# Patient Record
Sex: Male | Born: 1945 | Race: White | Hispanic: No | Marital: Married | Smoking: Current some day smoker
Health system: Southern US, Community
[De-identification: ages and names within clinical notes are randomized; demographics above are authoritative.]

## PROBLEM LIST (undated history)

## (undated) DIAGNOSIS — I2510 Atherosclerotic heart disease of native coronary artery without angina pectoris: Secondary | ICD-10-CM

## (undated) DIAGNOSIS — I4891 Unspecified atrial fibrillation: Secondary | ICD-10-CM

## (undated) DIAGNOSIS — K219 Gastro-esophageal reflux disease without esophagitis: Secondary | ICD-10-CM

## (undated) DIAGNOSIS — Z9981 Dependence on supplemental oxygen: Secondary | ICD-10-CM

## (undated) DIAGNOSIS — I10 Essential (primary) hypertension: Secondary | ICD-10-CM

## (undated) DIAGNOSIS — K649 Unspecified hemorrhoids: Secondary | ICD-10-CM

## (undated) DIAGNOSIS — C801 Malignant (primary) neoplasm, unspecified: Secondary | ICD-10-CM

## (undated) DIAGNOSIS — C3490 Malignant neoplasm of unspecified part of unspecified bronchus or lung: Secondary | ICD-10-CM

## (undated) DIAGNOSIS — Q254 Congenital malformation of aorta unspecified: Secondary | ICD-10-CM

## (undated) DIAGNOSIS — I255 Ischemic cardiomyopathy: Secondary | ICD-10-CM

## (undated) DIAGNOSIS — I509 Heart failure, unspecified: Secondary | ICD-10-CM

## (undated) DIAGNOSIS — J449 Chronic obstructive pulmonary disease, unspecified: Secondary | ICD-10-CM

## (undated) DIAGNOSIS — I82409 Acute embolism and thrombosis of unspecified deep veins of unspecified lower extremity: Secondary | ICD-10-CM

## (undated) DIAGNOSIS — I739 Peripheral vascular disease, unspecified: Secondary | ICD-10-CM

## (undated) DIAGNOSIS — I639 Cerebral infarction, unspecified: Secondary | ICD-10-CM

## (undated) DIAGNOSIS — I499 Cardiac arrhythmia, unspecified: Secondary | ICD-10-CM

## (undated) DIAGNOSIS — Z8679 Personal history of other diseases of the circulatory system: Secondary | ICD-10-CM

## (undated) HISTORY — DX: Malignant neoplasm of unspecified part of unspecified bronchus or lung: C34.90

## (undated) HISTORY — DX: Heart failure, unspecified: I50.9

## (undated) HISTORY — DX: Acute embolism and thrombosis of unspecified deep veins of unspecified lower extremity: I82.409

## (undated) HISTORY — DX: Unspecified hemorrhoids: K64.9

## (undated) HISTORY — DX: Dependence on supplemental oxygen: Z99.81

## (undated) HISTORY — DX: Unspecified atrial fibrillation: I48.91

## (undated) HISTORY — DX: Gastro-esophageal reflux disease without esophagitis: K21.9

## (undated) HISTORY — DX: Personal history of other diseases of the circulatory system: Z86.79

## (undated) HISTORY — DX: Atherosclerotic heart disease of native coronary artery without angina pectoris: I25.10

## (undated) HISTORY — PX: INSERT / REPLACE / REMOVE PACEMAKER: SUR710

## (undated) HISTORY — DX: Chronic obstructive pulmonary disease, unspecified: J44.9

## (undated) HISTORY — PX: PACEMAKER PLACEMENT: SHX43

---

## 2008-03-18 DIAGNOSIS — K5790 Diverticulosis of intestine, part unspecified, without perforation or abscess without bleeding: Secondary | ICD-10-CM

## 2008-03-18 HISTORY — DX: Diverticulosis of intestine, part unspecified, without perforation or abscess without bleeding: K57.90

## 2011-03-19 HISTORY — PX: CORONARY ANGIOGRAM: SHX5786

## 2017-12-06 ENCOUNTER — Emergency Department (HOSPITAL_COMMUNITY): Payer: Medicare HMO

## 2017-12-06 ENCOUNTER — Emergency Department (HOSPITAL_COMMUNITY)
Admission: EM | Admit: 2017-12-06 | Discharge: 2017-12-06 | Disposition: A | Discharge status: Home / Self Care | Payer: Medicare HMO | Attending: Emergency Medicine | Admitting: Emergency Medicine

## 2017-12-06 DIAGNOSIS — Z87891 Personal history of nicotine dependence: Secondary | ICD-10-CM | POA: Insufficient documentation

## 2017-12-06 DIAGNOSIS — J189 Pneumonia, unspecified organism: Secondary | ICD-10-CM | POA: Diagnosis not present

## 2017-12-06 DIAGNOSIS — I69128 Other speech and language deficits following nontraumatic intracerebral hemorrhage: Secondary | ICD-10-CM | POA: Insufficient documentation

## 2017-12-06 DIAGNOSIS — C3490 Malignant neoplasm of unspecified part of unspecified bronchus or lung: Secondary | ICD-10-CM | POA: Diagnosis not present

## 2017-12-06 DIAGNOSIS — Z950 Presence of cardiac pacemaker: Secondary | ICD-10-CM | POA: Insufficient documentation

## 2017-12-06 DIAGNOSIS — M545 Low back pain: Secondary | ICD-10-CM | POA: Diagnosis present

## 2017-12-06 DIAGNOSIS — J449 Chronic obstructive pulmonary disease, unspecified: Secondary | ICD-10-CM | POA: Diagnosis not present

## 2017-12-06 DIAGNOSIS — Z7901 Long term (current) use of anticoagulants: Secondary | ICD-10-CM | POA: Insufficient documentation

## 2017-12-06 DIAGNOSIS — J181 Lobar pneumonia, unspecified organism: Secondary | ICD-10-CM

## 2017-12-06 LAB — BASIC METABOLIC PANEL
Anion gap: 14 (ref 5–15)
BUN: 16 mg/dL (ref 8–23)
CALCIUM: 9.3 mg/dL (ref 8.9–10.3)
CO2: 25 mmol/L (ref 22–32)
Chloride: 93 mmol/L — ABNORMAL LOW (ref 98–111)
Creatinine, Ser: 1.42 mg/dL — ABNORMAL HIGH (ref 0.61–1.24)
GFR calc Af Amer: 55 mL/min — ABNORMAL LOW (ref >60)
GFR calc non Af Amer: 47 mL/min — ABNORMAL LOW (ref >60)
Glucose, Bld: 155 mg/dL — ABNORMAL HIGH (ref 70–99)
Potassium: 4.2 mmol/L (ref 3.5–5.1)
Sodium: 132 mmol/L — ABNORMAL LOW (ref 135–145)

## 2017-12-06 LAB — PROTIME-INR
INR: 1.51
Prothrombin Time: 18.1 seconds — ABNORMAL HIGH (ref 11.4–15.2)

## 2017-12-06 LAB — CBC
HCT: 38.4 % — ABNORMAL LOW (ref 39.0–52.0)
Hemoglobin: 12.3 g/dL — ABNORMAL LOW (ref 13.0–17.0)
MCH: 30.4 pg (ref 26.0–34.0)
MCHC: 32 g/dL (ref 30.0–36.0)
MCV: 95 fL (ref 78.0–100.0)
Platelets: 216 10*3/uL (ref 150–400)
RBC: 4.04 MIL/uL — AB (ref 4.22–5.81)
RDW: 13.3 % (ref 11.5–15.5)
WBC: 11.4 10*3/uL — ABNORMAL HIGH (ref 4.0–10.5)

## 2017-12-06 LAB — I-STAT TROPONIN, ED: TROPONIN I, POC: 0 ng/mL (ref 0.00–0.08)

## 2017-12-06 LAB — D-DIMER, QUANTITATIVE: D-Dimer, Quant: 1.62 ug/mL-FEU — ABNORMAL HIGH (ref 0.00–0.50)

## 2017-12-06 MED ORDER — ONDANSETRON HCL 4 MG/2ML IJ SOLN
4.0000 mg | Freq: Once | INTRAMUSCULAR | Status: AC
  Administered 2017-12-06: 4 mg via INTRAVENOUS
  Filled 2017-12-06: qty 2

## 2017-12-06 MED ORDER — OXYCODONE-ACETAMINOPHEN 5-325 MG PO TABS: 1.0000 | ORAL_TABLET | ORAL | 0 refills | Status: DC | PRN

## 2017-12-06 MED ORDER — SODIUM CHLORIDE 0.9 % IV SOLN
1.0000 g | Freq: Once | INTRAVENOUS | Status: AC
  Administered 2017-12-06: 1 g via INTRAVENOUS
  Filled 2017-12-06: qty 10

## 2017-12-06 MED ORDER — SODIUM CHLORIDE 0.9 % IV SOLN: 1.0000 g | Freq: Once | INTRAVENOUS | Status: DC

## 2017-12-06 MED ORDER — AZITHROMYCIN 250 MG PO TABS
500.0000 mg | ORAL_TABLET | Freq: Once | ORAL | Status: AC
  Administered 2017-12-06: 500 mg via ORAL
  Filled 2017-12-06: qty 2

## 2017-12-06 MED ORDER — AZITHROMYCIN 250 MG PO TABS: ORAL_TABLET | ORAL | 0 refills | Status: DC

## 2017-12-06 MED ORDER — IOPAMIDOL (ISOVUE-370) INJECTION 76%
INTRAVENOUS | Status: AC
  Filled 2017-12-06: qty 100

## 2017-12-06 MED ORDER — FENTANYL CITRATE (PF) 100 MCG/2ML IJ SOLN: 50.0000 ug | Freq: Once | INTRAMUSCULAR | Status: DC

## 2017-12-06 MED ORDER — IOPAMIDOL (ISOVUE-370) INJECTION 76%
100.0000 mL | Freq: Once | INTRAVENOUS | Status: AC | PRN
  Administered 2017-12-06: 100 mL via INTRAVENOUS

## 2017-12-06 MED ORDER — HYDROMORPHONE HCL 1 MG/ML IJ SOLN
0.5000 mg | Freq: Once | INTRAMUSCULAR | Status: AC
  Administered 2017-12-06: 0.5 mg via INTRAVENOUS
  Filled 2017-12-06: qty 1

## 2017-12-06 NOTE — ED Provider Notes (Signed)
Medical screening examination/treatment/procedure(s) were conducted as a shared visit with non-physician practitioner(s) and myself.  I personally evaluated the patient during the encounter.  EKG Interpretation  Date/Time:  Saturday December 06 2017 14:19:15 EDT Ventricular Rate:  77 PR Interval:  186 QRS Duration: 114 QT Interval:  464 QTC Calculation: 525 R Axis:   34 Text Interpretation:  Normal sinus rhythm with sinus arrhythmia Anterior infarct , age undetermined ST & Marked T-wave abnormality, consider inferolateral ischemia Prolonged QT No previous tracing Confirmed by Blanchie Dessert [phone]) on 12/06/2017 2:30:07 PM Has history of lung cancer.  Reports he has had severe pain in his left lower rib cage for several days.  He recently had a long car ride.  He is compliant with his Eliquis.  Patient has cough.  Pain is severe with cough or certain movements.  Patient is alert and appropriate.  Very thin.  Sounds very decreased on the left lower lung fields.  Heart regular.  Status clear.  Skin warm and dry.  CT rules out pulmonary embolus.  Patient does have consolidation in the left lower lung field suggestive of pneumonia.  Plan will be to treat pain that is suggestive of pleurisy.  Also will treat for pneumonia based on CT findings.  Patient is aware that he needs to follow-up with his PCP or oncology to monitor the CT findings and to monitor for any recurrence of lung cancer and resolution of identified consolidation.   Charlesetta Shanks, MD 12/06/17 1807

## 2017-12-06 NOTE — ED Provider Notes (Signed)
[facility] EMERGENCY DEPARTMENT Provider Note   CSN: [id number] Arrival date & time: 12/06/17  1329     History   Chief Complaint Chief Complaint  Patient presents with  . Back Pain    HPI Brett Saunders is a 72 y.o. male presents the emergency department with chief complaint of back pain.  Patient has a past medical history of COPD, CHF, previous stroke with mild residual balance issues and mild residual speech issues he is chronically anticoagulated on Eliquis which he is compliant with.  The patient quit smoking in 2013.  He has known and active lung cancer.  He was treated with a week of chemotherapy and has had steadily decreasing size of the mass in the left lower lobe.  He states that it is a primary and singular lesion without metastases.  The patient states that he moved from [facility] 3 weeks ago.  He has not yet established care with oncology and has a primary care appointment coming up in mid October.  Patient says that they drove across the country to get here it took 5 days.  He had a lot of discomfort during the ride and had progressively worsening pain in his left flank region.  He states that it is much worse when he tries to sit up or stand.  He said it took him 2 hours to get out of bed yesterday.  He was seen at urgent care and given baclofen and sent to the emergency department.  He denies hemoptysis, unilateral leg swelling.  He denies a previous history of PE or DVT.  He denies fevers or chills.  HPI  No past medical history on file.  There are no active problems to display for this patient.    The histories are not reviewed yet. Please review them in the "History" navigator section and refresh this [place].      Home Medications    Prior to Admission medications   Not on File    Family History No family history on file.  Social History Social History   Tobacco Use  . Smoking status: Not on file  Substance Use Topics  .  Alcohol use: Not on file  . Drug use: Not on file     Allergies   Patient has no known allergies.   Review of Systems Review of Systems  Ten systems reviewed and are negative for acute change, except as noted in the HPI.   Physical Exam Updated Vital Signs BP (!) 101/59 (BP Location: Right Arm)   Pulse 78   Temp 97.7 F (36.5 C) (Oral)   Resp 18   SpO2 97%   Physical Exam  Constitutional: He is oriented to person, place, and time. He appears well-developed.  "pink puffer" appearance of chronic COPD. Very thin and protein malnourished appearing  HENT:  Head: Normocephalic and atraumatic.  Eyes: Pupils are equal, round, and reactive to light. Conjunctivae and EOM are normal.  Neck: Normal range of motion. Neck supple. No JVD present.  Cardiovascular: Normal rate and regular rhythm. Exam reveals no friction rub.  No murmur heard. Pacemaker visible in the R upper chest wall   Pulmonary/Chest: Effort normal and breath sounds normal.  Abdominal: Soft. Bowel sounds are normal. He exhibits no distension. There is no tenderness.  No CVA tenderness  Musculoskeletal: He exhibits no edema.  Patient with some dextroscoliosis of the thoracic and lumbar region on examination.  No midline spinal tenderness, no reproducible pain with palpation  of the supporting musculature of the back.  Patient has obvious pain when going from lying to sitting.    Neurological: He is alert and oriented to person, place, and time.  Patient appears to be well informed and have good insight into his medical history and disease.  Skin: Skin is warm and dry.  Nursing note and vitals reviewed.    ED Treatments / Results  Labs (all labs ordered are listed, but only abnormal results are displayed) Labs Reviewed  BASIC METABOLIC PANEL - Abnormal; Notable for the following components:      Result Value   Sodium 132 (*)    Chloride 93 (*)    Glucose, Bld 155 (*)    Creatinine, Ser 1.42 (*)    GFR calc non  Af Amer 47 (*)    GFR calc Af Amer 55 (*)    All other components within normal limits  CBC - Abnormal; Notable for the following components:   WBC 11.4 (*)    RBC 4.04 (*)    Hemoglobin 12.3 (*)    HCT 38.4 (*)    All other components within normal limits  PROTIME-INR - Abnormal; Notable for the following components:   Prothrombin Time 18.1 (*)    All other components within normal limits  D-DIMER, QUANTITATIVE (NOT AT [facility]) - Abnormal; Notable for the following components:   D-Dimer, Quant 1.62 (*)    All other components within normal limits  I-STAT TROPONIN, ED    EKG EKG Interpretation  Date/Time:  Saturday December 06 2017 14:19:15 EDT Ventricular Rate:  77 PR Interval:  186 QRS Duration: 114 QT Interval:  464 QTC Calculation: 525 R Axis:   34 Text Interpretation:  Normal sinus rhythm with sinus arrhythmia Anterior infarct , age undetermined ST & Marked T-wave abnormality, consider inferolateral ischemia Prolonged QT No previous tracing Confirmed by Blanchie Dessert [phone]) on 12/06/2017 2:30:07 PM   Radiology Dg Chest 2 View  Result Date: 12/06/2017 CLINICAL DATA:  Pt states he just moved across country, 10 hour drive. He has pain to bilateral mid back/lower ribs that is worse with deep breaths. He has a hx of afib, COPD, lung cancer and CHF. Denies CP or SOB EXAM: CHEST - 2 VIEW COMPARISON:  None. FINDINGS: Cardiac silhouette is normal in size. No mediastinal or hilar masses. No evidence of adenopathy. Left anterior chest wall AICD appears well positioned. Lungs are hyperexpanded. There is opacity at the left lung base associated with a small effusion. There is scarring in the upper lobes. No other areas of lung consolidation. No pulmonary edema. No pneumothorax. Mild compression deformity of a lower thoracic vertebra, likely chronic. Skeletal structures are diffusely demineralized. IMPRESSION: 1. Left lower lobe consolidation associated with a small effusion. Findings are  consistent with pneumonia in the proper clinical setting. Alternatively, lung opacity may be due to scarring or atelectasis. 2. No other evidence of an acute abnormality. 3. Advanced COPD. Electronically Signed   By: Lajean Manes M.D.   On: 12/06/2017 14:43    Procedures Procedures (including critical care time)  Medications Ordered in ED Medications - No data to display   Initial Impression / Assessment and Plan / ED Course  I have reviewed the triage vital signs and the nursing notes.  Pertinent labs & imaging results that were available during my care of the patient were reviewed by me and considered in my medical decision making (see chart for details).     72 year old male with extensive past  medical history new to the area.  He has known cancer.  D-dimer elevated in the front without other significant abnormality.  Patient appears to have some mild renal insufficiency elevated blood glucose.  White cell count is slightly elevated.  Patient had a negative chest x-ray for any acute abnormalities.  He does have some chronic cardiomegaly seen.  Patient's CT angios shows no pulmonary embolus however he does appear to have left lower lobe pneumonia which is likely the cause of the pain of his symptoms.  No evidence of pathologic fracture or pulmonary embolus.  Patient will be treated with antibiotics and outpatient follow-up.  This is the same region where he was reported to have a cancer and the patient will need outpatient oncologic follow-up.  Patient seen and shared visit with Dr. Vallery Ridge who agrees with work-up and plan for discharge.  I discussed return precautions with the PA patient.  Pain improved with medications here.   Patient appears appropriate for discharge at this time  Final Clinical Impressions(s) / ED Diagnoses   Final diagnoses:  Community acquired pneumonia of left lower lobe of lung [facility])    ED Discharge Orders    None       Margarita Mail, PA-C 12/06/17  2348    Charlesetta Shanks, MD 12/07/17 0000

## 2017-12-06 NOTE — ED Notes (Signed)
Patient Alert and oriented to baseline. Stable and ambulatory to baseline. Patient verbalized understanding of the discharge instructions.  Patient belongings were taken by the patient.   

## 2017-12-06 NOTE — Discharge Instructions (Addendum)
Contact a health care provider if: You have a fever. You are losing sleep because you cannot control your cough with cough medicine. Get help right away if: You have worsening shortness of breath. You have increased chest pain. Your sickness becomes worse, especially if you are an older adult or have a weakened immune system. You cough up blood.

## 2017-12-06 NOTE — ED Triage Notes (Signed)
Pt states he just moved across country, 10 hour drive. He has pain to bilateral mid back/lower ribs that is worse with deep breaths. He has a hx of afib, his skin color is grey. He smells of ETOH. Denies CP or SOB. Denies N/V/D or urinary symptoms.

## 2018-02-26 ENCOUNTER — Telehealth (HOSPITAL_COMMUNITY): Payer: Self-pay | Admitting: *Deleted

## 2018-02-26 NOTE — Telephone Encounter (Signed)
Received [facility] Authorization/Referral ([id number]) dates 02/23/18 to 06/23/2018 for this pt to participate in pulmonary rehab with the diagnosis of COPD. Clinical review of pt follow up appt on 12/6 with Dr Gwenette Greet at [facility]  Pulmonary office note.   Pt appropriate for scheduling for Pulmonary rehab.  Will forward to support staff for scheduling. Cherre Huger, BSN Cardiac and Training and development officer

## 2018-03-06 ENCOUNTER — Telehealth (HOSPITAL_COMMUNITY): Payer: Self-pay

## 2018-03-06 NOTE — Telephone Encounter (Signed)
Called patient to see if he was interested in participating in the Pulmonary Rehab Program. Patient stated yes. Patient will come in for orientation on 03/13/18 @ 930AM and will attend the 1030AM exercise class.  Mailed homework package.

## 2018-03-09 ENCOUNTER — Telehealth (HOSPITAL_COMMUNITY): Payer: Self-pay

## 2018-03-13 ENCOUNTER — Encounter (HOSPITAL_COMMUNITY): Payer: Self-pay

## 2018-03-13 ENCOUNTER — Encounter (HOSPITAL_COMMUNITY)
Admission: RE | Admit: 2018-03-13 | Discharge: 2018-03-13 | Disposition: A | Discharge status: Home / Self Care | Payer: No Typology Code available for payment source | Source: Ambulatory Visit | Attending: Pulmonary Disease | Admitting: Pulmonary Disease

## 2018-03-13 VITALS — BP 103/66 | HR 63 | Temp 97.8°F | Resp 18 | Ht 68.75 in | Wt 117.1 lb

## 2018-03-13 DIAGNOSIS — I255 Ischemic cardiomyopathy: Secondary | ICD-10-CM | POA: Insufficient documentation

## 2018-03-13 DIAGNOSIS — I10 Essential (primary) hypertension: Secondary | ICD-10-CM | POA: Insufficient documentation

## 2018-03-13 DIAGNOSIS — F17210 Nicotine dependence, cigarettes, uncomplicated: Secondary | ICD-10-CM | POA: Insufficient documentation

## 2018-03-13 DIAGNOSIS — J449 Chronic obstructive pulmonary disease, unspecified: Secondary | ICD-10-CM | POA: Insufficient documentation

## 2018-03-13 DIAGNOSIS — I700 Atherosclerosis of aorta: Secondary | ICD-10-CM | POA: Insufficient documentation

## 2018-03-13 DIAGNOSIS — Z79899 Other long term (current) drug therapy: Secondary | ICD-10-CM | POA: Insufficient documentation

## 2018-03-13 DIAGNOSIS — I4891 Unspecified atrial fibrillation: Secondary | ICD-10-CM | POA: Insufficient documentation

## 2018-03-13 DIAGNOSIS — Z7901 Long term (current) use of anticoagulants: Secondary | ICD-10-CM | POA: Insufficient documentation

## 2018-03-13 HISTORY — DX: Chronic obstructive pulmonary disease, unspecified: J44.9

## 2018-03-13 HISTORY — DX: Congenital malformation of aorta unspecified: Q25.40

## 2018-03-13 HISTORY — DX: Essential (primary) hypertension: I10

## 2018-03-13 HISTORY — DX: Peripheral vascular disease, unspecified: I73.9

## 2018-03-13 HISTORY — DX: Ischemic cardiomyopathy: I25.5

## 2018-03-13 HISTORY — DX: Cardiac arrhythmia, unspecified: I49.9

## 2018-03-13 HISTORY — DX: Cerebral infarction, unspecified: I63.9

## 2018-03-13 HISTORY — DX: Malignant (primary) neoplasm, unspecified: C80.1

## 2018-03-13 NOTE — Progress Notes (Addendum)
Brett Saunders 72 y.o. male Pulmonary Rehab Orientation Note "Brett Saunders" referred to pulmonary rehab by Dr. Gwenette Greet at [facility]. Pt has authorization [id number] for COPD. Patient arrived today in Cardiac and Pulmonary Rehab for orientation to Pulmonary Rehab. He was transported from [employer] via wheel chair. He does not carry portable oxygen. Per pt, he uses oxygen at night with sleep and occasionally during the day when he feels like he "needs it." Color good, skin warm and dry. Patient is oriented to time and place. Patient's medical history, psychosocial health, and medications reviewed. Psychosocial assessment reveals pt lives with their spouse and lives next door to his grand-daughter. Pt is currently retired. Pt hobbies include yard work and playing with his dog. Pt reports his stress level is moderate. Areas of stress/anxiety include Health Family.  Pt does not exhibit signs of depression. Pt states he chronically has trouble staying asleep, but this has improved since he moved to [place]. PHQ2/9 score 0/6. Pt shows good  coping skills with positive outlook . Offered emotional support and reassurance. Will continue to monitor and evaluate progress toward psychosocial goal(s) of maintaining a positive outlook on life and utilizing good coping skills to deal with life stressors. Physical assessment reveals heart rate is 63 and regular, breath sounds clear to auscultation, no wheezes, rales, or rhonchi, and diminished throughout. Grip strength equal, strong. Pt has history of Raynaud's disease and upon assessment fingertips were mildly cyanotic. Pt was able to sit on his hand and rub his arms and fingertips did pink up. Distal pulses 2+. Patient reports he does take medications as prescribed. Patient states he follows a Regular diet, but has had a poor appetite the past year, but states he does drink 2 cans of ensure everyday and eats breakfast and dinner usually. The pt reports that he would like to gain  weight as he states he has pain in his ribs becasue he is " so thin.".. Patient's weight will be monitored closely. Demonstration and practice of PLB. Patient able to return demonstration satisfactorily. Safety and hand hygiene in the exercise area reviewed with patient. Patient voices understanding of the information reviewed. Department expectations discussed with patient and achievable goals were set. The patient shows enthusiasm about attending the program and we look forward to working with this nice gentleman. The patient is scheduled for a 6 min walk test on 03/17/18 at 1200 and to begin exercise on 03/24/18 with the 1030 class. 45 minutes was spent on a variety of activities such as assessment of the patient, obtaining baseline data including height, weight, BMI, and grip strength, verifying medical history, allergies, and current medications, and teaching patient strategies for performing tasks with less respiratory effort with emphasis on pursed lip breathing.  [id number]  Joycelyn Man RN, BSN Cardiac and Pulmonary Rehab RN

## 2018-03-16 ENCOUNTER — Encounter (HOSPITAL_COMMUNITY)
Admission: RE | Admit: 2018-03-16 | Discharge: 2018-03-16 | Disposition: A | Discharge status: Home / Self Care | Payer: No Typology Code available for payment source | Source: Ambulatory Visit | Attending: Pulmonary Disease | Admitting: Pulmonary Disease

## 2018-03-16 ENCOUNTER — Ambulatory Visit (HOSPITAL_COMMUNITY): Payer: Non-veteran care

## 2018-03-16 DIAGNOSIS — I4891 Unspecified atrial fibrillation: Secondary | ICD-10-CM | POA: Diagnosis not present

## 2018-03-16 DIAGNOSIS — J449 Chronic obstructive pulmonary disease, unspecified: Secondary | ICD-10-CM

## 2018-03-16 DIAGNOSIS — I700 Atherosclerosis of aorta: Secondary | ICD-10-CM | POA: Diagnosis not present

## 2018-03-16 DIAGNOSIS — I10 Essential (primary) hypertension: Secondary | ICD-10-CM | POA: Diagnosis not present

## 2018-03-16 DIAGNOSIS — I255 Ischemic cardiomyopathy: Secondary | ICD-10-CM | POA: Diagnosis not present

## 2018-03-16 DIAGNOSIS — F17210 Nicotine dependence, cigarettes, uncomplicated: Secondary | ICD-10-CM | POA: Diagnosis not present

## 2018-03-16 DIAGNOSIS — Z79899 Other long term (current) drug therapy: Secondary | ICD-10-CM | POA: Diagnosis not present

## 2018-03-16 DIAGNOSIS — Z7901 Long term (current) use of anticoagulants: Secondary | ICD-10-CM | POA: Diagnosis not present

## 2018-03-17 ENCOUNTER — Ambulatory Visit (HOSPITAL_COMMUNITY): Payer: Non-veteran care

## 2018-03-17 NOTE — Progress Notes (Signed)
Pulmonary Individual Treatment Plan  Patient Details  Name: Brett Saunders MRN: [id number] Date of Birth: 02/05/1946 Referring Provider:     Pulmonary Rehab Walk Test from 03/16/2018 in [facility]  Referring Provider  Dr. Yacoub      Initial Encounter Date:    Pulmonary Rehab Walk Test from 03/16/2018 in [facility]  Date  03/16/18      Visit Diagnosis: Chronic obstructive pulmonary disease, unspecified COPD type (HCC)  Patient's Home Medications on Admission:   Current Outpatient Medications:  .  acetaminophen (TYLENOL) 500 MG tablet, Take 500 mg by mouth 2 (two) times daily., Disp: , Rfl:  .  amiodarone (PACERONE) 200 MG tablet, Take 100 mg by mouth daily. , Disp: , Rfl:  .  apixaban (ELIQUIS) 5 MG TABS tablet, Take 2.5 mg by mouth 2 (two) times daily., Disp: , Rfl:  .  azithromycin (ZITHROMAX Z-PAK) 250 MG tablet, 2 po day one, then 1 daily x 4 days (Patient not taking: Reported on 03/13/2018), Disp: 5 tablet, Rfl: 0 .  carvedilol (COREG) 3.125 MG tablet, Take 3.125 mg by mouth 2 (two) times daily with a meal., Disp: , Rfl:  .  diphenhydrAMINE (BENADRYL) 25 MG tablet, Take 25 mg by mouth at bedtime., Disp: , Rfl:  .  furosemide (LASIX) 20 MG tablet, Take 20 mg by mouth daily. , Disp: , Rfl:  .  Glycopyrrolate-Formoterol (BEVESPI AEROSPHERE) 9-4.8 MCG/ACT AERO, Inhale 2 puffs into the lungs 2 (two) times daily., Disp: , Rfl:  .  Liniments (SALONPAS PAIN RELIEF PATCH EX), Place 1 patch onto the skin daily as needed (pain)., Disp: , Rfl:  .  OLODATEROL HCL IN, Inhale 2.5 mg into the lungs every morning. 2 puffs, Disp: , Rfl:  .  oxyCODONE-acetaminophen (PERCOCET) 5-325 MG tablet, Take 1-2 tablets by mouth every 4 (four) hours as needed. (Patient not taking: Reported on 03/13/2018), Disp: 20 tablet, Rfl: 0 .  rOPINIRole (REQUIP) 0.25 MG tablet, Take 0.25 mg by mouth at bedtime. , Disp: , Rfl:  .  sodium chloride 1 g  tablet, Take 1 g by mouth 3 (three) times daily., Disp: , Rfl:   Past Medical History: Past Medical History:  Diagnosis Date  . Aortic anomaly    aortic calcification  . Arrhythmia    a fib  . Cancer (HCC)    lung  . Chronic obstructive lung disease (HCC)   . Hypertension   . Ischemic cardiomyopathy   . Peripheral vascular disease (HCC)   . Stroke (HCC)    oct 2012    Tobacco Use: Social History   Tobacco Use  Smoking Status Light Tobacco Smoker  . Packs/day: 0.10  . Types: Cigarettes  Smokeless Tobacco Never Used  Tobacco Comment   pt smokes 1 cigarrete occasionally. 1 pack "last a few months"    Labs: Recent Review Flowsheet Data    There is no flowsheet data to display.      Capillary Blood Glucose: No results found for: GLUCAP   Pulmonary Assessment Scores:   Pulmonary Function Assessment:   Exercise Target Goals: Exercise Program Goal: Individual exercise prescription set using results from initial 6 min walk test and THRR while considering  patient's activity barriers and safety.   Exercise Prescription Goal: Initial exercise prescription builds to 30-45 minutes a day of aerobic activity, 2-3 days per week.  Home exercise guidelines will be given to patient during program as part of exercise prescription that the   participant will acknowledge.  Activity Barriers & Risk Stratification: Activity Barriers & Cardiac Risk Stratification - 03/13/18 1203      Activity Barriers & Cardiac Risk Stratification   Activity Barriers  Back Problems;Balance Concerns;Shortness of Breath       6 Minute Walk: 6 Minute Walk    Row Name 03/17/18 0652         6 Minute Walk   Distance  1085 feet     Walk Time  6 minutes     # of Rest Breaks  0     MPH  2.05     METS  2.61     RPE  12     Perceived Dyspnea   3     Symptoms  No     Resting HR  64 bpm     Resting BP  114/64     Resting Oxygen Saturation   100 %     Exercise Oxygen Saturation  during 6 min  walk  94 %     Max Ex. HR  106 bpm     Max Ex. BP  122/62     2 Minute Post BP  116/64       Interval HR   1 Minute HR  97     2 Minute HR  106     3 Minute HR  104     4 Minute HR  105     5 Minute HR  105     6 Minute HR  104     2 Minute Post HR  88     Interval Heart Rate?  Yes       Interval Oxygen   Interval Oxygen?  Yes     Baseline Oxygen Saturation %  100 %     1 Minute Oxygen Saturation %  94 %     1 Minute Liters of Oxygen  0 L     2 Minute Oxygen Saturation %  94 %     2 Minute Liters of Oxygen  0 L     3 Minute Oxygen Saturation %  94 %     3 Minute Liters of Oxygen  0 L     4 Minute Oxygen Saturation %  96 %     4 Minute Liters of Oxygen  0 L     5 Minute Oxygen Saturation %  98 %     5 Minute Liters of Oxygen  0 L     6 Minute Oxygen Saturation %  98 %     6 Minute Liters of Oxygen  0 L     2 Minute Post Oxygen Saturation %  100 %     2 Minute Post Liters of Oxygen  0 L        Oxygen Initial Assessment: Oxygen Initial Assessment - 03/17/18 0651      Home Oxygen   Home Oxygen Device  Portable Concentrator;Home Concentrator    Sleep Oxygen Prescription  Continuous    Liters per minute  2    Home Exercise Oxygen Prescription  None    Home at Rest Exercise Oxygen Prescription  None    Compliance with Home Oxygen Use  Yes      Initial 6 min Walk   Oxygen Used  None      Program Oxygen Prescription   Program Oxygen Prescription  None      Intervention   Short Term Goals  To learn  and exhibit compliance with exercise, home and travel O2 prescription;To learn and understand importance of maintaining oxygen saturations>88%;To learn and demonstrate proper use of respiratory medications;To learn and understand importance of monitoring SPO2 with pulse oximeter and demonstrate accurate use of the pulse oximeter.;To learn and demonstrate proper pursed lip breathing techniques or other breathing techniques.    Long  Term Goals  Exhibits compliance with exercise,  home and travel O2 prescription;Verbalizes importance of monitoring SPO2 with pulse oximeter and return demonstration;Maintenance of O2 saturations>88%;Exhibits proper breathing techniques, such as pursed lip breathing or other method taught during program session;Compliance with respiratory medication;Demonstrates proper use of MDI's       Oxygen Re-Evaluation:   Oxygen Discharge (Final Oxygen Re-Evaluation):   Initial Exercise Prescription: Initial Exercise Prescription - 03/17/18 0600      Date of Initial Exercise RX and Referring Provider   Date  03/16/18    Referring Provider  Dr. Nelda Marseille      Bike   Level  0.4    Minutes  17      NuStep   Level  2    SPM  80    Minutes  17      Track   Laps  10    Minutes  17      Prescription Details   Frequency (times per week)  2    Duration  Progress to 45 minutes of aerobic exercise without signs/symptoms of physical distress      Intensity   THRR 40-80% of Max Heartrate  58-117    Ratings of Perceived Exertion  11-13    Perceived Dyspnea  0-4      Progression   Progression  Continue to progress workloads to maintain intensity without signs/symptoms of physical distress.      Resistance Training   Training Prescription  Yes    Weight  orange bands    Reps  10-15       Perform Capillary Blood Glucose checks as needed.  Exercise Prescription Changes:   Exercise Comments:   Exercise Goals and Review: Exercise Goals    Row Name 03/13/18 1208             Exercise Goals   Increase Physical Activity  Yes       Intervention  Provide advice, education, support and counseling about physical activity/exercise needs.;Develop an individualized exercise prescription for aerobic and resistive training based on initial evaluation findings, risk stratification, comorbidities and participant's personal goals.       Expected Outcomes  Short Term: Attend rehab on a regular basis to increase amount of physical activity.;Long  Term: Add in home exercise to make exercise part of routine and to increase amount of physical activity.;Long Term: Exercising regularly at least 3-5 days a week.       Increase Strength and Stamina  Yes       Intervention  Provide advice, education, support and counseling about physical activity/exercise needs.;Develop an individualized exercise prescription for aerobic and resistive training based on initial evaluation findings, risk stratification, comorbidities and participant's personal goals.       Expected Outcomes  Short Term: Increase workloads from initial exercise prescription for resistance, speed, and METs.;Short Term: Perform resistance training exercises routinely during rehab and add in resistance training at home;Long Term: Improve cardiorespiratory fitness, muscular endurance and strength as measured by increased METs and functional capacity (6MWT)       Able to understand and use rate of perceived exertion (RPE) scale  Yes  Intervention  Provide education and explanation on how to use RPE scale       Expected Outcomes  Short Term: Able to use RPE daily in rehab to express subjective intensity level;Long Term:  Able to use RPE to guide intensity level when exercising independently       Able to understand and use Dyspnea scale  Yes       Intervention  Provide education and explanation on how to use Dyspnea scale       Expected Outcomes  Short Term: Able to use Dyspnea scale daily in rehab to express subjective sense of shortness of breath during exertion;Long Term: Able to use Dyspnea scale to guide intensity level when exercising independently       Knowledge and understanding of Target Heart Rate Range (THRR)  Yes       Intervention  Provide education and explanation of THRR including how the numbers were predicted and where they are located for reference       Expected Outcomes  Short Term: Able to state/look up THRR;Long Term: Able to use THRR to govern intensity when  exercising independently;Short Term: Able to use daily as guideline for intensity in rehab       Understanding of Exercise Prescription  Yes       Intervention  Provide education, explanation, and written materials on patient's individual exercise prescription       Expected Outcomes  Short Term: Able to explain program exercise prescription;Long Term: Able to explain home exercise prescription to exercise independently       Improve claudication pain toleration; Improve walking ability  Yes       Intervention  Participate in PAD/SET Rehab 2-3 days a week, walking at home as part of exercise prescription;Attend education sessions to aid in risk factor modification and understanding of disease process       Expected Outcomes  Short Term: Improve walking distance/time to onset of claudication pain;Long Term: Improve walking ability and toleration to claudication;Long Term: Improve score of PAD questionnaires          Exercise Goals Re-Evaluation :   Discharge Exercise Prescription (Final Exercise Prescription Changes):   Nutrition:  Target Goals: Understanding of nutrition guidelines, daily intake of sodium 1500mg , cholesterol 200mg , calories 30% from fat and 7% or less from saturated fats, daily to have 5 or more servings of fruits and vegetables.  Biometrics: Pre Biometrics - 03/13/18 1204      Pre Biometrics   Grip Strength  30 kg        Nutrition Therapy Plan and Nutrition Goals:   Nutrition Assessments:   Nutrition Goals Re-Evaluation:   Nutrition Goals Discharge (Final Nutrition Goals Re-Evaluation):   Psychosocial: Target Goals: Acknowledge presence or absence of significant depression and/or stress, maximize coping skills, provide positive support system. Participant is able to verbalize types and ability to use techniques and skills needed for reducing stress and depression.  Initial Review & Psychosocial Screening: Initial Psych Review & Screening - 03/13/18 1212       Initial Review   Current issues with  None Identified      Family Dynamics   Good Support System?  Yes      Barriers   Psychosocial barriers to participate in program  There are no identifiable barriers or psychosocial needs.;The patient should benefit from training in stress management and relaxation.      Screening Interventions   Interventions  Encouraged to exercise;To provide support and resources with identified psychosocial  needs    Expected Outcomes  Long Term Goal: Stressors or current issues are controlled or eliminated.;Short Term goal: Identification and review with participant of any Quality of Life or Depression concerns found by scoring the questionnaire.;Long Term goal: The participant improves quality of Life and PHQ9 Scores as seen by post scores and/or verbalization of changes       Quality of Life Scores:  Scores of 19 and below usually indicate a poorer quality of life in these areas.  A difference of  2-3 points is a clinically meaningful difference.  A difference of 2-3 points in the total score of the Quality of Life Index has been associated with significant improvement in overall quality of life, self-image, physical symptoms, and general health in studies assessing change in quality of life.   PHQ-9: Recent Review Flowsheet Data    Depression screen [facility] 2/9 03/13/2018 03/13/2018   Decreased Interest 0 0   Down, Depressed, Hopeless 0 0   PHQ - 2 Score 0 0   Altered sleeping 0 0   Tired, decreased energy 3 3   Change in appetite 3 3   Feeling bad or failure about yourself  0 0   Trouble concentrating 0 0   Moving slowly or fidgety/restless 0 0   Suicidal thoughts 0 0   PHQ-9 Score 6 6   Difficult doing work/chores Somewhat difficult Somewhat difficult     Interpretation of Total Score  Total Score Depression Severity:  1-4 = Minimal depression, 5-9 = Mild depression, 10-14 = Moderate depression, 15-19 = Moderately severe depression, 20-27 =  Severe depression   Psychosocial Evaluation and Intervention: Psychosocial Evaluation - 03/13/18 1215      Psychosocial Evaluation & Interventions   Interventions  Stress management education;Relaxation education;Encouraged to exercise with the program and follow exercise prescription    Comments  Pt with recewnt move from [place] to [place]. good support from wife and grandaughter.    Expected Outcomes  Pt will continue to display positve and healthy coping skills by his family    Continue Psychosocial Services   Follow up required by staff       Psychosocial Re-Evaluation:   Psychosocial Discharge (Final Psychosocial Re-Evaluation):    Education: Education Goals: Education classes will be provided on a weekly basis, covering required topics. Participant will state understanding/return demonstration of topics presented.  Learning Barriers/Preferences: Learning Barriers/Preferences - 03/13/18 1218      Learning Barriers/Preferences   Learning Barriers  Sight;Hearing    Learning Preferences  Written Material;Computer/Internet;Group Instruction       Education Topics: How Lungs Work and Diseases: - Discuss the anatomy of the lungs and diseases that can affect the lungs, such as COPD.   Exercise: -Discuss the importance of exercise, FITT principles of exercise, normal and abnormal responses to exercise, and how to exercise safely.   Environmental Irritants: -Discuss types of environmental irritants and how to limit exposure to environmental irritants.   Meds/Inhalers and oxygen: - Discuss respiratory medications, definition of an inhaler and oxygen, and the proper way to use an inhaler and oxygen.   Energy Saving Techniques: - Discuss methods to conserve energy and decrease shortness of breath when performing activities of daily living.    Bronchial Hygiene / Breathing Techniques: - Discuss breathing mechanics, pursed-lip breathing technique,  proper posture, effective ways  to clear airways, and other functional breathing techniques   Cleaning Equipment: - Provides group verbal and written instruction about the health risks of elevated stress, cause  of high stress, and healthy ways to reduce stress.   Nutrition I: Fats: - Discuss the types of cholesterol, what cholesterol does to the body, and how cholesterol levels can be controlled.   Nutrition II: Labels: -Discuss the different components of food labels and how to read food labels.   Respiratory Infections: - Discuss the signs and symptoms of respiratory infections, ways to prevent respiratory infections, and the importance of seeking medical treatment when having a respiratory infection.   Stress I: Signs and Symptoms: - Discuss the causes of stress, how stress may lead to anxiety and depression, and ways to limit stress.   Stress II: Relaxation: -Discuss relaxation techniques to limit stress.   Oxygen for Home/Travel: - Discuss how to prepare for travel when on oxygen and proper ways to transport and store oxygen to ensure safety.   Knowledge Questionnaire Score:   Core Components/Risk Factors/Patient Goals at Admission: Personal Goals and Risk Factors at Admission - 03/13/18 1219      Core Components/Risk Factors/Patient Goals on Admission    Weight Management  Weight Gain    Tobacco Cessation  Yes    Number of packs per day  vaping and occasional cigarette use    Intervention  Assist the participant in steps to quit. Provide individualized education and counseling about committing to Tobacco Cessation, relapse prevention, and pharmacological support that can be provided by physician.;Advice worker, assist with locating and accessing local/national Quit Smoking programs, and support quit date choice.    Expected Outcomes  Short Term: Will demonstrate readiness to quit, by selecting a quit date.;Short Term: Will quit all tobacco product use, adhering to prevention of  relapse plan.    Improve shortness of breath with ADL's  Yes    Intervention  Provide education, individualized exercise plan and daily activity instruction to help decrease symptoms of SOB with activities of daily living.    Expected Outcomes  Short Term: Improve cardiorespiratory fitness to achieve a reduction of symptoms when performing ADLs;Long Term: Be able to perform more ADLs without symptoms or delay the onset of symptoms    Stress  Yes    Intervention  Offer individual and/or small group education and counseling on adjustment to heart disease, stress management and health-related lifestyle change. Teach and support self-help strategies.;Refer participants experiencing significant psychosocial distress to appropriate mental health specialists for further evaluation and treatment. When possible, include family members and significant others in education/counseling sessions.    Expected Outcomes  Short Term: Participant demonstrates changes in health-related behavior, relaxation and other stress management skills, ability to obtain effective social support, and compliance with psychotropic medications if prescribed.;Long Term: Emotional wellbeing is indicated by absence of clinically significant psychosocial distress or social isolation.       Core Components/Risk Factors/Patient Goals Review:  Goals and Risk Factor Review    Row Name 03/13/18 1222             Core Components/Risk Factors/Patient Goals Review   Personal Goals Review  Weight Management/Obesity;Tobacco Cessation;Improve shortness of breath with ADL's;Develop more efficient breathing techniques such as purse lipped breathing and diaphragmatic breathing and practicing self-pacing with activity.;Increase knowledge of respiratory medications and ability to use respiratory devices properly.;Stress          Core Components/Risk Factors/Patient Goals at Discharge (Final Review):  Goals and Risk Factor Review - 03/13/18 1222       Core Components/Risk Factors/Patient Goals Review   Personal Goals Review  Weight Management/Obesity;Tobacco Cessation;Improve shortness of breath  with ADL's;Develop more efficient breathing techniques such as purse lipped breathing and diaphragmatic breathing and practicing self-pacing with activity.;Increase knowledge of respiratory medications and ability to use respiratory devices properly.;Stress       ITP Comments: ITP Comments    Row Name 03/13/18 1126           ITP Comments  Dr. Manfred Arch medical director for pulmonary rehab.          Comments:

## 2018-03-24 ENCOUNTER — Encounter (HOSPITAL_COMMUNITY)
Admission: RE | Admit: 2018-03-24 | Discharge: 2018-03-24 | Disposition: A | Discharge status: Home / Self Care | Payer: No Typology Code available for payment source | Source: Ambulatory Visit | Attending: Pulmonary Disease | Admitting: Pulmonary Disease

## 2018-03-24 VITALS — Wt 118.2 lb

## 2018-03-24 DIAGNOSIS — I700 Atherosclerosis of aorta: Secondary | ICD-10-CM | POA: Diagnosis not present

## 2018-03-24 DIAGNOSIS — Z7901 Long term (current) use of anticoagulants: Secondary | ICD-10-CM | POA: Insufficient documentation

## 2018-03-24 DIAGNOSIS — I255 Ischemic cardiomyopathy: Secondary | ICD-10-CM | POA: Insufficient documentation

## 2018-03-24 DIAGNOSIS — I4891 Unspecified atrial fibrillation: Secondary | ICD-10-CM | POA: Insufficient documentation

## 2018-03-24 DIAGNOSIS — J449 Chronic obstructive pulmonary disease, unspecified: Secondary | ICD-10-CM | POA: Insufficient documentation

## 2018-03-24 DIAGNOSIS — I10 Essential (primary) hypertension: Secondary | ICD-10-CM | POA: Diagnosis not present

## 2018-03-24 DIAGNOSIS — Z79899 Other long term (current) drug therapy: Secondary | ICD-10-CM | POA: Diagnosis not present

## 2018-03-24 DIAGNOSIS — F17210 Nicotine dependence, cigarettes, uncomplicated: Secondary | ICD-10-CM | POA: Insufficient documentation

## 2018-03-24 NOTE — Progress Notes (Signed)
Daily Session Note  Patient Details  Name: Brett Saunders MRN: [id number] Date of Birth: 11/16/1945 Referring Provider:     Pulmonary Rehab Walk Test from 03/16/2018 in [facility]  Referring Provider  Dr. Yacoub      Encounter Date: 03/24/2018  Check In: Session Check In - 03/24/18 1238      Check-In   Supervising physician immediately available to respond to emergencies  [facility] immediately available    Physician(s)  Dr. Rai    Location  [facility]    Staff Present  Joan Behrens, RN, BSN;Carlette Carlton, RN, BSN;Dalton Fletcher, MS, Exercise Physiologist; , RN;Annedrea Stackhouse, RN, MHA    Medication changes reported      No    Fall or balance concerns reported     No    Tobacco Cessation  No Change    Warm-up and Cool-down  Performed as group-led instruction    Resistance Training Performed  Yes    VAD Patient?  No    PAD/SET Patient?  No      Pain Assessment   Currently in Pain?  No/denies    Pain Score  0-No pain    Multiple Pain Sites  No       Capillary Blood Glucose: No results found for this or any previous visit (from the past 24 hour(s)).  Exercise Prescription Changes - 03/24/18 1400      Response to Exercise   Blood Pressure (Admit)  106/62    Blood Pressure (Exercise)  118/60    Blood Pressure (Exit)  92/62    Heart Rate (Admit)  76 bpm    Heart Rate (Exercise)  110 bpm    Heart Rate (Exit)  85 bpm    Oxygen Saturation (Admit)  100 %    Oxygen Saturation (Exercise)  95 %    Oxygen Saturation (Exit)  95 %    Rating of Perceived Exertion (Exercise)  15    Perceived Dyspnea (Exercise)  2    Duration  Progress to 45 minutes of aerobic exercise without signs/symptoms of physical distress    Intensity  Other (comment)   40-80% of HRR     Progression   Progression  Continue to progress workloads to maintain intensity without signs/symptoms of physical distress.      Resistance  Training   Training Prescription  Yes    Weight  orange bands    Reps  10-15    Time  10 Minutes      Bike   Level  0.3    Minutes  17      NuStep   Level  2    SPM  80    Minutes  17    METs  2.1      Track   Laps  10    Minutes  17       Social History   Tobacco Use  Smoking Status Light Tobacco Smoker  . Packs/day: 0.10  . Types: Cigarettes  Smokeless Tobacco Never Used  Tobacco Comment   pt smokes 1 cigarrete occasionally. 1 pack "last a few months"    Goals Met:  Exercise tolerated well No report of cardiac concerns or symptoms Strength training completed today  Goals Unmet:  Not Applicable  Comments: Service time is from 1030 to 1210    Dr. Wesam G. Yacoub is Medical Director for Pulmonary Rehab at [facility]. 

## 2018-03-25 NOTE — Progress Notes (Signed)
Pulmonary Individual Treatment Plan  Patient Details  Name: Brett Saunders MRN: [id number] Date of Birth: 02/05/1946 Referring Provider:     Pulmonary Rehab Walk Test from 03/16/2018 in [facility]  Referring Provider  Dr. Yacoub      Initial Encounter Date:    Pulmonary Rehab Walk Test from 03/16/2018 in [facility]  Date  03/16/18      Visit Diagnosis: Chronic obstructive pulmonary disease, unspecified COPD type (HCC)  Patient's Home Medications on Admission:   Current Outpatient Medications:  .  acetaminophen (TYLENOL) 500 MG tablet, Take 500 mg by mouth 2 (two) times daily., Disp: , Rfl:  .  amiodarone (PACERONE) 200 MG tablet, Take 100 mg by mouth daily. , Disp: , Rfl:  .  apixaban (ELIQUIS) 5 MG TABS tablet, Take 2.5 mg by mouth 2 (two) times daily., Disp: , Rfl:  .  azithromycin (ZITHROMAX Z-PAK) 250 MG tablet, 2 po day one, then 1 daily x 4 days (Patient not taking: Reported on 03/13/2018), Disp: 5 tablet, Rfl: 0 .  carvedilol (COREG) 3.125 MG tablet, Take 3.125 mg by mouth 2 (two) times daily with a meal., Disp: , Rfl:  .  diphenhydrAMINE (BENADRYL) 25 MG tablet, Take 25 mg by mouth at bedtime., Disp: , Rfl:  .  furosemide (LASIX) 20 MG tablet, Take 20 mg by mouth daily. , Disp: , Rfl:  .  Glycopyrrolate-Formoterol (BEVESPI AEROSPHERE) 9-4.8 MCG/ACT AERO, Inhale 2 puffs into the lungs 2 (two) times daily., Disp: , Rfl:  .  Liniments (SALONPAS PAIN RELIEF PATCH EX), Place 1 patch onto the skin daily as needed (pain)., Disp: , Rfl:  .  OLODATEROL HCL IN, Inhale 2.5 mg into the lungs every morning. 2 puffs, Disp: , Rfl:  .  oxyCODONE-acetaminophen (PERCOCET) 5-325 MG tablet, Take 1-2 tablets by mouth every 4 (four) hours as needed. (Patient not taking: Reported on 03/13/2018), Disp: 20 tablet, Rfl: 0 .  rOPINIRole (REQUIP) 0.25 MG tablet, Take 0.25 mg by mouth at bedtime. , Disp: , Rfl:  .  sodium chloride 1 g  tablet, Take 1 g by mouth 3 (three) times daily., Disp: , Rfl:   Past Medical History: Past Medical History:  Diagnosis Date  . Aortic anomaly    aortic calcification  . Arrhythmia    a fib  . Cancer (HCC)    lung  . Chronic obstructive lung disease (HCC)   . Hypertension   . Ischemic cardiomyopathy   . Peripheral vascular disease (HCC)   . Stroke (HCC)    oct 2012    Tobacco Use: Social History   Tobacco Use  Smoking Status Light Tobacco Smoker  . Packs/day: 0.10  . Types: Cigarettes  Smokeless Tobacco Never Used  Tobacco Comment   pt smokes 1 cigarrete occasionally. 1 pack "last a few months"    Labs: Recent Review Flowsheet Data    There is no flowsheet data to display.      Capillary Blood Glucose: No results found for: GLUCAP   Pulmonary Assessment Scores:   Pulmonary Function Assessment:   Exercise Target Goals: Exercise Program Goal: Individual exercise prescription set using results from initial 6 min walk test and THRR while considering  patient's activity barriers and safety.   Exercise Prescription Goal: Initial exercise prescription builds to 30-45 minutes a day of aerobic activity, 2-3 days per week.  Home exercise guidelines will be given to patient during program as part of exercise prescription that the   participant will acknowledge.  Activity Barriers & Risk Stratification: Activity Barriers & Cardiac Risk Stratification - 03/13/18 1203      Activity Barriers & Cardiac Risk Stratification   Activity Barriers  Back Problems;Balance Concerns;Shortness of Breath       6 Minute Walk: 6 Minute Walk    Row Name 03/17/18 0652         6 Minute Walk   Distance  1085 feet     Walk Time  6 minutes     # of Rest Breaks  0     MPH  2.05     METS  2.61     RPE  12     Perceived Dyspnea   3     Symptoms  No     Resting HR  64 bpm     Resting BP  114/64     Resting Oxygen Saturation   100 %     Exercise Oxygen Saturation  during 6 min  walk  94 %     Max Ex. HR  106 bpm     Max Ex. BP  122/62     2 Minute Post BP  116/64       Interval HR   1 Minute HR  97     2 Minute HR  106     3 Minute HR  104     4 Minute HR  105     5 Minute HR  105     6 Minute HR  104     2 Minute Post HR  88     Interval Heart Rate?  Yes       Interval Oxygen   Interval Oxygen?  Yes     Baseline Oxygen Saturation %  100 %     1 Minute Oxygen Saturation %  94 %     1 Minute Liters of Oxygen  0 L     2 Minute Oxygen Saturation %  94 %     2 Minute Liters of Oxygen  0 L     3 Minute Oxygen Saturation %  94 %     3 Minute Liters of Oxygen  0 L     4 Minute Oxygen Saturation %  96 %     4 Minute Liters of Oxygen  0 L     5 Minute Oxygen Saturation %  98 %     5 Minute Liters of Oxygen  0 L     6 Minute Oxygen Saturation %  98 %     6 Minute Liters of Oxygen  0 L     2 Minute Post Oxygen Saturation %  100 %     2 Minute Post Liters of Oxygen  0 L        Oxygen Initial Assessment: Oxygen Initial Assessment - 03/17/18 0651      Home Oxygen   Home Oxygen Device  Portable Concentrator;Home Concentrator    Sleep Oxygen Prescription  Continuous    Liters per minute  2    Home Exercise Oxygen Prescription  None    Home at Rest Exercise Oxygen Prescription  None    Compliance with Home Oxygen Use  Yes      Initial 6 min Walk   Oxygen Used  None      Program Oxygen Prescription   Program Oxygen Prescription  None      Intervention   Short Term Goals  To learn  and exhibit compliance with exercise, home and travel O2 prescription;To learn and understand importance of maintaining oxygen saturations>88%;To learn and demonstrate proper use of respiratory medications;To learn and understand importance of monitoring SPO2 with pulse oximeter and demonstrate accurate use of the pulse oximeter.;To learn and demonstrate proper pursed lip breathing techniques or other breathing techniques.    Long  Term Goals  Exhibits compliance with exercise,  home and travel O2 prescription;Verbalizes importance of monitoring SPO2 with pulse oximeter and return demonstration;Maintenance of O2 saturations>88%;Exhibits proper breathing techniques, such as pursed lip breathing or other method taught during program session;Compliance with respiratory medication;Demonstrates proper use of MDI's       Oxygen Re-Evaluation: Oxygen Re-Evaluation    Row Name 03/24/18 0725             Program Oxygen Prescription   Program Oxygen Prescription  None         Home Oxygen   Home Oxygen Device  Portable Concentrator;Home Concentrator       Liters per minute  2       Home Exercise Oxygen Prescription  None       Home at Rest Exercise Oxygen Prescription  None       Compliance with Home Oxygen Use  Yes         Goals/Expected Outcomes   Short Term Goals  To learn and exhibit compliance with exercise, home and travel O2 prescription;To learn and understand importance of maintaining oxygen saturations>88%;To learn and demonstrate proper use of respiratory medications;To learn and understand importance of monitoring SPO2 with pulse oximeter and demonstrate accurate use of the pulse oximeter.;To learn and demonstrate proper pursed lip breathing techniques or other breathing techniques.       Long  Term Goals  Exhibits compliance with exercise, home and travel O2 prescription;Verbalizes importance of monitoring SPO2 with pulse oximeter and return demonstration;Maintenance of O2 saturations>88%;Exhibits proper breathing techniques, such as pursed lip breathing or other method taught during program session;Compliance with respiratory medication;Demonstrates proper use of MDI's       Goals/Expected Outcomes  compliance           Oxygen Discharge (Final Oxygen Re-Evaluation): Oxygen Re-Evaluation - 03/24/18 0725      Program Oxygen Prescription   Program Oxygen Prescription  None      Home Oxygen   Home Oxygen Device  Portable Concentrator;Home Concentrator     Liters per minute  2    Home Exercise Oxygen Prescription  None    Home at Rest Exercise Oxygen Prescription  None    Compliance with Home Oxygen Use  Yes      Goals/Expected Outcomes   Short Term Goals  To learn and exhibit compliance with exercise, home and travel O2 prescription;To learn and understand importance of maintaining oxygen saturations>88%;To learn and demonstrate proper use of respiratory medications;To learn and understand importance of monitoring SPO2 with pulse oximeter and demonstrate accurate use of the pulse oximeter.;To learn and demonstrate proper pursed lip breathing techniques or other breathing techniques.    Long  Term Goals  Exhibits compliance with exercise, home and travel O2 prescription;Verbalizes importance of monitoring SPO2 with pulse oximeter and return demonstration;Maintenance of O2 saturations>88%;Exhibits proper breathing techniques, such as pursed lip breathing or other method taught during program session;Compliance with respiratory medication;Demonstrates proper use of MDI's    Goals/Expected Outcomes  compliance        Initial Exercise Prescription: Initial Exercise Prescription - 03/17/18 0600      Date of Initial  Exercise RX and Referring Provider   Date  03/16/18    Referring Provider  Dr. Nelda Marseille      Bike   Level  0.4    Minutes  17      NuStep   Level  2    SPM  80    Minutes  17      Track   Laps  10    Minutes  17      Prescription Details   Frequency (times per week)  2    Duration  Progress to 45 minutes of aerobic exercise without signs/symptoms of physical distress      Intensity   THRR 40-80% of Max Heartrate  58-117    Ratings of Perceived Exertion  11-13    Perceived Dyspnea  0-4      Progression   Progression  Continue to progress workloads to maintain intensity without signs/symptoms of physical distress.      Resistance Training   Training Prescription  Yes    Weight  orange bands    Reps  10-15        Perform Capillary Blood Glucose checks as needed.  Exercise Prescription Changes:  Exercise Prescription Changes    Row Name 03/24/18 1400             Response to Exercise   Blood Pressure (Admit)  106/62       Blood Pressure (Exercise)  118/60       Blood Pressure (Exit)  92/62       Heart Rate (Admit)  76 bpm       Heart Rate (Exercise)  110 bpm       Heart Rate (Exit)  85 bpm       Oxygen Saturation (Admit)  100 %       Oxygen Saturation (Exercise)  95 %       Oxygen Saturation (Exit)  95 %       Rating of Perceived Exertion (Exercise)  15       Perceived Dyspnea (Exercise)  2       Duration  Progress to 45 minutes of aerobic exercise without signs/symptoms of physical distress       Intensity  Other (comment) 40-80% of HRR         Progression   Progression  Continue to progress workloads to maintain intensity without signs/symptoms of physical distress.         Resistance Training   Training Prescription  Yes       Weight  orange bands       Reps  10-15       Time  10 Minutes         Bike   Level  0.3       Minutes  17         NuStep   Level  2       SPM  80       Minutes  17       METs  2.1         Track   Laps  10       Minutes  17          Exercise Comments:   Exercise Goals and Review:  Exercise Goals    Row Name 03/13/18 1208             Exercise Goals   Increase Physical Activity  Yes       Intervention  Provide advice, education, support and counseling about physical activity/exercise needs.;Develop an individualized exercise prescription for aerobic and resistive training based on initial evaluation findings, risk stratification, comorbidities and participant's personal goals.       Expected Outcomes  Short Term: Attend rehab on a regular basis to increase amount of physical activity.;Long Term: Add in home exercise to make exercise part of routine and to increase amount of physical activity.;Long Term: Exercising regularly at least  3-5 days a week.       Increase Strength and Stamina  Yes       Intervention  Provide advice, education, support and counseling about physical activity/exercise needs.;Develop an individualized exercise prescription for aerobic and resistive training based on initial evaluation findings, risk stratification, comorbidities and participant's personal goals.       Expected Outcomes  Short Term: Increase workloads from initial exercise prescription for resistance, speed, and METs.;Short Term: Perform resistance training exercises routinely during rehab and add in resistance training at home;Long Term: Improve cardiorespiratory fitness, muscular endurance and strength as measured by increased METs and functional capacity (6MWT)       Able to understand and use rate of perceived exertion (RPE) scale  Yes       Intervention  Provide education and explanation on how to use RPE scale       Expected Outcomes  Short Term: Able to use RPE daily in rehab to express subjective intensity level;Long Term:  Able to use RPE to guide intensity level when exercising independently       Able to understand and use Dyspnea scale  Yes       Intervention  Provide education and explanation on how to use Dyspnea scale       Expected Outcomes  Short Term: Able to use Dyspnea scale daily in rehab to express subjective sense of shortness of breath during exertion;Long Term: Able to use Dyspnea scale to guide intensity level when exercising independently       Knowledge and understanding of Target Heart Rate Range (THRR)  Yes       Intervention  Provide education and explanation of THRR including how the numbers were predicted and where they are located for reference       Expected Outcomes  Short Term: Able to state/look up THRR;Long Term: Able to use THRR to govern intensity when exercising independently;Short Term: Able to use daily as guideline for intensity in rehab       Understanding of Exercise Prescription  Yes        Intervention  Provide education, explanation, and written materials on patient's individual exercise prescription       Expected Outcomes  Short Term: Able to explain program exercise prescription;Long Term: Able to explain home exercise prescription to exercise independently       Improve claudication pain toleration; Improve walking ability  Yes       Intervention  Participate in PAD/SET Rehab 2-3 days a week, walking at home as part of exercise prescription;Attend education sessions to aid in risk factor modification and understanding of disease process       Expected Outcomes  Short Term: Improve walking distance/time to onset of claudication pain;Long Term: Improve walking ability and toleration to claudication;Long Term: Improve score of PAD questionnaires          Exercise Goals Re-Evaluation : Exercise Goals Re-Evaluation    Row Name 03/24/18 0726             Exercise Goal Re-Evaluation  Exercise Goals Review  Increase Physical Activity;Increase Strength and Stamina;Able to understand and use rate of perceived exertion (RPE) scale;Able to understand and use Dyspnea scale;Knowledge and understanding of Target Heart Rate Range (THRR);Understanding of Exercise Prescription       Comments  Pt will begin his first exercise session today. Will monitor and progress as able through the program.       Expected Outcomes  Through exercise at rehab and at home, the patient will decrease shortness of breath with daily activities and feel confident in carrying out an exercise regime at home.           Discharge Exercise Prescription (Final Exercise Prescription Changes): Exercise Prescription Changes - 03/24/18 1400      Response to Exercise   Blood Pressure (Admit)  106/62    Blood Pressure (Exercise)  118/60    Blood Pressure (Exit)  92/62    Heart Rate (Admit)  76 bpm    Heart Rate (Exercise)  110 bpm    Heart Rate (Exit)  85 bpm    Oxygen Saturation (Admit)  100 %    Oxygen  Saturation (Exercise)  95 %    Oxygen Saturation (Exit)  95 %    Rating of Perceived Exertion (Exercise)  15    Perceived Dyspnea (Exercise)  2    Duration  Progress to 45 minutes of aerobic exercise without signs/symptoms of physical distress    Intensity  Other (comment)   40-80% of HRR     Progression   Progression  Continue to progress workloads to maintain intensity without signs/symptoms of physical distress.      Resistance Training   Training Prescription  Yes    Weight  orange bands    Reps  10-15    Time  10 Minutes      Bike   Level  0.3    Minutes  17      NuStep   Level  2    SPM  80    Minutes  17    METs  2.1      Track   Laps  10    Minutes  17       Nutrition:  Target Goals: Understanding of nutrition guidelines, daily intake of sodium '1500mg'$ , cholesterol '200mg'$ , calories 30% from fat and 7% or less from saturated fats, daily to have 5 or more servings of fruits and vegetables.  Biometrics: Pre Biometrics - 03/13/18 1204      Pre Biometrics   Grip Strength  30 kg        Nutrition Therapy Plan and Nutrition Goals:   Nutrition Assessments:   Nutrition Goals Re-Evaluation:   Nutrition Goals Discharge (Final Nutrition Goals Re-Evaluation):   Psychosocial: Target Goals: Acknowledge presence or absence of significant depression and/or stress, maximize coping skills, provide positive support system. Participant is able to verbalize types and ability to use techniques and skills needed for reducing stress and depression.  Initial Review & Psychosocial Screening: Initial Psych Review & Screening - 03/13/18 1212      Initial Review   Current issues with  None Identified      Family Dynamics   Good Support System?  Yes      Barriers   Psychosocial barriers to participate in program  There are no identifiable barriers or psychosocial needs.;The patient should benefit from training in stress management and relaxation.      Screening  Interventions   Interventions  Encouraged to exercise;To provide support  and resources with identified psychosocial needs    Expected Outcomes  Long Term Goal: Stressors or current issues are controlled or eliminated.;Short Term goal: Identification and review with participant of any Quality of Life or Depression concerns found by scoring the questionnaire.;Long Term goal: The participant improves quality of Life and PHQ9 Scores as seen by post scores and/or verbalization of changes       Quality of Life Scores:  Scores of 19 and below usually indicate a poorer quality of life in these areas.  A difference of  2-3 points is a clinically meaningful difference.  A difference of 2-3 points in the total score of the Quality of Life Index has been associated with significant improvement in overall quality of life, self-image, physical symptoms, and general health in studies assessing change in quality of life.  PHQ-9: Recent Review Flowsheet Data    Depression screen [facility] 2/9 03/13/2018 03/13/2018   Decreased Interest 0 0   Down, Depressed, Hopeless 0 0   PHQ - 2 Score 0 0   Altered sleeping 0 0   Tired, decreased energy 3 3   Change in appetite 3 3   Feeling bad or failure about yourself  0 0   Trouble concentrating 0 0   Moving slowly or fidgety/restless 0 0   Suicidal thoughts 0 0   PHQ-9 Score 6 6   Difficult doing work/chores Somewhat difficult Somewhat difficult     Interpretation of Total Score  Total Score Depression Severity:  1-4 = Minimal depression, 5-9 = Mild depression, 10-14 = Moderate depression, 15-19 = Moderately severe depression, 20-27 = Severe depression   Psychosocial Evaluation and Intervention: Psychosocial Evaluation - 03/13/18 1215      Psychosocial Evaluation & Interventions   Interventions  Stress management education;Relaxation education;Encouraged to exercise with the program and follow exercise prescription    Comments  Pt with recewnt move from [place] to [place].  good support from wife and grandaughter.    Expected Outcomes  Pt will continue to display positve and healthy coping skills by his family    Continue Psychosocial Services   Follow up required by staff       Psychosocial Re-Evaluation: Psychosocial Re-Evaluation    [place] Name 03/25/18 1645             Psychosocial Re-Evaluation   Current issues with  Current Stress Concerns       Comments  Pt has recently moved from [place] to [place]. Lives with wife who is a heavy smoker. Pt has moved to be closer to grandaughter, who he states does not come around very often. Pt states that he has a "shop" that he goes out to when his wife smoking bothers him or when he needs to get away.       Expected Outcomes  pt will become more comfortable with [place] and will maintain coping skills to deal with current stressors.       Interventions  Relaxation education;Stress management education;Encouraged to attend Pulmonary Rehabilitation for the exercise       Continue Psychosocial Services   Follow up required by staff       Comments  pt has moved to [facility] for better air quality to deal with illness         Initial Review   Source of Stress Concerns  Chronic Illness          Psychosocial Discharge (Final Psychosocial Re-Evaluation): Psychosocial Re-Evaluation - 03/25/18 1645      Psychosocial  Re-Evaluation   Current issues with  Current Stress Concerns    Comments  Pt has recently moved from [place] to [place]. Lives with wife who is a heavy smoker. Pt has moved to be closer to grandaughter, who he states does not come around very often. Pt states that he has a "shop" that he goes out to when his wife smoking bothers him or when he needs to get away.    Expected Outcomes  pt will become more comfortable with [place] and will maintain coping skills to deal with current stressors.    Interventions  Relaxation education;Stress management education;Encouraged to attend Pulmonary Rehabilitation for the exercise    Continue Psychosocial  Services   Follow up required by staff    Comments  pt has moved to [facility] for better air quality to deal with illness      Initial Review   Source of Stress Concerns  Chronic Illness       Education: Education Goals: Education classes will be provided on a weekly basis, covering required topics. Participant will state understanding/return demonstration of topics presented.  Learning Barriers/Preferences: Learning Barriers/Preferences - 03/13/18 1218      Learning Barriers/Preferences   Learning Barriers  Sight;Hearing    Learning Preferences  Written Material;Computer/Internet;Group Instruction       Education Topics: Risk Factor Reduction:  -Group instruction that is supported by a PowerPoint presentation. Instructor discusses the definition of a risk factor, different risk factors for pulmonary disease, and how the heart and lungs work together.     Nutrition for Pulmonary Patient:  -Group instruction provided by PowerPoint slides, verbal discussion, and written materials to support subject matter. The instructor gives an explanation and review of healthy diet recommendations, which includes a discussion on weight management, recommendations for fruit and vegetable consumption, as well as protein, fluid, caffeine, fiber, sodium, sugar, and alcohol. Tips for eating when patients are short of breath are discussed.   Pursed Lip Breathing:  -Group instruction that is supported by demonstration and informational handouts. Instructor discusses the benefits of pursed lip and diaphragmatic breathing and detailed demonstration on how to preform both.     Oxygen Safety:  -Group instruction provided by PowerPoint, verbal discussion, and written material to support subject matter. There is an overview of "What is Oxygen" and "Why do we need it".  Instructor also reviews how to create a safe environment for oxygen use, the importance of using oxygen as prescribed, and the risks of noncompliance.  There is a brief discussion on traveling with oxygen and resources the patient may utilize.   Oxygen Equipment:  -Group instruction provided by [facility] Staff utilizing handouts, written materials, and equipment demonstrations.   Signs and Symptoms:  -Group instruction provided by written material and verbal discussion to support subject matter. Warning signs and symptoms of infection, stroke, and heart attack are reviewed and when to call the physician/911 reinforced. Tips for preventing the spread of infection discussed.   Advanced Directives:  -Group instruction provided by verbal instruction and written material to support subject matter. Instructor reviews Advanced Directive laws and proper instruction for filling out document.   Pulmonary Video:  -Group video education that reviews the importance of medication and oxygen compliance, exercise, good nutrition, pulmonary hygiene, and pursed lip and diaphragmatic breathing for the pulmonary patient.   Exercise for the Pulmonary Patient:  -Group instruction that is supported by a PowerPoint presentation. Instructor discusses benefits of exercise, core components of exercise, frequency, duration, and intensity of  an exercise routine, importance of utilizing pulse oximetry during exercise, safety while exercising, and options of places to exercise outside of rehab.     Pulmonary Medications:  -Verbally interactive group education provided by instructor with focus on inhaled medications and proper administration.   Anatomy and Physiology of the Respiratory System and Intimacy:  -Group instruction provided by PowerPoint, verbal discussion, and written material to support subject matter. Instructor reviews respiratory cycle and anatomical components of the respiratory system and their functions. Instructor also reviews differences in obstructive and restrictive respiratory diseases with examples of each. Intimacy, Sex, and Sexuality  differences are reviewed with a discussion on how relationships can change when diagnosed with pulmonary disease. Common sexual concerns are reviewed.   MD DAY -A group question and answer session with a medical doctor that allows participants to ask questions that relate to their pulmonary disease state.   OTHER EDUCATION -Group or individual verbal, written, or video instructions that support the educational goals of the pulmonary rehab program.   Holiday Eating Survival Tips:  -Group instruction provided by PowerPoint slides, verbal discussion, and written materials to support subject matter. The instructor gives patients tips, tricks, and techniques to help them not only survive but enjoy the holidays despite the onslaught of food that accompanies the holidays.   Knowledge Questionnaire Score:   Core Components/Risk Factors/Patient Goals at Admission: Personal Goals and Risk Factors at Admission - 03/13/18 1219      Core Components/Risk Factors/Patient Goals on Admission    Weight Management  Weight Gain    Tobacco Cessation  Yes    Number of packs per day  vaping and occasional cigarette use    Intervention  Assist the participant in steps to quit. Provide individualized education and counseling about committing to Tobacco Cessation, relapse prevention, and pharmacological support that can be provided by physician.;Advice worker, assist with locating and accessing local/national Quit Smoking programs, and support quit date choice.    Expected Outcomes  Short Term: Will demonstrate readiness to quit, by selecting a quit date.;Short Term: Will quit all tobacco product use, adhering to prevention of relapse plan.    Improve shortness of breath with ADL's  Yes    Intervention  Provide education, individualized exercise plan and daily activity instruction to help decrease symptoms of SOB with activities of daily living.    Expected Outcomes  Short Term: Improve  cardiorespiratory fitness to achieve a reduction of symptoms when performing ADLs;Long Term: Be able to perform more ADLs without symptoms or delay the onset of symptoms    Stress  Yes    Intervention  Offer individual and/or small group education and counseling on adjustment to heart disease, stress management and health-related lifestyle change. Teach and support self-help strategies.;Refer participants experiencing significant psychosocial distress to appropriate mental health specialists for further evaluation and treatment. When possible, include family members and significant others in education/counseling sessions.    Expected Outcomes  Short Term: Participant demonstrates changes in health-related behavior, relaxation and other stress management skills, ability to obtain effective social support, and compliance with psychotropic medications if prescribed.;Long Term: Emotional wellbeing is indicated by absence of clinically significant psychosocial distress or social isolation.       Core Components/Risk Factors/Patient Goals Review:  Goals and Risk Factor Review    Row Name 03/13/18 1222 03/25/18 1653           Core Components/Risk Factors/Patient Goals Review   Personal Goals Review  Weight Management/Obesity;Tobacco Cessation;Improve shortness of breath with  ADL's;Develop more efficient breathing techniques such as purse lipped breathing and diaphragmatic breathing and practicing self-pacing with activity.;Increase knowledge of respiratory medications and ability to use respiratory devices properly.;Stress  Weight Management/Obesity;Tobacco Cessation;Improve shortness of breath with ADL's;Develop more efficient breathing techniques such as purse lipped breathing and diaphragmatic breathing and practicing self-pacing with activity.;Increase knowledge of respiratory medications and ability to use respiratory devices properly.;Stress      Review  -  Pt has completes 1 day of exercise and no  education. Pt completed 10 laps around the track, a level 2 on the nustep, and a level of 0.3 on the airdyne. Pt reports no changes in smoking habits, but will hopefully decrease use. pt will learn PLB techniques and implement then with the assistance of staff. Anticipate will moke progress for the next 30 day review.       Expected Outcomes  -  see admission goals and outcomes         Core Components/Risk Factors/Patient Goals at Discharge (Final Review):  Goals and Risk Factor Review - 03/25/18 1653      Core Components/Risk Factors/Patient Goals Review   Personal Goals Review  Weight Management/Obesity;Tobacco Cessation;Improve shortness of breath with ADL's;Develop more efficient breathing techniques such as purse lipped breathing and diaphragmatic breathing and practicing self-pacing with activity.;Increase knowledge of respiratory medications and ability to use respiratory devices properly.;Stress    Review  Pt has completes 1 day of exercise and no education. Pt completed 10 laps around the track, a level 2 on the nustep, and a level of 0.3 on the airdyne. Pt reports no changes in smoking habits, but will hopefully decrease use. pt will learn PLB techniques and implement then with the assistance of staff. Anticipate will moke progress for the next 30 day review.     Expected Outcomes  see admission goals and outcomes       ITP Comments: ITP Comments    Row Name 03/13/18 1126 03/25/18 1643         ITP Comments  Dr. Manfred Arch medical director for pulmonary rehab.  Dr. Manfred Arch medical director for pulmonary rehab.         Comments: ITP REVIEW Pt is making expected progress toward personal goals after completing 1 sessions.   Recommend continued exercise, life style modification, education, and utilization of breathing texhniques to increase stamina and strength and decrease shortness of breath with exertion.  Joycelyn Man RN, BSN Cardiac and Pulmonary Rehab RN

## 2018-03-26 ENCOUNTER — Encounter (HOSPITAL_COMMUNITY)
Admission: RE | Admit: 2018-03-26 | Discharge: 2018-03-26 | Disposition: A | Discharge status: Home / Self Care | Payer: No Typology Code available for payment source | Source: Ambulatory Visit | Attending: Pulmonary Disease | Admitting: Pulmonary Disease

## 2018-03-26 DIAGNOSIS — J449 Chronic obstructive pulmonary disease, unspecified: Secondary | ICD-10-CM

## 2018-03-26 NOTE — Progress Notes (Signed)
Daily Session Note  Patient Details  Name: Brett Saunders MRN: [id number] Date of Birth: 07-22-1945 Referring Provider:     Pulmonary Rehab Walk Test from 03/16/2018 in [place]  Referring Provider  Dr. Nelda Marseille      Encounter Date: 03/26/2018  Check In: Session Check In - 03/26/18 1048      Check-In   Supervising physician immediately available to respond to emergencies  [facility] immediately available    Physician(s)  Dr. Bonner Puna    Location  [facility]    Staff Present  Joycelyn Man RN, Maxcine Ham, RN, BSN;Carlette Wilber Oliphant, RN, BSN;Lisa Ysidro Evert, RN;Dalton Fletcher, MS, Exercise Physiologist    Medication changes reported      No    Fall or balance concerns reported     No    Tobacco Cessation  No Change    Warm-up and Cool-down  Performed as group-led instruction    Resistance Training Performed  Yes    VAD Patient?  No    PAD/SET Patient?  No      Pain Assessment   Currently in Pain?  No/denies    Pain Score  0-No pain    Multiple Pain Sites  No       Capillary Blood Glucose: No results found for this or any previous visit (from the past 24 hour(s)).    Social History   Tobacco Use  Smoking Status Light Tobacco Smoker  . Packs/day: 0.10  . Types: Cigarettes  Smokeless Tobacco Never Used  Tobacco Comment   pt smokes 1 cigarrete occasionally. 1 pack "last a few months"    Goals Met:  Proper associated with RPD/PD & O2 Sat Exercise tolerated well Strength training completed today  Goals Unmet:  Not Applicable  Comments: Service time is from 1030 to 1210   Dr. Rush Farmer is Medical Director for Pulmonary Rehab at [facility].

## 2018-03-31 ENCOUNTER — Telehealth (HOSPITAL_COMMUNITY): Payer: Self-pay

## 2018-03-31 ENCOUNTER — Encounter (HOSPITAL_COMMUNITY): Payer: No Typology Code available for payment source

## 2018-04-02 ENCOUNTER — Encounter (HOSPITAL_COMMUNITY): Payer: No Typology Code available for payment source

## 2018-04-07 ENCOUNTER — Encounter (HOSPITAL_COMMUNITY)
Admission: RE | Admit: 2018-04-07 | Discharge: 2018-04-07 | Disposition: A | Discharge status: Home / Self Care | Payer: No Typology Code available for payment source | Source: Ambulatory Visit | Attending: Pulmonary Disease | Admitting: Pulmonary Disease

## 2018-04-07 VITALS — Wt 117.3 lb

## 2018-04-07 DIAGNOSIS — J449 Chronic obstructive pulmonary disease, unspecified: Secondary | ICD-10-CM | POA: Diagnosis not present

## 2018-04-07 NOTE — Progress Notes (Signed)
Daily Session Note  Patient Details  Name: Brett Saunders MRN: [id number] Date of Birth: [birth year] Referring Provider:     Pulmonary Rehab Walk Test from 03/16/2018 in [place]  Referring Provider  Dr. Nelda Marseille      Encounter Date: 04/07/2018  Check In: Session Check In - 04/07/18 1056      Check-In   Supervising physician immediately available to respond to emergencies  [facility] immediately available    Physician(s)  Dr. Waldron Labs    Location  [facility]    Staff Present  Joycelyn Man RN, Maxcine Ham, RN, BSN;Carlette Wilber Oliphant, RN, Bjorn Loser, MS, Exercise Physiologist;Annedrea Rosezella [place], RN, MHA    Medication changes reported      No    Fall or balance concerns reported     No    Tobacco Cessation  No Change    Warm-up and Cool-down  Performed as group-led instruction    Resistance Training Performed  Yes    VAD Patient?  No    PAD/SET Patient?  No      Pain Assessment   Currently in Pain?  No/denies    Pain Score  0-No pain    Multiple Pain Sites  No       Capillary Blood Glucose: No results found for this or any previous visit (from the past 24 hour(s)).  Exercise Prescription Changes - 04/07/18 1200      Response to Exercise   Blood Pressure (Admit)  110/72    Blood Pressure (Exercise)  100/64    Blood Pressure (Exit)  102/64    Heart Rate (Admit)  70 bpm    Heart Rate (Exercise)  106 bpm    Heart Rate (Exit)  90 bpm    Oxygen Saturation (Admit)  100 %    Oxygen Saturation (Exercise)  95 %    Oxygen Saturation (Exit)  90 %    Rating of Perceived Exertion (Exercise)  9    Perceived Dyspnea (Exercise)  1    Duration  Progress to 45 minutes of aerobic exercise without signs/symptoms of physical distress    Intensity  THRR unchanged      Progression   Progression  Continue to progress workloads to maintain intensity without signs/symptoms of physical distress.      Resistance Training    Training Prescription  Yes    Weight  orange bands    Reps  10-15    Time  10 Minutes      Interval Training   Interval Training  No      NuStep   Level  2    SPM  80    Minutes  17    METs  1.6      Track   Laps  10    Minutes  17       Social History   Tobacco Use  Smoking Status Light Tobacco Smoker  . Packs/day: 0.10  . Types: Cigarettes  Smokeless Tobacco Never Used  Tobacco Comment   pt smokes 1 cigarrete occasionally. 1 pack "last a few months"    Goals Met:  Proper associated with RPD/PD & O2 Sat Exercise tolerated well Strength training completed today  Goals Unmet:  Not Applicable  Comments: Service time is from 1030 to 1210    Dr. Rush Farmer is Medical Director for Pulmonary Rehab at [facility].

## 2018-04-07 NOTE — Progress Notes (Signed)
I have reviewed a Home Exercise Prescription with Brett Saunders . Brett Saunders is not currently exercising at home.  The patient was advised to walk, use the stepper, and use the bike 2 days a week for 45 minutes.  [name] and I discussed how to progress their exercise prescription.  The patient stated that their goals were to get stronger and gain weight.  The patient stated that they understand the exercise prescription.  We reviewed exercise guidelines, target heart rate during exercise, RPE Scale, weather conditions, NTG use, endpoints for exercise, warmup and cool down.  Patient is encouraged to come to me with any questions. I will continue to follow up with the patient to assist them with progression and safety.

## 2018-04-09 ENCOUNTER — Telehealth (HOSPITAL_COMMUNITY): Payer: Self-pay

## 2018-04-09 ENCOUNTER — Encounter (HOSPITAL_COMMUNITY): Payer: No Typology Code available for payment source

## 2018-04-14 ENCOUNTER — Encounter (HOSPITAL_COMMUNITY)
Admission: RE | Admit: 2018-04-14 | Discharge: 2018-04-14 | Disposition: A | Discharge status: Home / Self Care | Payer: No Typology Code available for payment source | Source: Ambulatory Visit | Attending: Pulmonary Disease | Admitting: Pulmonary Disease

## 2018-04-14 DIAGNOSIS — J449 Chronic obstructive pulmonary disease, unspecified: Secondary | ICD-10-CM

## 2018-04-14 NOTE — Progress Notes (Signed)
Daily Session Note  Patient Details  Name: Brett Saunders MRN: [id number] Date of Birth: Jul 19, 1945 Referring Provider:     Pulmonary Rehab Walk Test from 03/16/2018 in [place]  Referring Provider  Dr. Nelda Marseille      Encounter Date: 04/14/2018  Check In: Session Check In - 04/14/18 1235      Check-In   Supervising physician immediately available to respond to emergencies  [facility] immediately available    Physician(s)  Dr. Teryl Lucy    Location  [facility]    Staff Present  Joycelyn Man RN, BSN;Lino Wickliff Leonia Reeves, RN, BSN;Molly DiVincenzo, MS, ACSM RCEP, Exercise Physiologist;Dalton Kris Mouton, MS, Exercise Physiologist;Lisa Ysidro Evert, RN    Medication changes reported      No    Fall or balance concerns reported     No    Tobacco Cessation  No Change    Warm-up and Cool-down  Performed as group-led instruction    Resistance Training Performed  Yes    VAD Patient?  No    PAD/SET Patient?  No      Pain Assessment   Currently in Pain?  No/denies    Pain Score  0-No pain    Multiple Pain Sites  No       Capillary Blood Glucose: No results found for this or any previous visit (from the past 24 hour(s)).    Social History   Tobacco Use  Smoking Status Light Tobacco Smoker  . Packs/day: 0.10  . Types: Cigarettes  Smokeless Tobacco Never Used  Tobacco Comment   pt smokes 1 cigarrete occasionally. 1 pack "last a few months"    Goals Met:  Proper associated with RPD/PD & O2 Sat Exercise tolerated well Strength training completed today  Goals Unmet:  Not Applicable  Comments: Service time is from 1030 to 1210   Dr. Rush Farmer is Medical Director for Pulmonary Rehab at [facility].

## 2018-04-16 ENCOUNTER — Encounter (HOSPITAL_COMMUNITY)
Admission: RE | Admit: 2018-04-16 | Discharge: 2018-04-16 | Disposition: A | Discharge status: Home / Self Care | Payer: No Typology Code available for payment source | Source: Ambulatory Visit | Attending: Pulmonary Disease | Admitting: Pulmonary Disease

## 2018-04-16 DIAGNOSIS — J449 Chronic obstructive pulmonary disease, unspecified: Secondary | ICD-10-CM | POA: Diagnosis not present

## 2018-04-16 NOTE — Progress Notes (Signed)
Daily Session Note  Patient Details  Name: Brett Saunders MRN: [id number] Date of Birth: Apr 08, 1945 Referring Provider:     Pulmonary Rehab Walk Test from 03/16/2018 in [place]  Referring Provider  Dr. Nelda Marseille      Encounter Date: 04/16/2018  Check In: Session Check In - 04/16/18 1055      Check-In   Supervising physician immediately available to respond to emergencies  [facility] immediately available    Physician(s)  Dr. Waldron Labs    Location  [facility]    Staff Present  Joycelyn Man RN, BSN;Molly DiVincenzo, MS, ACSM RCEP, Exercise Physiologist;Dalton Kris Mouton, MS, Exercise Physiologist;Melonie Germani Ysidro Evert, RN    Medication changes reported      No    Fall or balance concerns reported     No    Tobacco Cessation  No Change    Warm-up and Cool-down  Performed as group-led instruction    Resistance Training Performed  No    VAD Patient?  No    PAD/SET Patient?  No      Pain Assessment   Currently in Pain?  No/denies    Multiple Pain Sites  No       Capillary Blood Glucose: No results found for this or any previous visit (from the past 24 hour(s)).    Social History   Tobacco Use  Smoking Status Light Tobacco Smoker  . Packs/day: 0.10  . Types: Cigarettes  Smokeless Tobacco Never Used  Tobacco Comment   pt smokes 1 cigarrete occasionally. 1 pack "last a few months"    Goals Met:  Exercise tolerated well No report of cardiac concerns or symptoms Strength training completed today  Goals Unmet:  Not Applicable  Comments: Service time is from 1030 to 1225    Dr. Rush Farmer is Medical Director for Pulmonary Rehab at [facility].

## 2018-04-21 ENCOUNTER — Encounter (HOSPITAL_COMMUNITY)
Admission: RE | Admit: 2018-04-21 | Discharge: 2018-04-21 | Disposition: A | Discharge status: Home / Self Care | Payer: No Typology Code available for payment source | Source: Ambulatory Visit | Attending: Pulmonary Disease | Admitting: Pulmonary Disease

## 2018-04-21 ENCOUNTER — Encounter (HOSPITAL_COMMUNITY): Payer: Self-pay

## 2018-04-21 VITALS — Wt 118.8 lb

## 2018-04-21 DIAGNOSIS — Z7901 Long term (current) use of anticoagulants: Secondary | ICD-10-CM | POA: Diagnosis not present

## 2018-04-21 DIAGNOSIS — I10 Essential (primary) hypertension: Secondary | ICD-10-CM | POA: Diagnosis not present

## 2018-04-21 DIAGNOSIS — F17210 Nicotine dependence, cigarettes, uncomplicated: Secondary | ICD-10-CM | POA: Insufficient documentation

## 2018-04-21 DIAGNOSIS — I700 Atherosclerosis of aorta: Secondary | ICD-10-CM | POA: Diagnosis not present

## 2018-04-21 DIAGNOSIS — J449 Chronic obstructive pulmonary disease, unspecified: Secondary | ICD-10-CM | POA: Diagnosis not present

## 2018-04-21 DIAGNOSIS — Z79899 Other long term (current) drug therapy: Secondary | ICD-10-CM | POA: Diagnosis not present

## 2018-04-21 DIAGNOSIS — I255 Ischemic cardiomyopathy: Secondary | ICD-10-CM | POA: Diagnosis not present

## 2018-04-21 DIAGNOSIS — I4891 Unspecified atrial fibrillation: Secondary | ICD-10-CM | POA: Diagnosis not present

## 2018-04-21 NOTE — Progress Notes (Signed)
Pulmonary Individual Treatment Plan  Patient Details  Name: Brett Saunders  MRN: [id number] Date of Birth: [birth year] Referring Provider:     Pulmonary Rehab Walk Test from 03/16/2018 in [facility]  Referring Provider  Dr. Nelda Marseille      Initial Encounter Date:    Pulmonary Rehab Walk Test from 03/16/2018 in [facility]  Date  03/16/18      Visit Diagnosis: Chronic obstructive pulmonary disease, unspecified COPD type ([place])  Patient's Home Medications on Admission:   Current Outpatient Medications:  .  acetaminophen (TYLENOL) 500 MG tablet, Take 500 mg by mouth 2 (two) times daily., Disp: , Rfl:  .  amiodarone (PACERONE) 200 MG tablet, Take 100 mg by mouth daily. , Disp: , Rfl:  .  apixaban (ELIQUIS) 5 MG TABS tablet, Take 2.5 mg by mouth 2 (two) times daily., Disp: , Rfl:  .  azithromycin (ZITHROMAX Z-PAK) 250 MG tablet, 2 po day one, then 1 daily x 4 days (Patient not taking: Reported on 03/13/2018), Disp: 5 tablet, Rfl: 0 .  carvedilol (COREG) 3.125 MG tablet, Take 3.125 mg by mouth 2 (two) times daily with a meal., Disp: , Rfl:  .  diphenhydrAMINE (BENADRYL) 25 MG tablet, Take 25 mg by mouth at bedtime., Disp: , Rfl:  .  furosemide (LASIX) 20 MG tablet, Take 20 mg by mouth daily. , Disp: , Rfl:  .  Glycopyrrolate-Formoterol (BEVESPI AEROSPHERE) 9-4.8 MCG/ACT AERO, Inhale 2 puffs into the lungs 2 (two) times daily., Disp: , Rfl:  .  Liniments (SALONPAS PAIN RELIEF PATCH EX), Place 1 patch onto the skin daily as needed (pain)., Disp: , Rfl:  .  OLODATEROL HCL IN, Inhale 2.5 mg into the lungs every morning. 2 puffs, Disp: , Rfl:  .  oxyCODONE-acetaminophen (PERCOCET) 5-325 MG tablet, Take 1-2 tablets by mouth every 4 (four) hours as needed. (Patient not taking: Reported on 03/13/2018), Disp: 20 tablet, Rfl: 0 .  rOPINIRole (REQUIP) 0.25 MG tablet, Take 0.25 mg by mouth at bedtime. , Disp: , Rfl:  .  sodium chloride 1 g  tablet, Take 1 g by mouth 3 (three) times daily., Disp: , Rfl:   Past Medical History: Past Medical History:  Diagnosis Date  . Aortic anomaly    aortic calcification  . Arrhythmia    a fib  . Cancer ([place])    lung  . Chronic obstructive lung disease ([place])   . Hypertension   . Ischemic cardiomyopathy   . Peripheral vascular disease ([place])   . Stroke [facility])    oct 2012    Tobacco Use: Social History   Tobacco Use  Smoking Status Light Tobacco Smoker  . Packs/day: 0.10  . Types: Cigarettes  Smokeless Tobacco Never Used  Tobacco Comment   pt smokes 1 cigarrete occasionally. 1 pack "last a few months"    Labs: Recent Review Flowsheet Data    There is no flowsheet data to display.      Capillary Blood Glucose: No results found for: GLUCAP   Pulmonary Assessment Scores:   Pulmonary Function Assessment:   Exercise Target Goals: Exercise Program Goal: Individual exercise prescription set using results from initial 6 min walk test and THRR while considering  patient's activity barriers and safety.   Exercise Prescription Goal: Initial exercise prescription builds to 30-45 minutes a day of aerobic activity, 2-3 days per week.  Home exercise guidelines will be given to patient during program as part of exercise prescription that  the participant will acknowledge.  Activity Barriers & Risk Stratification: Activity Barriers & Cardiac Risk Stratification - 03/13/18 1203      Activity Barriers & Cardiac Risk Stratification   Activity Barriers  Back Problems;Balance Concerns;Shortness of Breath       6 Minute Walk: 6 Minute Walk    Row Name 03/17/18 0652         6 Minute Walk   Distance  1085 feet     Walk Time  6 minutes     # of Rest Breaks  0     MPH  2.05     METS  2.61     RPE  12     Perceived Dyspnea   3     Symptoms  No     Resting HR  64 bpm     Resting BP  114/64     Resting Oxygen Saturation   100 %     Exercise Oxygen Saturation  during 6 min  walk  94 %     Max Ex. HR  106 bpm     Max Ex. BP  122/62     2 Minute Post BP  116/64       Interval HR   1 Minute HR  97     2 Minute HR  106     3 Minute HR  104     4 Minute HR  105     5 Minute HR  105     6 Minute HR  104     2 Minute Post HR  88     Interval Heart Rate?  Yes       Interval Oxygen   Interval Oxygen?  Yes     Baseline Oxygen Saturation %  100 %     1 Minute Oxygen Saturation %  94 %     1 Minute Liters of Oxygen  0 L     2 Minute Oxygen Saturation %  94 %     2 Minute Liters of Oxygen  0 L     3 Minute Oxygen Saturation %  94 %     3 Minute Liters of Oxygen  0 L     4 Minute Oxygen Saturation %  96 %     4 Minute Liters of Oxygen  0 L     5 Minute Oxygen Saturation %  98 %     5 Minute Liters of Oxygen  0 L     6 Minute Oxygen Saturation %  98 %     6 Minute Liters of Oxygen  0 L     2 Minute Post Oxygen Saturation %  100 %     2 Minute Post Liters of Oxygen  0 L        Oxygen Initial Assessment: Oxygen Initial Assessment - 03/17/18 0651      Home Oxygen   Home Oxygen Device  Portable Concentrator;Home Concentrator    Sleep Oxygen Prescription  Continuous    Liters per minute  2    Home Exercise Oxygen Prescription  None    Home at Rest Exercise Oxygen Prescription  None    Compliance with Home Oxygen Use  Yes      Initial 6 min Walk   Oxygen Used  None      Program Oxygen Prescription   Program Oxygen Prescription  None      Intervention   Short Term Goals  To  learn and exhibit compliance with exercise, home and travel O2 prescription;To learn and understand importance of maintaining oxygen saturations>88%;To learn and demonstrate proper use of respiratory medications;To learn and understand importance of monitoring SPO2 with pulse oximeter and demonstrate accurate use of the pulse oximeter.;To learn and demonstrate proper pursed lip breathing techniques or other breathing techniques.    Long  Term Goals  Exhibits compliance with exercise,  home and travel O2 prescription;Verbalizes importance of monitoring SPO2 with pulse oximeter and return demonstration;Maintenance of O2 saturations>88%;Exhibits proper breathing techniques, such as pursed lip breathing or other method taught during program session;Compliance with respiratory medication;Demonstrates proper use of MDI's       Oxygen Re-Evaluation: Oxygen Re-Evaluation    Row Name 03/24/18 0725 04/21/18 0737           Program Oxygen Prescription   Program Oxygen Prescription  None  None        Home Oxygen   Home Oxygen Device  Portable Concentrator;Home Concentrator  Portable Concentrator;Home Concentrator      Sleep Oxygen Prescription  -  Continuous      Liters per minute  2  2      Home Exercise Oxygen Prescription  None  None      Home at Rest Exercise Oxygen Prescription  None  None      Compliance with Home Oxygen Use  Yes  Yes        Goals/Expected Outcomes   Short Term Goals  To learn and exhibit compliance with exercise, home and travel O2 prescription;To learn and understand importance of maintaining oxygen saturations>88%;To learn and demonstrate proper use of respiratory medications;To learn and understand importance of monitoring SPO2 with pulse oximeter and demonstrate accurate use of the pulse oximeter.;To learn and demonstrate proper pursed lip breathing techniques or other breathing techniques.  To learn and exhibit compliance with exercise, home and travel O2 prescription;To learn and understand importance of maintaining oxygen saturations>88%;To learn and demonstrate proper use of respiratory medications;To learn and understand importance of monitoring SPO2 with pulse oximeter and demonstrate accurate use of the pulse oximeter.;To learn and demonstrate proper pursed lip breathing techniques or other breathing techniques.      Long  Term Goals  Exhibits compliance with exercise, home and travel O2 prescription;Verbalizes importance of monitoring SPO2 with  pulse oximeter and return demonstration;Maintenance of O2 saturations>88%;Exhibits proper breathing techniques, such as pursed lip breathing or other method taught during program session;Compliance with respiratory medication;Demonstrates proper use of MDI's  Exhibits compliance with exercise, home and travel O2 prescription;Verbalizes importance of monitoring SPO2 with pulse oximeter and return demonstration;Maintenance of O2 saturations>88%;Exhibits proper breathing techniques, such as pursed lip breathing or other method taught during program session;Compliance with respiratory medication;Demonstrates proper use of MDI's      Goals/Expected Outcomes  compliance   compliance          Oxygen Discharge (Final Oxygen Re-Evaluation): Oxygen Re-Evaluation - 04/21/18 0737      Program Oxygen Prescription   Program Oxygen Prescription  None      Home Oxygen   Home Oxygen Device  Portable Concentrator;Home Concentrator    Sleep Oxygen Prescription  Continuous    Liters per minute  2    Home Exercise Oxygen Prescription  None    Home at Rest Exercise Oxygen Prescription  None    Compliance with Home Oxygen Use  Yes      Goals/Expected Outcomes   Short Term Goals  To learn and exhibit compliance with exercise, home  and travel O2 prescription;To learn and understand importance of maintaining oxygen saturations>88%;To learn and demonstrate proper use of respiratory medications;To learn and understand importance of monitoring SPO2 with pulse oximeter and demonstrate accurate use of the pulse oximeter.;To learn and demonstrate proper pursed lip breathing techniques or other breathing techniques.    Long  Term Goals  Exhibits compliance with exercise, home and travel O2 prescription;Verbalizes importance of monitoring SPO2 with pulse oximeter and return demonstration;Maintenance of O2 saturations>88%;Exhibits proper breathing techniques, such as pursed lip breathing or other method taught during program  session;Compliance with respiratory medication;Demonstrates proper use of MDI's    Goals/Expected Outcomes  compliance        Initial Exercise Prescription: Initial Exercise Prescription - 03/17/18 0600      Date of Initial Exercise RX and Referring Provider   Date  03/16/18    Referring Provider  Dr. Nelda Marseille      Bike   Level  0.4    Minutes  17      NuStep   Level  2    SPM  80    Minutes  17      Track   Laps  10    Minutes  17      Prescription Details   Frequency (times per week)  2    Duration  Progress to 45 minutes of aerobic exercise without signs/symptoms of physical distress      Intensity   THRR 40-80% of Max Heartrate  58-117    Ratings of Perceived Exertion  11-13    Perceived Dyspnea  0-4      Progression   Progression  Continue to progress workloads to maintain intensity without signs/symptoms of physical distress.      Resistance Training   Training Prescription  Yes    Weight  orange bands    Reps  10-15       Perform Capillary Blood Glucose checks as needed.  Exercise Prescription Changes:  Exercise Prescription Changes    Row Name 03/24/18 1400 04/07/18 1200 04/07/18 1300 04/21/18 1200       Response to Exercise   Blood Pressure (Admit)  106/62  110/72  -  118/68    Blood Pressure (Exercise)  118/60  100/64  -  110/60    Blood Pressure (Exit)  92/62  102/64  -  98/60    Heart Rate (Admit)  76 bpm  70 bpm  -  71 bpm    Heart Rate (Exercise)  110 bpm  106 bpm  -  108 bpm    Heart Rate (Exit)  85 bpm  90 bpm  -  68 bpm    Oxygen Saturation (Admit)  100 %  100 %  -  100 %    Oxygen Saturation (Exercise)  95 %  95 %  -  95 %    Oxygen Saturation (Exit)  95 %  90 %  -  99 %    Rating of Perceived Exertion (Exercise)  15  9  -  12    Perceived Dyspnea (Exercise)  2  1  -  2    Duration  Progress to 45 minutes of aerobic exercise without signs/symptoms of physical distress  Progress to 45 minutes of aerobic exercise without signs/symptoms of  physical distress  -  Progress to 45 minutes of aerobic exercise without signs/symptoms of physical distress    Intensity  Other (comment) 40-80% of HRR  THRR unchanged  -  THRR unchanged  Progression   Progression  Continue to progress workloads to maintain intensity without signs/symptoms of physical distress.  Continue to progress workloads to maintain intensity without signs/symptoms of physical distress.  -  Continue to progress workloads to maintain intensity without signs/symptoms of physical distress.      Resistance Training   Training Prescription  Yes  Yes  -  Yes    Weight  orange bands  orange bands  -  orange bands    Reps  10-15  10-15  -  10-15    Time  10 Minutes  10 Minutes  -  10 Minutes      Interval Training   Interval Training  -  No  -  -      Bike   Level  0.3  -  -  0.6    Minutes  17  -  -  17      NuStep   Level  2  2  -  3    SPM  80  80  -  80    Minutes  17  17  -  17    METs  2.1  1.6  -  2.3      Track   Laps  10  10  -  16    Minutes  17  17  -  17      Home Exercise Plan   Plans to continue exercise at  -  -  [employer] (comment) get a gym membership  -    Frequency  -  -  Add 2 additional days to program exercise sessions.  -    Initial Home Exercises Provided  -  -  04/07/18  -       Exercise Comments:  Exercise Comments    Row Name 04/07/18 1317           Exercise Comments  home exercise complete          Exercise Goals and Review:  Exercise Goals    [place] Name 03/13/18 1208             Exercise Goals   Increase Physical Activity  Yes       Intervention  Provide advice, education, support and counseling about physical activity/exercise needs.;Develop an individualized exercise prescription for aerobic and resistive training based on initial evaluation findings, risk stratification, comorbidities and participant's personal goals.       Expected Outcomes  Short Term: Attend rehab on a regular basis to increase  amount of physical activity.;Long Term: Add in home exercise to make exercise part of routine and to increase amount of physical activity.;Long Term: Exercising regularly at least 3-5 days a week.       Increase Strength and Stamina  Yes       Intervention  Provide advice, education, support and counseling about physical activity/exercise needs.;Develop an individualized exercise prescription for aerobic and resistive training based on initial evaluation findings, risk stratification, comorbidities and participant's personal goals.       Expected Outcomes  Short Term: Increase workloads from initial exercise prescription for resistance, speed, and METs.;Short Term: Perform resistance training exercises routinely during rehab and add in resistance training at home;Long Term: Improve cardiorespiratory fitness, muscular endurance and strength as measured by increased METs and functional capacity (6MWT)       Able to understand and use rate of perceived exertion (RPE) scale  Yes       Intervention  Provide  education and explanation on how to use RPE scale       Expected Outcomes  Short Term: Able to use RPE daily in rehab to express subjective intensity level;Long Term:  Able to use RPE to guide intensity level when exercising independently       Able to understand and use Dyspnea scale  Yes       Intervention  Provide education and explanation on how to use Dyspnea scale       Expected Outcomes  Short Term: Able to use Dyspnea scale daily in rehab to express subjective sense of shortness of breath during exertion;Long Term: Able to use Dyspnea scale to guide intensity level when exercising independently       Knowledge and understanding of Target Heart Rate Range (THRR)  Yes       Intervention  Provide education and explanation of THRR including how the numbers were predicted and where they are located for reference       Expected Outcomes  Short Term: Able to state/look up THRR;Long Term: Able to use THRR  to govern intensity when exercising independently;Short Term: Able to use daily as guideline for intensity in rehab       Understanding of Exercise Prescription  Yes       Intervention  Provide education, explanation, and written materials on patient's individual exercise prescription       Expected Outcomes  Short Term: Able to explain program exercise prescription;Long Term: Able to explain home exercise prescription to exercise independently       Improve claudication pain toleration; Improve walking ability  Yes       Intervention  Participate in PAD/SET Rehab 2-3 days a week, walking at home as part of exercise prescription;Attend education sessions to aid in risk factor modification and understanding of disease process       Expected Outcomes  Short Term: Improve walking distance/time to onset of claudication pain;Long Term: Improve walking ability and toleration to claudication;Long Term: Improve score of PAD questionnaires          Exercise Goals Re-Evaluation : Exercise Goals Re-Evaluation    Row Name 03/24/18 0726 04/21/18 0737           Exercise Goal Re-Evaluation   Exercise Goals Review  Increase Physical Activity;Increase Strength and Stamina;Able to understand and use rate of perceived exertion (RPE) scale;Able to understand and use Dyspnea scale;Knowledge and understanding of Target Heart Rate Range (THRR);Understanding of Exercise Prescription  Increase Physical Activity;Increase Strength and Stamina;Able to understand and use rate of perceived exertion (RPE) scale;Able to understand and use Dyspnea scale;Knowledge and understanding of Target Heart Rate Range (THRR);Understanding of Exercise Prescription      Comments  Pt will begin his first exercise session today. Will monitor and progress as able through the program.  Pt is highly motivated to exercise. After I consulted with pt about home exercise, he went and got a membership at [employer]. Pt is exercising at 2.4 METs and  walking 14 laps (1 lap=200 ft). I expect the pt will progress well. Will continue to monitor and progress as able.       Expected Outcomes  Through exercise at rehab and at home, the patient will decrease shortness of breath with daily activities and feel confident in carrying out an exercise regime at home.   Through exercise at rehab and at home, the patient will decrease shortness of breath with daily activities and feel confident in carrying out an exercise regime at  home.          Discharge Exercise Prescription (Final Exercise Prescription Changes): Exercise Prescription Changes - 04/21/18 1200      Response to Exercise   Blood Pressure (Admit)  118/68    Blood Pressure (Exercise)  110/60    Blood Pressure (Exit)  98/60    Heart Rate (Admit)  71 bpm    Heart Rate (Exercise)  108 bpm    Heart Rate (Exit)  68 bpm    Oxygen Saturation (Admit)  100 %    Oxygen Saturation (Exercise)  95 %    Oxygen Saturation (Exit)  99 %    Rating of Perceived Exertion (Exercise)  12    Perceived Dyspnea (Exercise)  2    Duration  Progress to 45 minutes of aerobic exercise without signs/symptoms of physical distress    Intensity  THRR unchanged      Progression   Progression  Continue to progress workloads to maintain intensity without signs/symptoms of physical distress.      Resistance Training   Training Prescription  Yes    Weight  orange bands    Reps  10-15    Time  10 Minutes      Bike   Level  0.6    Minutes  17      NuStep   Level  3    SPM  80    Minutes  17    METs  2.3      Track   Laps  16    Minutes  17       Nutrition:  Target Goals: Understanding of nutrition guidelines, daily intake of sodium <1574m, cholesterol <2013m calories 30% from fat and 7% or less from saturated fats, daily to have 5 or more servings of fruits and vegetables.  Biometrics: Pre Biometrics - 03/13/18 1204      Pre Biometrics   Grip Strength  30 kg        Nutrition Therapy Plan and  Nutrition Goals:   Nutrition Assessments:   Nutrition Goals Re-Evaluation:   Nutrition Goals Discharge (Final Nutrition Goals Re-Evaluation):   Psychosocial: Target Goals: Acknowledge presence or absence of significant depression and/or stress, maximize coping skills, provide positive support system. Participant is able to verbalize types and ability to use techniques and skills needed for reducing stress and depression.  Initial Review & Psychosocial Screening: Initial Psych Review & Screening - 03/13/18 1212      Initial Review   Current issues with  None Identified      Family Dynamics   Good Support System?  Yes      Barriers   Psychosocial barriers to participate in program  There are no identifiable barriers or psychosocial needs.;The patient should benefit from training in stress management and relaxation.      Screening Interventions   Interventions  Encouraged to exercise;To provide support and resources with identified psychosocial needs    Expected Outcomes  Long Term Goal: Stressors or current issues are controlled or eliminated.;Short Term goal: Identification and review with participant of any Quality of Life or Depression concerns found by scoring the questionnaire.;Long Term goal: The participant improves quality of Life and PHQ9 Scores as seen by post scores and/or verbalization of changes       Quality of Life Scores:  Scores of 19 and below usually indicate a poorer quality of life in these areas.  A difference of  2-3 points is a clinically meaningful difference.  A difference  of 2-3 points in the total score of the Quality of Life Index has been associated with significant improvement in overall quality of life, self-image, physical symptoms, and general health in studies assessing change in quality of life.  PHQ-9: Recent Review Flowsheet Data    Depression screen [facility] 2/9 03/13/2018 03/13/2018   Decreased Interest 0 0   Down, Depressed, Hopeless 0 0    PHQ - 2 Score 0 0   Altered sleeping 0 0   Tired, decreased energy 3 3   Change in appetite 3 3   Feeling bad or failure about yourself  0 0   Trouble concentrating 0 0   Moving slowly or fidgety/restless 0 0   Suicidal thoughts 0 0   PHQ-9 Score 6 6   Difficult doing work/chores Somewhat difficult Somewhat difficult     Interpretation of Total Score  Total Score Depression Severity:  1-4 = Minimal depression, 5-9 = Mild depression, 10-14 = Moderate depression, 15-19 = Moderately severe depression, 20-27 = Severe depression   Psychosocial Evaluation and Intervention: Psychosocial Evaluation - 04/21/18 0956      Psychosocial Evaluation & Interventions   Interventions  Stress management education;Relaxation education;Encouraged to exercise with the program and follow exercise prescription    Comments  Pt with recent move from [place] to [place]. good support from wife and grandaughter.    Expected Outcomes  Pt will continue to display positve and healthy coping skills by his family    Continue Psychosocial Services   Follow up required by staff       Psychosocial Re-Evaluation: Psychosocial Re-Evaluation    [place] Name 03/25/18 1645 04/21/18 0956           Psychosocial Re-Evaluation   Current issues with  Current Stress Concerns  Current Stress Concerns      Comments  Pt has recently moved from [place] to [place]. Lives with wife who is a heavy smoker. Pt has moved to be closer to grandaughter, who he states does not come around very often. Pt states that he has a "shop" that he goes out to when his wife smoking bothers him or when he needs to get away.  Pt has recently moved from [place] to [place]. Pt is getting acclimated and is settling in to life in [place]. Lives with wife who is a heavy smoker. Pt has moved to be closer to grandaughter, who he states does not come around very often. Pt states that he has a "shop" that he goes out to when his wife smoking bothers him or when he needs to get away.      Expected  Outcomes  pt will become more comfortable with [place] and will maintain coping skills to deal with current stressors.  pt will become more comfortable with [place] and will maintain coping skills to deal with current stressors.      Interventions  Relaxation education;Stress management education;Encouraged to attend Pulmonary Rehabilitation for the exercise  Relaxation education;Stress management education;Encouraged to attend Pulmonary Rehabilitation for the exercise      Continue Psychosocial Services   Follow up required by staff  -      Comments  pt has moved to [facility] for better air quality to deal with illness  -        Initial Review   Source of Stress Concerns  Chronic Illness  -         Psychosocial Discharge (Final Psychosocial Re-Evaluation): Psychosocial Re-Evaluation - 04/21/18 4315  Psychosocial Re-Evaluation   Current issues with  Current Stress Concerns    Comments  Pt has recently moved from [place] to [place]. Pt is getting acclimated and is settling in to life in [place]. Lives with wife who is a heavy smoker. Pt has moved to be closer to grandaughter, who he states does not come around very often. Pt states that he has a "shop" that he goes out to when his wife smoking bothers him or when he needs to get away.    Expected Outcomes  pt will become more comfortable with [place] and will maintain coping skills to deal with current stressors.    Interventions  Relaxation education;Stress management education;Encouraged to attend Pulmonary Rehabilitation for the exercise       Education: Education Goals: Education classes will be provided on a weekly basis, covering required topics. Participant will state understanding/return demonstration of topics presented.  Learning Barriers/Preferences: Learning Barriers/Preferences - 03/13/18 1218      Learning Barriers/Preferences   Learning Barriers  Sight;Hearing    Learning Preferences  Written Material;Computer/Internet;Group Instruction       Education  Topics: Risk Factor Reduction:  -Group instruction that is supported by a PowerPoint presentation. Instructor discusses the definition of a risk factor, different risk factors for pulmonary disease, and how the heart and lungs work together.     Nutrition for Pulmonary Patient:  -Group instruction provided by PowerPoint slides, verbal discussion, and written materials to support subject matter. The instructor gives an explanation and review of healthy diet recommendations, which includes a discussion on weight management, recommendations for fruit and vegetable consumption, as well as protein, fluid, caffeine, fiber, sodium, sugar, and alcohol. Tips for eating when patients are short of breath are discussed.   PULMONARY REHAB CHRONIC OBSTRUCTIVE PULMONARY DISEASE from 04/16/2018 in [place]  Date  03/26/18  Educator  Rodman Pickle  Instruction Review Code  1- Verbalizes Understanding      Pursed Lip Breathing:  -Group instruction that is supported by demonstration and informational handouts. Instructor discusses the benefits of pursed lip and diaphragmatic breathing and detailed demonstration on how to preform both.     Oxygen Safety:  -Group instruction provided by PowerPoint, verbal discussion, and written material to support subject matter. There is an overview of "What is Oxygen" and "Why do we need it".  Instructor also reviews how to create a safe environment for oxygen use, the importance of using oxygen as prescribed, and the risks of noncompliance. There is a brief discussion on traveling with oxygen and resources the patient may utilize.   Oxygen Equipment:  -Group instruction provided by [facility] Staff utilizing handouts, written materials, and equipment demonstrations.   Signs and Symptoms:  -Group instruction provided by written material and verbal discussion to support subject matter. Warning signs and symptoms of infection, stroke, and heart  attack are reviewed and when to call the physician/911 reinforced. Tips for preventing the spread of infection discussed.   Advanced Directives:  -Group instruction provided by verbal instruction and written material to support subject matter. Instructor reviews Advanced Directive laws and proper instruction for filling out document.   Pulmonary Video:  -Group video education that reviews the importance of medication and oxygen compliance, exercise, good nutrition, pulmonary hygiene, and pursed lip and diaphragmatic breathing for the pulmonary patient.   Exercise for the Pulmonary Patient:  -Group instruction that is supported by a PowerPoint presentation. Instructor discusses benefits of exercise, core components of exercise, frequency, duration, and  intensity of an exercise routine, importance of utilizing pulse oximetry during exercise, safety while exercising, and options of places to exercise outside of rehab.     Pulmonary Medications:  -Verbally interactive group education provided by instructor with focus on inhaled medications and proper administration.   PULMONARY REHAB CHRONIC OBSTRUCTIVE PULMONARY DISEASE from 04/16/2018 in [place]  Date  04/07/18  Educator  pharmacy  Instruction Review Code  1- Verbalizes Understanding      Anatomy and Physiology of the Respiratory System and Intimacy:  -Group instruction provided by PowerPoint, verbal discussion, and written material to support subject matter. Instructor reviews respiratory cycle and anatomical components of the respiratory system and their functions. Instructor also reviews differences in obstructive and restrictive respiratory diseases with examples of each. Intimacy, Sex, and Sexuality differences are reviewed with a discussion on how relationships can change when diagnosed with pulmonary disease. Common sexual concerns are reviewed.   PULMONARY REHAB CHRONIC OBSTRUCTIVE PULMONARY DISEASE  from 04/16/2018 in [place]  Date  04/16/18  Educator  rn  Instruction Review Code  1- Verbalizes Understanding      MD DAY -A group question and answer session with a medical doctor that allows participants to ask questions that relate to their pulmonary disease state.   OTHER EDUCATION -Group or individual verbal, written, or video instructions that support the educational goals of the pulmonary rehab program.   Holiday Eating Survival Tips:  -Group instruction provided by PowerPoint slides, verbal discussion, and written materials to support subject matter. The instructor gives patients tips, tricks, and techniques to help them not only survive but enjoy the holidays despite the onslaught of food that accompanies the holidays.   Knowledge Questionnaire Score:   Core Components/Risk Factors/Patient Goals at Admission: Personal Goals and Risk Factors at Admission - 03/13/18 1219      Core Components/Risk Factors/Patient Goals on Admission    Weight Management  Weight Gain    Tobacco Cessation  Yes    Number of packs per day  vaping and occasional cigarette use    Intervention  Assist the participant in steps to quit. Provide individualized education and counseling about committing to Tobacco Cessation, relapse prevention, and pharmacological support that can be provided by physician.;Advice worker, assist with locating and accessing local/national Quit Smoking programs, and support quit date choice.    Expected Outcomes  Short Term: Will demonstrate readiness to quit, by selecting a quit date.;Short Term: Will quit all tobacco product use, adhering to prevention of relapse plan.    Improve shortness of breath with ADL's  Yes    Intervention  Provide education, individualized exercise plan and daily activity instruction to help decrease symptoms of SOB with activities of daily living.    Expected Outcomes  Short Term: Improve  cardiorespiratory fitness to achieve a reduction of symptoms when performing ADLs;Long Term: Be able to perform more ADLs without symptoms or delay the onset of symptoms    Stress  Yes    Intervention  Offer individual and/or small group education and counseling on adjustment to heart disease, stress management and health-related lifestyle change. Teach and support self-help strategies.;Refer participants experiencing significant psychosocial distress to appropriate mental health specialists for further evaluation and treatment. When possible, include family members and significant others in education/counseling sessions.    Expected Outcomes  Short Term: Participant demonstrates changes in health-related behavior, relaxation and other stress management skills, ability to obtain effective social support, and compliance  with psychotropic medications if prescribed.;Long Term: Emotional wellbeing is indicated by absence of clinically significant psychosocial distress or social isolation.       Core Components/Risk Factors/Patient Goals Review:  Goals and Risk Factor Review    Row Name 03/13/18 1222 03/25/18 1653 04/21/18 0957         Core Components/Risk Factors/Patient Goals Review   Personal Goals Review  Weight Management/Obesity;Tobacco Cessation;Improve shortness of breath with ADL's;Develop more efficient breathing techniques such as purse lipped breathing and diaphragmatic breathing and practicing self-pacing with activity.;Increase knowledge of respiratory medications and ability to use respiratory devices properly.;Stress  Weight Management/Obesity;Tobacco Cessation;Improve shortness of breath with ADL's;Develop more efficient breathing techniques such as purse lipped breathing and diaphragmatic breathing and practicing self-pacing with activity.;Increase knowledge of respiratory medications and ability to use respiratory devices properly.;Stress  Weight Management/Obesity;Tobacco  Cessation;Improve shortness of breath with ADL's;Develop more efficient breathing techniques such as purse lipped breathing and diaphragmatic breathing and practicing self-pacing with activity.;Increase knowledge of respiratory medications and ability to use respiratory devices properly.;Stress     Review  -  Pt has completes 1 day of exercise and no education. Pt completed 10 laps around the track, a level 2 on the nustep, and a level of 0.3 on the airdyne. Pt reports no changes in smoking habits, but will hopefully decrease use. pt will learn PLB techniques and implement then with the assistance of staff. Anticipate will moke progress for the next 30 day review.   Pt has completes 5 day of exercise and 3 education classes. Pt workloads of 11-14 laps around the track, a increase to level 3 on the nustep, and an increase to level 0.4 on the airdyne. Pt reports no changes in smoking habits, but will hopefully decrease use. pt will learn PLB techniques and implement then with the assistance of staff. Anticipate will moke progress for the next 30 day review.      Expected Outcomes  -  see admission goals and outcomes  see admission goals and outcomes        Core Components/Risk Factors/Patient Goals at Discharge (Final Review):  Goals and Risk Factor Review - 04/21/18 0957      Core Components/Risk Factors/Patient Goals Review   Personal Goals Review  Weight Management/Obesity;Tobacco Cessation;Improve shortness of breath with ADL's;Develop more efficient breathing techniques such as purse lipped breathing and diaphragmatic breathing and practicing self-pacing with activity.;Increase knowledge of respiratory medications and ability to use respiratory devices properly.;Stress    Review  Pt has completes 5 day of exercise and 3 education classes. Pt workloads of 11-14 laps around the track, a increase to level 3 on the nustep, and an increase to level 0.4 on the airdyne. Pt reports no changes in smoking  habits, but will hopefully decrease use. pt will learn PLB techniques and implement then with the assistance of staff. Anticipate will moke progress for the next 30 day review.     Expected Outcomes  see admission goals and outcomes       ITP Comments: ITP Comments    Row Name 03/13/18 1126 03/25/18 1643 04/21/18 0955       ITP Comments  Dr. Manfred Arch medical director for pulmonary rehab.  Dr. Manfred Arch medical director for pulmonary rehab.  Dr. Manfred Arch medical director for pulmonary rehab.        Comments: ITP REVIEW Pt is making expected progress toward personal goals after completing 5 sessions. Recommend continued exercise, life style modification, education, and utilization of breathing  techniques to increase stamina and strength and decrease shortness of breath with exertion.  Joycelyn Man RN, BSN Cardiac and Pulmonary Rehab RN

## 2018-04-21 NOTE — Progress Notes (Signed)
Daily Session Note  Patient Details  Name: Brett Saunders MRN: [id number] Date of Birth: 09-Oct-1945 Referring Provider:     Pulmonary Rehab Walk Test from 03/16/2018 in [place]  Referring Provider  Dr. Nelda Marseille      Encounter Date: 04/21/2018  Check In: Session Check In - 04/21/18 1035      Check-In   Supervising physician immediately available to respond to emergencies  [facility] immediately available    Physician(s)  Dr. Tawanna Solo    Location  [facility]    Staff Present  Joycelyn Man RN, Maxcine Ham, RN, BSN;Carlette Wilber Oliphant, RN, Bjorn Loser, MS, Exercise Physiologist    Medication changes reported      No    Fall or balance concerns reported     No    Tobacco Cessation  No Change    Warm-up and Cool-down  Performed as group-led instruction    Resistance Training Performed  Yes    VAD Patient?  No    PAD/SET Patient?  No      Pain Assessment   Currently in Pain?  No/denies    Pain Score  0-No pain    Multiple Pain Sites  No       Capillary Blood Glucose: No results found for this or any previous visit (from the past 24 hour(s)).  Exercise Prescription Changes - 04/21/18 1200      Response to Exercise   Blood Pressure (Admit)  118/68    Blood Pressure (Exercise)  110/60    Blood Pressure (Exit)  98/60    Heart Rate (Admit)  71 bpm    Heart Rate (Exercise)  108 bpm    Heart Rate (Exit)  68 bpm    Oxygen Saturation (Admit)  100 %    Oxygen Saturation (Exercise)  95 %    Oxygen Saturation (Exit)  99 %    Rating of Perceived Exertion (Exercise)  12    Perceived Dyspnea (Exercise)  2    Duration  Progress to 45 minutes of aerobic exercise without signs/symptoms of physical distress    Intensity  THRR unchanged      Progression   Progression  Continue to progress workloads to maintain intensity without signs/symptoms of physical distress.      Resistance Training   Training Prescription  Yes     Weight  orange bands    Reps  10-15    Time  10 Minutes      Bike   Level  0.6    Minutes  17      NuStep   Level  3    SPM  80    Minutes  17    METs  2.3      Track   Laps  16    Minutes  17       Social History   Tobacco Use  Smoking Status Light Tobacco Smoker  . Packs/day: 0.10  . Types: Cigarettes  Smokeless Tobacco Never Used  Tobacco Comment   pt smokes 1 cigarrete occasionally. 1 pack "last a few months"    Goals Met:  Proper associated with RPD/PD & O2 Sat Exercise tolerated well Strength training completed today  Goals Unmet:  Not Applicable  Comments: Service time is from 1030 to 1210    Dr. Rush Farmer is Medical Director for Pulmonary Rehab at [facility].

## 2018-04-23 ENCOUNTER — Encounter (HOSPITAL_COMMUNITY)
Admission: RE | Admit: 2018-04-23 | Discharge: 2018-04-23 | Disposition: A | Discharge status: Home / Self Care | Payer: No Typology Code available for payment source | Source: Ambulatory Visit | Attending: Pulmonary Disease | Admitting: Pulmonary Disease

## 2018-04-23 DIAGNOSIS — J449 Chronic obstructive pulmonary disease, unspecified: Secondary | ICD-10-CM | POA: Diagnosis not present

## 2018-04-23 NOTE — Progress Notes (Signed)
Daily Session Note  Patient Details  Name: Brett Saunders MRN: [id number] Date of Birth: Nov 24, 1945 Referring Provider:     Pulmonary Rehab Walk Test from 03/16/2018 in [place]  Referring Provider  Dr. Nelda Marseille      Encounter Date: 04/23/2018  Check In: Session Check In - 04/23/18 1055      Check-In   Supervising physician immediately available to respond to emergencies  [facility] immediately available    Physician(s)  Dr. Broadus John    Location  [facility]    Staff Present  Joycelyn Man RN, BSN;Carlette Wilber Oliphant, RN, BSN;Phiona Ramnauth Ysidro Evert, RN;Dalton Kris Mouton, MS, Exercise Physiologist;Molly DiVincenzo, MS, ACSM RCEP, Exercise Physiologist    Medication changes reported      No    Fall or balance concerns reported     No    Tobacco Cessation  No Change    Warm-up and Cool-down  Performed as group-led instruction    Resistance Training Performed  Yes    VAD Patient?  No    PAD/SET Patient?  No      Pain Assessment   Currently in Pain?  No/denies    Pain Score  0-No pain    Multiple Pain Sites  No       Capillary Blood Glucose: No results found for this or any previous visit (from the past 24 hour(s)).    Social History   Tobacco Use  Smoking Status Light Tobacco Smoker  . Packs/day: 0.10  . Types: Cigarettes  Smokeless Tobacco Never Used  Tobacco Comment   pt smokes 1 cigarrete occasionally. 1 pack "last a few months"    Goals Met:  Exercise tolerated well No report of cardiac concerns or symptoms Strength training completed today  Goals Unmet:  Not Applicable  Comments: Service time is from 1030 to 1215    Dr. Rush Farmer is Medical Director for Pulmonary Rehab at [facility].

## 2018-04-28 ENCOUNTER — Encounter (HOSPITAL_COMMUNITY)
Admission: RE | Admit: 2018-04-28 | Discharge: 2018-04-28 | Disposition: A | Discharge status: Home / Self Care | Payer: No Typology Code available for payment source | Source: Ambulatory Visit | Attending: Pulmonary Disease | Admitting: Pulmonary Disease

## 2018-04-28 DIAGNOSIS — J449 Chronic obstructive pulmonary disease, unspecified: Secondary | ICD-10-CM | POA: Diagnosis not present

## 2018-04-28 NOTE — Progress Notes (Signed)
Daily Session Note  Patient Details  Name: Brett Saunders MRN: [id number] Date of Birth: 1945/04/20 Referring Provider:     Pulmonary Rehab Walk Test from 03/16/2018 in [place]  Referring Provider  Dr. Nelda Marseille      Encounter Date: 04/28/2018  Check In: Session Check In - 04/28/18 1019      Check-In   Supervising physician immediately available to respond to emergencies  [facility] immediately available    Physician(s)  Dr. Broadus John    Location  [facility]    Staff Present  Rosebud Poles, RN, BSN;Carlette Wilber Oliphant, RN, BSN;Lisa Ysidro Evert, RN;Porscha Axley Kris Mouton, MS, Exercise Physiologist;Molly DiVincenzo, MS, ACSM RCEP, Exercise Physiologist    Medication changes reported      No    Fall or balance concerns reported     No    Tobacco Cessation  No Change    Warm-up and Cool-down  Performed as group-led instruction    Resistance Training Performed  Yes    VAD Patient?  No    PAD/SET Patient?  No      Pain Assessment   Currently in Pain?  No/denies    Multiple Pain Sites  No       Capillary Blood Glucose: No results found for this or any previous visit (from the past 24 hour(s)).    Social History   Tobacco Use  Smoking Status Light Tobacco Smoker  . Packs/day: 0.10  . Types: Cigarettes  Smokeless Tobacco Never Used  Tobacco Comment   pt smokes 1 cigarrete occasionally. 1 pack "last a few months"    Goals Met:  Exercise tolerated well  Goals Unmet:  Not Applicable  Comments: Service time is from 1030 to 1200    Dr. Rush Farmer is Medical Director for Pulmonary Rehab at [facility].

## 2018-04-30 ENCOUNTER — Encounter (HOSPITAL_COMMUNITY)
Admission: RE | Admit: 2018-04-30 | Discharge: 2018-04-30 | Disposition: A | Discharge status: Home / Self Care | Payer: No Typology Code available for payment source | Source: Ambulatory Visit | Attending: Pulmonary Disease | Admitting: Pulmonary Disease

## 2018-04-30 VITALS — Ht 68.74 in | Wt 119.3 lb

## 2018-04-30 DIAGNOSIS — J449 Chronic obstructive pulmonary disease, unspecified: Secondary | ICD-10-CM

## 2018-04-30 NOTE — Progress Notes (Signed)
Daily Session Note  Patient Details  Name: Brett Saunders MRN: [id number] Date of Birth: [birth year] Referring Provider:     Pulmonary Rehab Walk Test from 03/16/2018 in [place]  Referring Provider  Dr. Nelda Marseille      Encounter Date: 04/30/2018  Check In: Session Check In - 04/30/18 1057      Check-In   Supervising physician immediately available to respond to emergencies  [facility] immediately available    Physician(s)  Dr. Posey Pronto    Location  [facility]    Staff Present  Rosebud Poles, RN, BSN;Molly DiVincenzo, MS, ACSM RCEP, Exercise Physiologist;Dalton Kris Mouton, MS, Exercise Physiologist;Lisa Ysidro Evert, RN    Medication changes reported      No    Fall or balance concerns reported     No    Tobacco Cessation  No Change    Warm-up and Cool-down  Performed as group-led instruction    Resistance Training Performed  Yes    VAD Patient?  No    PAD/SET Patient?  No      Pain Assessment   Currently in Pain?  No/denies    Pain Score  0-No pain    Multiple Pain Sites  No       Capillary Blood Glucose: No results found for this or any previous visit (from the past 24 hour(s)).    Social History   Tobacco Use  Smoking Status Light Tobacco Smoker  . Packs/day: 0.10  . Types: Cigarettes  Smokeless Tobacco Never Used  Tobacco Comment   pt smokes 1 cigarrete occasionally. 1 pack "last a few months"    Goals Met:  Proper associated with RPD/PD & O2 Sat Exercise tolerated well Strength training completed today  Goals Unmet:  Not Applicable  Comments: Service time is from 1030 to 1215    Dr. Rush Farmer is Medical Director for Pulmonary Rehab at [facility].

## 2018-04-30 NOTE — Progress Notes (Signed)
Brett Saunders 73 y.o. male   DOB: 1945/09/17 MRN: [id number]          Nutrition 1. Chronic obstructive pulmonary disease, unspecified COPD type ([place])    Past Medical History:  Diagnosis Date  . Aortic anomaly    aortic calcification  . Arrhythmia    a fib  . Cancer ([place])    lung  . Chronic obstructive lung disease ([place])   . Hypertension   . Ischemic cardiomyopathy   . Peripheral vascular disease ([place])   . Stroke [facility])    oct 2012     Meds reviewed.   Current Outpatient Medications (Cardiovascular):  .  amiodarone (PACERONE) 200 MG tablet, Take 100 mg by mouth daily.  .  carvedilol (COREG) 3.125 MG tablet, Take 3.125 mg by mouth 2 (two) times daily with a meal. .  furosemide (LASIX) 20 MG tablet, Take 20 mg by mouth daily.   Current Outpatient Medications (Respiratory):  .  diphenhydrAMINE (BENADRYL) 25 MG tablet, Take 25 mg by mouth at bedtime. .  Glycopyrrolate-Formoterol (BEVESPI AEROSPHERE) 9-4.8 MCG/ACT AERO, Inhale 2 puffs into the lungs 2 (two) times daily. .  OLODATEROL HCL IN, Inhale 2.5 mg into the lungs every morning. 2 puffs  Current Outpatient Medications (Analgesics):  .  acetaminophen (TYLENOL) 500 MG tablet, Take 500 mg by mouth 2 (two) times daily. Marland Kitchen  oxyCODONE-acetaminophen (PERCOCET) 5-325 MG tablet, Take 1-2 tablets by mouth every 4 (four) hours as needed. (Patient not taking: Reported on 03/13/2018)  Current Outpatient Medications (Hematological):  .  apixaban (ELIQUIS) 5 MG TABS tablet, Take 2.5 mg by mouth 2 (two) times daily.  Current Outpatient Medications (Other):  .  azithromycin (ZITHROMAX Z-PAK) 250 MG tablet, 2 po day one, then 1 daily x 4 days (Patient not taking: Reported on 03/13/2018) .  Liniments (SALONPAS PAIN RELIEF PATCH EX), Place 1 patch onto the skin daily as needed (pain). Marland Kitchen  rOPINIRole (REQUIP) 0.25 MG tablet, Take 0.25 mg by mouth at bedtime.  .  sodium chloride 1 g tablet, Take 1 g by mouth 3 (three) times daily.  Ht: Ht  Readings from Last 1 Encounters:  04/30/18 5' 8.74" (1.746 m)     Wt:  Wt Readings from Last 3 Encounters:  04/30/18 119 lb 4.3 oz (54.1 kg)  04/21/18 118 lb 13.3 oz (53.9 kg)  04/07/18 117 lb 4.6 oz (53.2 kg)     BMI: Body mass index is 17.75 kg/m.     Current tobacco use? No  Labs:  Lipid Panel  No results found for: CHOL, TRIG, HDL, CHOLHDL, VLDL, LDLCALC, LDLDIRECT  No results found for: HGBA1C Note Spoke with pt. Pt is underweight, BMI 17.75. Pt eats 3 meals a day; most prepared at home.  Making healthy food choices the majority of the time.  Pt's Rate Your Plate results reviewed with pt. Discussed with patient the need to eat more frequently across the day, and the importance of increasing protein and high calorie foods to help with weight gain. Pt has been working on increasing these since first visit, has gained 2 lbs. Praised pt for making these changes and encouraged him to continue to keep up the great work. Distributed high calorie, high protein recipe ideas to pt. Pt expressed understanding of the information reviewed.  Nutrition Diagnosis ? Underweight related to inadequate energy intake as evidenced by a BMI of 17.75  Nutrition Intervention ? Pt's individual nutrition plan and goals reviewed with pt. ? Benefits of adopting high protein high  calorie discussed when pt's Rate Your Plate reviewed.  Goal(s) 1. The pt will recognize symptoms that can interfere with adequate oral intake, such as shortness of breath, N/V, early satiety, fatigue, ability to secure and prepare food, taste and smell changes, chewing/swallowing difficulties, and/ or pain when eating. 2. The pt will consume high-energy, high-nutrient dense beverages when necessary to compensate for decreased oral intake of solid foods. 3. The pt will have family and friends shop for food when necessary so that nourishing foods are always available at home. 4. Identify food quantities necessary to achieve wt gain  of  -2# per week to a goal wt gain of 5-24 lb at graduation from pulmonary rehab.  Plan:  Pt to attend Pulmonary Nutrition class Will provide client-centered nutrition education as part of interdisciplinary care.    Monitor and Evaluate progress toward nutrition goal with team.   Laurina Bustle, MS, RD, LDN 04/30/2018 12:18 PM

## 2018-05-05 ENCOUNTER — Encounter (HOSPITAL_COMMUNITY)
Admission: RE | Admit: 2018-05-05 | Discharge: 2018-05-05 | Disposition: A | Discharge status: Home / Self Care | Payer: No Typology Code available for payment source | Source: Ambulatory Visit | Attending: Pulmonary Disease | Admitting: Pulmonary Disease

## 2018-05-05 VITALS — Wt 119.9 lb

## 2018-05-05 DIAGNOSIS — J449 Chronic obstructive pulmonary disease, unspecified: Secondary | ICD-10-CM | POA: Diagnosis not present

## 2018-05-05 NOTE — Progress Notes (Signed)
Daily Session Note  Patient Details  Name: Brett Saunders MRN: [id number] Date of Birth: Jul 23, 1945 Referring Provider:     Pulmonary Rehab Walk Test from 03/16/2018 in [place]  Referring Provider  Dr. Nelda Marseille      Encounter Date: 05/05/2018  Check In: Session Check In - 05/05/18 1008      Check-In   Supervising physician immediately available to respond to emergencies  [facility] immediately available    Physician(s)  Dr. Berle Mull    Location  [facility]    Staff Present  Rosebud Poles, RN, BSN;Carlette Wilber Oliphant, RN, BSN;Lisa Hughes, RN;Manhattan Mccuen, MS, ACSM RCEP, Exercise Physiologist;Dalton Kris Mouton, MS, Exercise Physiologist    Medication changes reported      No    Fall or balance concerns reported     No    Tobacco Cessation  No Change    Warm-up and Cool-down  Performed as group-led instruction    Resistance Training Performed  Yes    VAD Patient?  No    PAD/SET Patient?  No      Pain Assessment   Currently in Pain?  No/denies    Multiple Pain Sites  No       Capillary Blood Glucose: No results found for this or any previous visit (from the past 24 hour(s)).  Exercise Prescription Changes - 05/05/18 1200      Response to Exercise   Blood Pressure (Admit)  108/56    Blood Pressure (Exercise)  120/60    Blood Pressure (Exit)  98/56    Heart Rate (Admit)  84 bpm    Heart Rate (Exercise)  106 bpm    Heart Rate (Exit)  95 bpm    Oxygen Saturation (Admit)  97 %    Oxygen Saturation (Exercise)  100 %    Oxygen Saturation (Exit)  97 %    Rating of Perceived Exertion (Exercise)  13    Perceived Dyspnea (Exercise)  2    Duration  Progress to 45 minutes of aerobic exercise without signs/symptoms of physical distress    Intensity  THRR unchanged      Progression   Progression  Continue to progress workloads to maintain intensity without signs/symptoms of physical distress.      Resistance Training   Training Prescription  Yes    Weight  orange bands    Reps  10-15    Time  10 Minutes      Bike   Level  0.6    Minutes  17      NuStep   Level  3    SPM  80    Minutes  17    METs  2.5      Track   Laps  16    Minutes  17       Social History   Tobacco Use  Smoking Status Light Tobacco Smoker  . Packs/day: 0.10  . Types: Cigarettes  Smokeless Tobacco Never Used  Tobacco Comment   pt smokes 1 cigarrete occasionally. 1 pack "last a few months"    Goals Met:  Exercise tolerated well  Goals Unmet:  Not Applicable  Comments: Service time is from 10:30a to 12:00p    Dr. Rush Farmer is Medical Director for Pulmonary Rehab at [facility].

## 2018-05-07 ENCOUNTER — Encounter (HOSPITAL_COMMUNITY)
Admission: RE | Admit: 2018-05-07 | Discharge: 2018-05-07 | Disposition: A | Discharge status: Home / Self Care | Payer: No Typology Code available for payment source | Source: Ambulatory Visit | Attending: Pulmonary Disease | Admitting: Pulmonary Disease

## 2018-05-07 DIAGNOSIS — J449 Chronic obstructive pulmonary disease, unspecified: Secondary | ICD-10-CM

## 2018-05-07 NOTE — Progress Notes (Signed)
Daily Session Note  Patient Details  Name: Brett Saunders MRN: [id number] Date of Birth: 1946-02-01 Referring Provider:     Pulmonary Rehab Walk Test from 03/16/2018 in [place]  Referring Provider  Dr. Nelda Marseille      Encounter Date: 05/07/2018  Check In: Session Check In - 05/07/18 1030      Check-In   Supervising physician immediately available to respond to emergencies  [facility] immediately available    Physician(s)  Dr. Herbert Moors    Location  [facility]    Staff Present  Rosebud Poles, RN, Bjorn Loser, MS, Exercise Physiologist;Molly DiVincenzo, MS, ACSM RCEP, Exercise Physiologist;Lisa Ysidro Evert, RN    Medication changes reported      No    Fall or balance concerns reported     No    Tobacco Cessation  No Change    Warm-up and Cool-down  Performed as group-led instruction    Resistance Training Performed  Yes    VAD Patient?  No    PAD/SET Patient?  No      Pain Assessment   Currently in Pain?  No/denies    Multiple Pain Sites  No       Capillary Blood Glucose: No results found for this or any previous visit (from the past 24 hour(s)).    Social History   Tobacco Use  Smoking Status Light Tobacco Smoker  . Packs/day: 0.10  . Types: Cigarettes  Smokeless Tobacco Never Used  Tobacco Comment   pt smokes 1 cigarrete occasionally. 1 pack "last a few months"    Goals Met:  Proper associated with RPD/PD & O2 Sat Exercise tolerated well Strength training completed today  Goals Unmet:  Not Applicable  Comments: Service time is from 1030 to 1200    Dr. Rush Farmer is Medical Director for Pulmonary Rehab at [facility].

## 2018-05-07 NOTE — Progress Notes (Signed)
Daily Session Note  Patient Details  Name: Brett Saunders MRN: [id number] Date of Birth: 1945-09-28 Referring Provider:     Pulmonary Rehab Walk Test from 03/16/2018 in [place]  Referring Provider  Dr. Nelda Marseille      Encounter Date: 05/07/2018  Check In: Session Check In - 05/07/18 1030      Check-In   Supervising physician immediately available to respond to emergencies  [facility] immediately available    Physician(s)  Dr. Herbert Moors    Location  [facility]    Staff Present  Rosebud Poles, RN, Bjorn Loser, MS, Exercise Physiologist;Molly DiVincenzo, MS, ACSM RCEP, Exercise Physiologist;Lisa Ysidro Evert, RN    Medication changes reported      No    Fall or balance concerns reported     No    Tobacco Cessation  No Change    Warm-up and Cool-down  Performed as group-led instruction    Resistance Training Performed  Yes    VAD Patient?  No    PAD/SET Patient?  No      Pain Assessment   Currently in Pain?  No/denies    Multiple Pain Sites  No       Capillary Blood Glucose: No results found for this or any previous visit (from the past 24 hour(s)).    Social History   Tobacco Use  Smoking Status Light Tobacco Smoker  . Packs/day: 0.10  . Types: Cigarettes  Smokeless Tobacco Never Used  Tobacco Comment   pt smokes 1 cigarrete occasionally. 1 pack "last a few months"    Goals Met:  Exercise tolerated well  Goals Unmet:  Not Applicable  Comments: Service time is from 1030 to 1200    Dr. Rush Farmer is Medical Director for Pulmonary Rehab at [facility].

## 2018-05-12 ENCOUNTER — Encounter (HOSPITAL_COMMUNITY)
Admission: RE | Admit: 2018-05-12 | Discharge: 2018-05-12 | Disposition: A | Discharge status: Home / Self Care | Payer: No Typology Code available for payment source | Source: Ambulatory Visit | Attending: Pulmonary Disease | Admitting: Pulmonary Disease

## 2018-05-12 DIAGNOSIS — J449 Chronic obstructive pulmonary disease, unspecified: Secondary | ICD-10-CM | POA: Diagnosis not present

## 2018-05-12 NOTE — Progress Notes (Signed)
Daily Session Note  Patient Details  Name: Brett Saunders MRN: [id number] Date of Birth: May 08, 1945 Referring Provider:     Pulmonary Rehab Walk Test from 03/16/2018 in [place]  Referring Provider  Dr. Nelda Marseille      Encounter Date: 05/12/2018  Check In: Session Check In - 05/12/18 1203      Check-In   Supervising physician immediately available to respond to emergencies  [facility] immediately available    Physician(s)  Dr. Lonny Prude    Location  [facility]    Staff Present  Rosebud Poles, RN, Bjorn Loser, MS, Exercise Physiologist;Loula Marcella, MS, ACSM RCEP, Exercise Physiologist;Carlette Wilber Oliphant, RN, BSN    Medication changes reported      No    Fall or balance concerns reported     No    Tobacco Cessation  No Change    Warm-up and Cool-down  Performed as group-led instruction    Resistance Training Performed  Yes    VAD Patient?  No    PAD/SET Patient?  No      Pain Assessment   Currently in Pain?  No/denies    Multiple Pain Sites  No       Capillary Blood Glucose: No results found for this or any previous visit (from the past 24 hour(s)).    Social History   Tobacco Use  Smoking Status Light Tobacco Smoker  . Packs/day: 0.10  . Types: Cigarettes  Smokeless Tobacco Never Used  Tobacco Comment   pt smokes 1 cigarrete occasionally. 1 pack "last a few months"    Goals Met:  Personal goals reviewed  Goals Unmet:  Not Applicable  Comments: Service time is from 10:30a to 12:00p    Dr. Rush Farmer is Medical Director for Pulmonary Rehab at [facility].

## 2018-05-14 ENCOUNTER — Encounter (HOSPITAL_COMMUNITY)
Admission: RE | Admit: 2018-05-14 | Discharge: 2018-05-14 | Disposition: A | Discharge status: Home / Self Care | Payer: No Typology Code available for payment source | Source: Ambulatory Visit | Attending: Pulmonary Disease | Admitting: Pulmonary Disease

## 2018-05-14 DIAGNOSIS — J449 Chronic obstructive pulmonary disease, unspecified: Secondary | ICD-10-CM

## 2018-05-14 NOTE — Progress Notes (Signed)
Daily Session Note  Patient Details  Name: Axyl Sitzman MRN: [id number] Date of Birth: [birth year] Referring Provider:     Pulmonary Rehab Walk Test from 03/16/2018 in [employer]  Referring Provider  Dr. Nelda Marseille      Encounter Date: 05/14/2018  Check In: Session Check In - 05/14/18 1040      Check-In   Supervising physician immediately available to respond to emergencies  [facility] immediately available    Physician(s)  Dr. Maylene Roes    Location  [facility]    Staff Present  Hoy Register, MS, Exercise Physiologist;Redmond Whittley Ysidro Evert, RN;Molly DiVincenzo, MS, ACSM RCEP, Exercise Physiologist;Joan Leonia Reeves, RN, BSN    Medication changes reported      No    Fall or balance concerns reported     No    Tobacco Cessation  No Change    Warm-up and Cool-down  Performed as group-led Higher education careers adviser Performed  Yes    VAD Patient?  No    PAD/SET Patient?  No      Pain Assessment   Currently in Pain?  No/denies    Pain Score  0-No pain    Multiple Pain Sites  No       Capillary Blood Glucose: No results found for this or any previous visit (from the past 24 hour(s)).    Social History   Tobacco Use  Smoking Status Light Tobacco Smoker  . Packs/day: 0.10  . Types: Cigarettes  Smokeless Tobacco Never Used  Tobacco Comment   pt smokes 1 cigarrete occasionally. 1 pack "last a few months"    Goals Met:  Exercise tolerated well No report of cardiac concerns or symptoms Strength training completed today  Goals Unmet:  Not Applicable  Comments: Service time is from 1030 to 1200    Dr. Rush Farmer is Medical Director for Pulmonary Rehab at [facility].

## 2018-05-18 ENCOUNTER — Encounter (HOSPITAL_COMMUNITY): Payer: Self-pay

## 2018-05-18 NOTE — Progress Notes (Signed)
Pulmonary Individual Treatment Plan  Patient Details  Name: Brett Saunders MRN: [id number] Date of Birth: 11/05/1945 Referring Provider:     Pulmonary Rehab Walk Test from 03/16/2018 in [facility]  Referring Provider  Dr. Yacoub      Initial Encounter Date:    Pulmonary Rehab Walk Test from 03/16/2018 in [facility]  Date  03/16/18      Visit Diagnosis: Chronic obstructive pulmonary disease, unspecified COPD type (HCC)  Patient's Home Medications on Admission:   Current Outpatient Medications:  .  acetaminophen (TYLENOL) 500 MG tablet, Take 500 mg by mouth 2 (two) times daily., Disp: , Rfl:  .  amiodarone (PACERONE) 200 MG tablet, Take 100 mg by mouth daily. , Disp: , Rfl:  .  apixaban (ELIQUIS) 5 MG TABS tablet, Take 2.5 mg by mouth 2 (two) times daily., Disp: , Rfl:  .  azithromycin (ZITHROMAX Z-PAK) 250 MG tablet, 2 po day one, then 1 daily x 4 days (Patient not taking: Reported on 03/13/2018), Disp: 5 tablet, Rfl: 0 .  carvedilol (COREG) 3.125 MG tablet, Take 3.125 mg by mouth 2 (two) times daily with a meal., Disp: , Rfl:  .  diphenhydrAMINE (BENADRYL) 25 MG tablet, Take 25 mg by mouth at bedtime., Disp: , Rfl:  .  furosemide (LASIX) 20 MG tablet, Take 20 mg by mouth daily. , Disp: , Rfl:  .  Glycopyrrolate-Formoterol (BEVESPI AEROSPHERE) 9-4.8 MCG/ACT AERO, Inhale 2 puffs into the lungs 2 (two) times daily., Disp: , Rfl:  .  Liniments (SALONPAS PAIN RELIEF PATCH EX), Place 1 patch onto the skin daily as needed (pain)., Disp: , Rfl:  .  OLODATEROL HCL IN, Inhale 2.5 mg into the lungs every morning. 2 puffs, Disp: , Rfl:  .  oxyCODONE-acetaminophen (PERCOCET) 5-325 MG tablet, Take 1-2 tablets by mouth every 4 (four) hours as needed. (Patient not taking: Reported on 03/13/2018), Disp: 20 tablet, Rfl: 0 .  rOPINIRole (REQUIP) 0.25 MG tablet, Take 0.25 mg by mouth at bedtime. , Disp: , Rfl:  .  sodium chloride 1 g  tablet, Take 1 g by mouth 3 (three) times daily., Disp: , Rfl:   Past Medical History: Past Medical History:  Diagnosis Date  . Aortic anomaly    aortic calcification  . Arrhythmia    a fib  . Cancer (HCC)    lung  . Chronic obstructive lung disease (HCC)   . Hypertension   . Ischemic cardiomyopathy   . Peripheral vascular disease (HCC)   . Stroke (HCC)    oct 2012    Tobacco Use: Social History   Tobacco Use  Smoking Status Light Tobacco Smoker  . Packs/day: 0.10  . Types: Cigarettes  Smokeless Tobacco Never Used  Tobacco Comment   pt smokes 1 cigarrete occasionally. 1 pack "last a few months"    Labs: Recent Review Flowsheet Data    There is no flowsheet data to display.      Capillary Blood Glucose: No results found for: GLUCAP   Pulmonary Assessment Scores:   Pulmonary Function Assessment:   Exercise Target Goals: Exercise Program Goal: Individual exercise prescription set using results from initial 6 min walk test and THRR while considering  patient's activity barriers and safety.   Exercise Prescription Goal: Initial exercise prescription builds to 30-45 minutes a day of aerobic activity, 2-3 days per week.  Home exercise guidelines will be given to patient during program as part of exercise prescription that the   participant will acknowledge.  Activity Barriers & Risk Stratification: Activity Barriers & Cardiac Risk Stratification - 03/13/18 1203      Activity Barriers & Cardiac Risk Stratification   Activity Barriers  Back Problems;Balance Concerns;Shortness of Breath       6 Minute Walk: 6 Minute Walk    Row Name 03/17/18 0652         6 Minute Walk   Distance  1085 feet     Walk Time  6 minutes     # of Rest Breaks  0     MPH  2.05     METS  2.61     RPE  12     Perceived Dyspnea   3     Symptoms  No     Resting HR  64 bpm     Resting BP  114/64     Resting Oxygen Saturation   100 %     Exercise Oxygen Saturation  during 6 min  walk  94 %     Max Ex. HR  106 bpm     Max Ex. BP  122/62     2 Minute Post BP  116/64       Interval HR   1 Minute HR  97     2 Minute HR  106     3 Minute HR  104     4 Minute HR  105     5 Minute HR  105     6 Minute HR  104     2 Minute Post HR  88     Interval Heart Rate?  Yes       Interval Oxygen   Interval Oxygen?  Yes     Baseline Oxygen Saturation %  100 %     1 Minute Oxygen Saturation %  94 %     1 Minute Liters of Oxygen  0 L     2 Minute Oxygen Saturation %  94 %     2 Minute Liters of Oxygen  0 L     3 Minute Oxygen Saturation %  94 %     3 Minute Liters of Oxygen  0 L     4 Minute Oxygen Saturation %  96 %     4 Minute Liters of Oxygen  0 L     5 Minute Oxygen Saturation %  98 %     5 Minute Liters of Oxygen  0 L     6 Minute Oxygen Saturation %  98 %     6 Minute Liters of Oxygen  0 L     2 Minute Post Oxygen Saturation %  100 %     2 Minute Post Liters of Oxygen  0 L        Oxygen Initial Assessment: Oxygen Initial Assessment - 03/17/18 0651      Home Oxygen   Home Oxygen Device  Portable Concentrator;Home Concentrator    Sleep Oxygen Prescription  Continuous    Liters per minute  2    Home Exercise Oxygen Prescription  None    Home at Rest Exercise Oxygen Prescription  None    Compliance with Home Oxygen Use  Yes      Initial 6 min Walk   Oxygen Used  None      Program Oxygen Prescription   Program Oxygen Prescription  None      Intervention   Short Term Goals  To learn  and exhibit compliance with exercise, home and travel O2 prescription;To learn and understand importance of maintaining oxygen saturations>88%;To learn and demonstrate proper use of respiratory medications;To learn and understand importance of monitoring SPO2 with pulse oximeter and demonstrate accurate use of the pulse oximeter.;To learn and demonstrate proper pursed lip breathing techniques or other breathing techniques.    Long  Term Goals  Exhibits compliance with exercise,  home and travel O2 prescription;Verbalizes importance of monitoring SPO2 with pulse oximeter and return demonstration;Maintenance of O2 saturations>88%;Exhibits proper breathing techniques, such as pursed lip breathing or other method taught during program session;Compliance with respiratory medication;Demonstrates proper use of MDI's       Oxygen Re-Evaluation: Oxygen Re-Evaluation    Row Name 03/24/18 0725 04/21/18 0737 05/19/18 0709         Program Oxygen Prescription   Program Oxygen Prescription  None  None  None       Home Oxygen   Home Oxygen Device  Portable Concentrator;Home Concentrator  Portable Concentrator;Home Concentrator  Portable Concentrator;Home Concentrator     Sleep Oxygen Prescription  -  Continuous  Continuous     Liters per minute  _0 Home Exercise Oxygen Prescription  None  None  None     Home at Rest Exercise Oxygen Prescription  None  None  None     Compliance with Home Oxygen Use  Yes  Yes  Yes       Goals/Expected Outcomes   Short Term Goals  To learn and exhibit compliance with exercise, home and travel O2 prescription;To learn and understand importance of maintaining oxygen saturations>88%;To learn and demonstrate proper use of respiratory medications;To learn and understand importance of monitoring SPO2 with pulse oximeter and demonstrate accurate use of the pulse oximeter.;To learn and demonstrate proper pursed lip breathing techniques or other breathing techniques.  To learn and exhibit compliance with exercise, home and travel O2 prescription;To learn and understand importance of maintaining oxygen saturations>88%;To learn and demonstrate proper use of respiratory medications;To learn and understand importance of monitoring SPO2 with pulse oximeter and demonstrate accurate use of the pulse oximeter.;To learn and demonstrate proper pursed lip breathing techniques or other breathing techniques.  To learn and exhibit compliance with exercise, home and  travel O2 prescription;To learn and understand importance of maintaining oxygen saturations>88%;To learn and demonstrate proper use of respiratory medications;To learn and understand importance of monitoring SPO2 with pulse oximeter and demonstrate accurate use of the pulse oximeter.;To learn and demonstrate proper pursed lip breathing techniques or other breathing techniques.     Long  Term Goals  Exhibits compliance with exercise, home and travel O2 prescription;Verbalizes importance of monitoring SPO2 with pulse oximeter and return demonstration;Maintenance of O2 saturations>88%;Exhibits proper breathing techniques, such as pursed lip breathing or other method taught during program session;Compliance with respiratory medication;Demonstrates proper use of MDI's  Exhibits compliance with exercise, home and travel O2 prescription;Verbalizes importance of monitoring SPO2 with pulse oximeter and return demonstration;Maintenance of O2 saturations>88%;Exhibits proper breathing techniques, such as pursed lip breathing or other method taught during program session;Compliance with respiratory medication;Demonstrates proper use of MDI's  Exhibits compliance with exercise, home and travel O2 prescription;Verbalizes importance of monitoring SPO2 with pulse oximeter and return demonstration;Maintenance of O2 saturations>88%;Exhibits proper breathing techniques, such as pursed lip breathing or other method taught during program session;Compliance with respiratory medication;Demonstrates proper use of MDI's     Goals/Expected Outcomes  compliance   compliance   compliance  Oxygen Discharge (Final Oxygen Re-Evaluation): Oxygen Re-Evaluation - 05/19/18 0709      Program Oxygen Prescription   Program Oxygen Prescription  None      Home Oxygen   Home Oxygen Device  Portable Concentrator;Home Concentrator    Sleep Oxygen Prescription  Continuous    Liters per minute  2    Home Exercise Oxygen Prescription   None    Home at Rest Exercise Oxygen Prescription  None    Compliance with Home Oxygen Use  Yes      Goals/Expected Outcomes   Short Term Goals  To learn and exhibit compliance with exercise, home and travel O2 prescription;To learn and understand importance of maintaining oxygen saturations>88%;To learn and demonstrate proper use of respiratory medications;To learn and understand importance of monitoring SPO2 with pulse oximeter and demonstrate accurate use of the pulse oximeter.;To learn and demonstrate proper pursed lip breathing techniques or other breathing techniques.    Long  Term Goals  Exhibits compliance with exercise, home and travel O2 prescription;Verbalizes importance of monitoring SPO2 with pulse oximeter and return demonstration;Maintenance of O2 saturations>88%;Exhibits proper breathing techniques, such as pursed lip breathing or other method taught during program session;Compliance with respiratory medication;Demonstrates proper use of MDI's    Goals/Expected Outcomes  compliance        Initial Exercise Prescription: Initial Exercise Prescription - 03/17/18 0600      Date of Initial Exercise RX and Referring Provider   Date  03/16/18    Referring Provider  Dr. Yacoub      Bike   Level  0.4    Minutes  17      NuStep   Level  2    SPM  80    Minutes  17      Track   Laps  10    Minutes  17      Prescription Details   Frequency (times per week)  2    Duration  Progress to 45 minutes of aerobic exercise without signs/symptoms of physical distress      Intensity   THRR 40-80% of Max Heartrate  58-117    Ratings of Perceived Exertion  11-13    Perceived Dyspnea  0-4      Progression   Progression  Continue to progress workloads to maintain intensity without signs/symptoms of physical distress.      Resistance Training   Training Prescription  Yes    Weight  orange bands    Reps  10-15       Perform Capillary Blood Glucose checks as needed.  Exercise  Prescription Changes:  Exercise Prescription Changes    Row Name 03/24/18 1400 04/07/18 1200 04/07/18 1300 04/21/18 1200 05/05/18 1200     Response to Exercise   Blood Pressure (Admit)  106/62  110/72  -  118/68  108/56   Blood Pressure (Exercise)  118/60  100/64  -  110/60  120/60   Blood Pressure (Exit)  92/62  102/64  -  98/60  98/56   Heart Rate (Admit)  76 bpm  70 bpm  -  71 bpm  84 bpm   Heart Rate (Exercise)  110 bpm  106 bpm  -  108 bpm  106 bpm   Heart Rate (Exit)  85 bpm  90 bpm  -  68 bpm  95 bpm   Oxygen Saturation (Admit)  100 %  100 %  -  100 %  97 %   Oxygen Saturation (Exercise)  95 %    95 %  -  95 %  100 %   Oxygen Saturation (Exit)  95 %  90 %  -  99 %  97 %   Rating of Perceived Exertion (Exercise)  15  9  -  12  13   Perceived Dyspnea (Exercise)  2  1  -  2  2   Duration  Progress to 45 minutes of aerobic exercise without signs/symptoms of physical distress  Progress to 45 minutes of aerobic exercise without signs/symptoms of physical distress  -  Progress to 45 minutes of aerobic exercise without signs/symptoms of physical distress  Progress to 45 minutes of aerobic exercise without signs/symptoms of physical distress   Intensity  Other (comment) 40-80% of HRR  THRR unchanged  -  THRR unchanged  THRR unchanged     Progression   Progression  Continue to progress workloads to maintain intensity without signs/symptoms of physical distress.  Continue to progress workloads to maintain intensity without signs/symptoms of physical distress.  -  Continue to progress workloads to maintain intensity without signs/symptoms of physical distress.  Continue to progress workloads to maintain intensity without signs/symptoms of physical distress.     Resistance Training   Training Prescription  Yes  Yes  -  Yes  Yes   Weight  orange bands  orange bands  -  orange bands  orange bands   Reps  10-15  10-15  -  10-15  10-15   Time  10 Minutes  10 Minutes  -  10 Minutes  10 Minutes      Interval Training   Interval Training  -  No  -  -  -     Bike   Level  0.3  -  -  0.6  0.6   Minutes  17  -  -  17  17     NuStep   Level  2  2  -  3  3   SPM  80  80  -  80  80   Minutes  17  17  -  17  17   METs  2.1  1.6  -  2.3  2.5     Track   Laps  10  10  -  16  16   Minutes  17  17  -  17  17     Home Exercise Plan   Plans to continue exercise at  -  -  [employer] (comment) get a gym membership  -  -   Frequency  -  -  Add 2 additional days to program exercise sessions.  -  -   Initial Home Exercises Provided  -  -  04/07/18  -  -   [place] Name 05/19/18 1100 05/19/18 1200           Response to Exercise   Blood Pressure (Admit)  -  104/56      Blood Pressure (Exercise)  -  124/60      Blood Pressure (Exit)  -  100/64      Heart Rate (Admit)  -  84 bpm      Heart Rate (Exercise)  -  104 bpm      Heart Rate (Exit)  -  60 bpm      Oxygen Saturation (Admit)  -  98 %      Oxygen Saturation (Exercise)  -  90 %      Oxygen Saturation (Exit)  -  97 %      Rating of Perceived Exertion (Exercise)  -  11      Perceived Dyspnea (Exercise)  -  1      Duration  -  Progress to 45 minutes of aerobic exercise without signs/symptoms of physical distress      Intensity  -  THRR unchanged        Resistance Training   Training Prescription  -  Yes      Weight  -  orange bands      Reps  -  10-15      Time  -  10 Minutes        Bike   Level  -  0.6      Minutes  -  17        NuStep   Level  4  4      SPM  80  80      Minutes  34  17      METs  1.6  2.1        Track   Laps  -  14      Minutes  -  17         Exercise Comments:  Exercise Comments    Row Name 04/07/18 1317           Exercise Comments  home exercise complete          Exercise Goals and Review:  Exercise Goals    [place] Name 03/13/18 1208             Exercise Goals   Increase Physical Activity  Yes       Intervention  Provide advice, education, support and counseling about physical  activity/exercise needs.;Develop an individualized exercise prescription for aerobic and resistive training based on initial evaluation findings, risk stratification, comorbidities and participant's personal goals.       Expected Outcomes  Short Term: Attend rehab on a regular basis to increase amount of physical activity.;Long Term: Add in home exercise to make exercise part of routine and to increase amount of physical activity.;Long Term: Exercising regularly at least 3-5 days a week.       Increase Strength and Stamina  Yes       Intervention  Provide advice, education, support and counseling about physical activity/exercise needs.;Develop an individualized exercise prescription for aerobic and resistive training based on initial evaluation findings, risk stratification, comorbidities and participant's personal goals.       Expected Outcomes  Short Term: Increase workloads from initial exercise prescription for resistance, speed, and METs.;Short Term: Perform resistance training exercises routinely during rehab and add in resistance training at home;Long Term: Improve cardiorespiratory fitness, muscular endurance and strength as measured by increased METs and functional capacity (6MWT)       Able to understand and use rate of perceived exertion (RPE) scale  Yes       Intervention  Provide education and explanation on how to use RPE scale       Expected Outcomes  Short Term: Able to use RPE daily in rehab to express subjective intensity level;Long Term:  Able to use RPE to guide intensity level when exercising independently       Able to understand and use Dyspnea scale  Yes       Intervention  Provide education and explanation on how to use Dyspnea scale       Expected Outcomes  Short Term: Able to use Dyspnea  scale daily in rehab to express subjective sense of shortness of breath during exertion;Long Term: Able to use Dyspnea scale to guide intensity level when exercising independently        Knowledge and understanding of Target Heart Rate Range (THRR)  Yes       Intervention  Provide education and explanation of THRR including how the numbers were predicted and where they are located for reference       Expected Outcomes  Short Term: Able to state/look up THRR;Long Term: Able to use THRR to govern intensity when exercising independently;Short Term: Able to use daily as guideline for intensity in rehab       Understanding of Exercise Prescription  Yes       Intervention  Provide education, explanation, and written materials on patient's individual exercise prescription       Expected Outcomes  Short Term: Able to explain program exercise prescription;Long Term: Able to explain home exercise prescription to exercise independently       Improve claudication pain toleration; Improve walking ability  Yes       Intervention  Participate in PAD/SET Rehab 2-3 days a week, walking at home as part of exercise prescription;Attend education sessions to aid in risk factor modification and understanding of disease process       Expected Outcomes  Short Term: Improve walking distance/time to onset of claudication pain;Long Term: Improve walking ability and toleration to claudication;Long Term: Improve score of PAD questionnaires          Exercise Goals Re-Evaluation : Exercise Goals Re-Evaluation    Row Name 03/24/18 0726 04/21/18 0737 05/19/18 0710         Exercise Goal Re-Evaluation   Exercise Goals Review  Increase Physical Activity;Increase Strength and Stamina;Able to understand and use rate of perceived exertion (RPE) scale;Able to understand and use Dyspnea scale;Knowledge and understanding of Target Heart Rate Range (THRR);Understanding of Exercise Prescription  Increase Physical Activity;Increase Strength and Stamina;Able to understand and use rate of perceived exertion (RPE) scale;Able to understand and use Dyspnea scale;Knowledge and understanding of Target Heart Rate Range  (THRR);Understanding of Exercise Prescription  Increase Physical Activity;Increase Strength and Stamina;Able to understand and use rate of perceived exertion (RPE) scale;Able to understand and use Dyspnea scale;Knowledge and understanding of Target Heart Rate Range (THRR);Understanding of Exercise Prescription     Comments  Pt will begin his first exercise session today. Will monitor and progress as able through the program.  Pt is highly motivated to exercise. After I consulted with pt about home exercise, he went and got a membership at [employer]. Pt is exercising at 2.4 METs and walking 14 laps (1 lap=200 ft). I expect the pt will progress well. Will continue to monitor and progress as able.   Pt is highly motivated to exercise and is especially interested in the education sessions. Pt is limited by leg claudication from PAD, but he is now able to walk 14 laps around the track in 15 minutes (1 lap=200 ft). Pt has increased his METs on the stepper to ~2.5. Will continue to monitor and progress as able.      Expected Outcomes  Through exercise at rehab and at home, the patient will decrease shortness of breath with daily activities and feel confident in carrying out an exercise regime at home.   Through exercise at rehab and at home, the patient will decrease shortness of breath with daily activities and feel confident in carrying out an exercise regime at home.     Through exercise at rehab and at home, the patient will decrease shortness of breath with daily activities and feel confident in carrying out an exercise regime at home.         Discharge Exercise Prescription (Final Exercise Prescription Changes): Exercise Prescription Changes - 05/19/18 1200      Response to Exercise   Blood Pressure (Admit)  104/56    Blood Pressure (Exercise)  124/60    Blood Pressure (Exit)  100/64    Heart Rate (Admit)  84 bpm    Heart Rate (Exercise)  104 bpm    Heart Rate (Exit)  60 bpm    Oxygen Saturation  (Admit)  98 %    Oxygen Saturation (Exercise)  90 %    Oxygen Saturation (Exit)  97 %    Rating of Perceived Exertion (Exercise)  11    Perceived Dyspnea (Exercise)  1    Duration  Progress to 45 minutes of aerobic exercise without signs/symptoms of physical distress    Intensity  THRR unchanged      Resistance Training   Training Prescription  Yes    Weight  orange bands    Reps  10-15    Time  10 Minutes      Bike   Level  0.6    Minutes  17      NuStep   Level  4    SPM  80    Minutes  17    METs  2.1      Track   Laps  14    Minutes  17       Nutrition:  Target Goals: Understanding of nutrition guidelines, daily intake of sodium <1561m, cholesterol <2012m calories 30% from fat and 7% or less from saturated fats, daily to have 5 or more servings of fruits and vegetables.  Biometrics: Pre Biometrics - 03/13/18 1204      Pre Biometrics   Grip Strength  30 kg        Nutrition Therapy Plan and Nutrition Goals: Nutrition Therapy & Goals - 04/30/18 1216      Nutrition Therapy   Diet  high protein, high calorie      Personal Nutrition Goals   Nutrition Goal  The pt will recognize symptoms that can interfere with adequate oral intake, such as shortness of breath, N/V, early satiety, fatigue, ability to secure and prepare food, taste and smell changes, chewing/swallowing difficulties, and/ or pain when eating.    Personal Goal #2  The pt will consume high-energy, high-nutrient dense beverages when necessary to compensate for decreased oral intake of solid foods.    Personal Goal #3  The pt will have family and friends shop for food when necessary so that nourishing foods are always available at home.    Personal Goal #4  Identify food quantities necessary to achieve wt gain of  -2# per week to a goal wt gain of 5-24 lb at graduation from pulmonary rehab.      Intervention Plan   Intervention  Prescribe, educate and counsel regarding individualized specific dietary  modifications aiming towards targeted core components such as weight, hypertension, lipid management, diabetes, heart failure and other comorbidities.    Expected Outcomes  Short Term Goal: Understand basic principles of dietary content, such as calories, fat, sodium, cholesterol and nutrients.;Long Term Goal: Adherence to prescribed nutrition plan.       Nutrition Assessments:   Nutrition Goals Re-Evaluation:   Nutrition Goals Discharge (Final Nutrition Goals Re-Evaluation):  Psychosocial: Target Goals: Acknowledge presence or absence of significant depression and/or stress, maximize coping skills, provide positive support system. Participant is able to verbalize types and ability to use techniques and skills needed for reducing stress and depression.  Initial Review & Psychosocial Screening: Initial Psych Review & Screening - 03/13/18 1212      Initial Review   Current issues with  None Identified      Family Dynamics   Good Support System?  Yes      Barriers   Psychosocial barriers to participate in program  There are no identifiable barriers or psychosocial needs.;The patient should benefit from training in stress management and relaxation.      Screening Interventions   Interventions  Encouraged to exercise;To provide support and resources with identified psychosocial needs    Expected Outcomes  Long Term Goal: Stressors or current issues are controlled or eliminated.;Short Term goal: Identification and review with participant of any Quality of Life or Depression concerns found by scoring the questionnaire.;Long Term goal: The participant improves quality of Life and PHQ9 Scores as seen by post scores and/or verbalization of changes       Quality of Life Scores:  Scores of 19 and below usually indicate a poorer quality of life in these areas.  A difference of  2-3 points is a clinically meaningful difference.  A difference of 2-3 points in the total score of the Quality of  Life Index has been associated with significant improvement in overall quality of life, self-image, physical symptoms, and general health in studies assessing change in quality of life.  PHQ-9: Recent Review Flowsheet Data    Depression screen PHQ 2/9 03/13/2018 03/13/2018   Decreased Interest 0 0   Down, Depressed, Hopeless 0 0   PHQ - 2 Score 0 0   Altered sleeping 0 0   Tired, decreased energy 3 3   Change in appetite 3 3   Feeling bad or failure about yourself  0 0   Trouble concentrating 0 0   Moving slowly or fidgety/restless 0 0   Suicidal thoughts 0 0   PHQ-9 Score 6 6   Difficult doing work/chores Somewhat difficult Somewhat difficult     Interpretation of Total Score  Total Score Depression Severity:  1-4 = Minimal depression, 5-9 = Mild depression, 10-14 = Moderate depression, 15-19 = Moderately severe depression, 20-27 = Severe depression   Psychosocial Evaluation and Intervention: Psychosocial Evaluation - 05/18/18 1631      Psychosocial Evaluation & Interventions   Interventions  Stress management education;Relaxation education;Encouraged to exercise with the program and follow exercise prescription    Comments  Pt with recent move from [place] to [place]. good support from wife and grandaughter.    Expected Outcomes  Pt will continue to display positve and healthy coping skills by his family    Continue Psychosocial Services   Follow up required by staff       Psychosocial Re-Evaluation: Psychosocial Re-Evaluation    Row Name 03/25/18 1645 04/21/18 0956 05/18/18 1631         Psychosocial Re-Evaluation   Current issues with  Current Stress Concerns  Current Stress Concerns  Current Stress Concerns     Comments  Pt has recently moved from [place] to [place]. Lives with wife who is a heavy smoker. Pt has moved to be closer to grandaughter, who he states does not come around very often. Pt states that he has a "shop" that he goes out to when his wife   smoking bothers him or when he  needs to get away.  Pt has recently moved from [place] to [place]. Pt is getting acclimated and is settling in to life in [place]. Lives with wife who is a heavy smoker. Pt has moved to be closer to grandaughter, who he states does not come around very often. Pt states that he has a "shop" that he goes out to when his wife smoking bothers him or when he needs to get away.  Pt has recently moved from [place] to [place]. Pt is settling in to life in [place]. Lives with wife who is a heavy smoker. Pt has moved to be closer to grandaughter, who he states does not come around very often. Pt states that he has a "shop" that he goes out to when his wife smoking bothers him or when he needs to get away.     Expected Outcomes  pt will become more comfortable with [place] and will maintain coping skills to deal with current stressors.  pt will become more comfortable with [place] and will maintain coping skills to deal with current stressors.  pt will continue to settle in with the move [place] and will maintain coping skills to deal with current stressors.     Interventions  Relaxation education;Stress management education;Encouraged to attend Pulmonary Rehabilitation for the exercise  Relaxation education;Stress management education;Encouraged to attend Pulmonary Rehabilitation for the exercise  Relaxation education;Stress management education;Encouraged to attend Pulmonary Rehabilitation for the exercise     Continue Psychosocial Services   Follow up required by staff  -  Follow up required by staff     Comments  pt has moved to [facility] for better air quality to deal with illness  -  -       Initial Review   Source of Stress Concerns  Chronic Illness  -  -        Psychosocial Discharge (Final Psychosocial Re-Evaluation): Psychosocial Re-Evaluation - 05/18/18 1631      Psychosocial Re-Evaluation   Current issues with  Current Stress Concerns    Comments  Pt has recently moved from [place] to [place]. Pt is settling in to life in [place]. Lives with wife who is a heavy  smoker. Pt has moved to be closer to grandaughter, who he states does not come around very often. Pt states that he has a "shop" that he goes out to when his wife smoking bothers him or when he needs to get away.    Expected Outcomes  pt will continue to settle in with the move [place] and will maintain coping skills to deal with current stressors.    Interventions  Relaxation education;Stress management education;Encouraged to attend Pulmonary Rehabilitation for the exercise    Continue Psychosocial Services   Follow up required by staff       Education: Education Goals: Education classes will be provided on a weekly basis, covering required topics. Participant will state understanding/return demonstration of topics presented.  Learning Barriers/Preferences: Learning Barriers/Preferences - 03/13/18 1218      Learning Barriers/Preferences   Learning Barriers  Sight;Hearing    Learning Preferences  Written Material;Computer/Internet;Group Instruction       Education Topics: Risk Factor Reduction:  -Group instruction that is supported by a PowerPoint presentation. Instructor discusses the definition of a risk factor, different risk factors for pulmonary disease, and how the heart and lungs work together.     Nutrition for Pulmonary Patient:  -Group instruction provided by PowerPoint slides, verbal discussion, and written  materials to support subject matter. The instructor gives an explanation and review of healthy diet recommendations, which includes a discussion on weight management, recommendations for fruit and vegetable consumption, as well as protein, fluid, caffeine, fiber, sodium, sugar, and alcohol. Tips for eating when patients are short of breath are discussed.   PULMONARY REHAB CHRONIC OBSTRUCTIVE PULMONARY DISEASE from 05/14/2018 in [facility]  Date  03/26/18  Educator  aubrey  Instruction Review Code  1- Verbalizes Understanding      Pursed Lip  Breathing:  -Group instruction that is supported by demonstration and informational handouts. Instructor discusses the benefits of pursed lip and diaphragmatic breathing and detailed demonstration on how to preform both.     Oxygen Safety:  -Group instruction provided by PowerPoint, verbal discussion, and written material to support subject matter. There is an overview of "What is Oxygen" and "Why do we need it".  Instructor also reviews how to create a safe environment for oxygen use, the importance of using oxygen as prescribed, and the risks of noncompliance. There is a brief discussion on traveling with oxygen and resources the patient may utilize.   PULMONARY REHAB CHRONIC OBSTRUCTIVE PULMONARY DISEASE from 05/14/2018 in [facility]  Date  04/23/18  Educator  Molly  Instruction Review Code  1- Verbalizes Understanding      Oxygen Equipment:  -Group instruction provided by Home Health Staff utilizing handouts, written materials, and equipment demonstrations.   Signs and Symptoms:  -Group instruction provided by written material and verbal discussion to support subject matter. Warning signs and symptoms of infection, stroke, and heart attack are reviewed and when to call the physician/911 reinforced. Tips for preventing the spread of infection discussed.   PULMONARY REHAB CHRONIC OBSTRUCTIVE PULMONARY DISEASE from 05/14/2018 in [facility]  Date  05/14/18  Educator  Joan  Instruction Review Code  1- Verbalizes Understanding      Advanced Directives:  -Group instruction provided by verbal instruction and written material to support subject matter. Instructor reviews Advanced Directive laws and proper instruction for filling out document.   Pulmonary Video:  -Group video education that reviews the importance of medication and oxygen compliance, exercise, good nutrition, pulmonary hygiene, and pursed lip and diaphragmatic  breathing for the pulmonary patient.   Exercise for the Pulmonary Patient:  -Group instruction that is supported by a PowerPoint presentation. Instructor discusses benefits of exercise, core components of exercise, frequency, duration, and intensity of an exercise routine, importance of utilizing pulse oximetry during exercise, safety while exercising, and options of places to exercise outside of rehab.     PULMONARY REHAB CHRONIC OBSTRUCTIVE PULMONARY DISEASE from 05/14/2018 in [facility]  Date  05/07/18  Educator  Dalton  Instruction Review Code  1- Verbalizes Understanding      Pulmonary Medications:  -Verbally interactive group education provided by instructor with focus on inhaled medications and proper administration.   PULMONARY REHAB CHRONIC OBSTRUCTIVE PULMONARY DISEASE from 05/14/2018 in [facility]  Date  04/07/18  Educator  pharmacy  Instruction Review Code  1- Verbalizes Understanding      Anatomy and Physiology of the Respiratory System and Intimacy:  -Group instruction provided by PowerPoint, verbal discussion, and written material to support subject matter. Instructor reviews respiratory cycle and anatomical components of the respiratory system and their functions. Instructor also reviews differences in obstructive and restrictive respiratory diseases with examples of each. Intimacy, Sex,   and Sexuality differences are reviewed with a discussion on how relationships can change when diagnosed with pulmonary disease. Common sexual concerns are reviewed.   PULMONARY REHAB CHRONIC OBSTRUCTIVE PULMONARY DISEASE from 05/14/2018 in [facility]  Date  04/16/18  Educator  rn  Instruction Review Code  1- Verbalizes Understanding      MD DAY -A group question and answer session with a medical doctor that allows participants to ask questions that relate to their pulmonary disease  state.   OTHER EDUCATION -Group or individual verbal, written, or video instructions that support the educational goals of the pulmonary rehab program.   PULMONARY REHAB CHRONIC OBSTRUCTIVE PULMONARY DISEASE from 05/14/2018 in [facility]  Date  04/30/18  Educator  George [[employer]]  Instruction Review Code  1- Verbalizes Understanding      Holiday Eating Survival Tips:  -Group instruction provided by PowerPoint slides, verbal discussion, and written materials to support subject matter. The instructor gives patients tips, tricks, and techniques to help them not only survive but enjoy the holidays despite the onslaught of food that accompanies the holidays.   Knowledge Questionnaire Score:   Core Components/Risk Factors/Patient Goals at Admission: Personal Goals and Risk Factors at Admission - 03/13/18 1219      Core Components/Risk Factors/Patient Goals on Admission    Weight Management  Weight Gain    Tobacco Cessation  Yes    Number of packs per day  vaping and occasional cigarette use    Intervention  Assist the participant in steps to quit. Provide individualized education and counseling about committing to Tobacco Cessation, relapse prevention, and pharmacological support that can be provided by physician.;Offer self-teaching materials, assist with locating and accessing local/national Quit Smoking programs, and support quit date choice.    Expected Outcomes  Short Term: Will demonstrate readiness to quit, by selecting a quit date.;Short Term: Will quit all tobacco product use, adhering to prevention of relapse plan.    Improve shortness of breath with ADL's  Yes    Intervention  Provide education, individualized exercise plan and daily activity instruction to help decrease symptoms of SOB with activities of daily living.    Expected Outcomes  Short Term: Improve cardiorespiratory fitness to achieve a reduction of symptoms when performing ADLs;Long  Term: Be able to perform more ADLs without symptoms or delay the onset of symptoms    Stress  Yes    Intervention  Offer individual and/or small group education and counseling on adjustment to heart disease, stress management and health-related lifestyle change. Teach and support self-help strategies.;Refer participants experiencing significant psychosocial distress to appropriate mental health specialists for further evaluation and treatment. When possible, include family members and significant others in education/counseling sessions.    Expected Outcomes  Short Term: Participant demonstrates changes in health-related behavior, relaxation and other stress management skills, ability to obtain effective social support, and compliance with psychotropic medications if prescribed.;Long Term: Emotional wellbeing is indicated by absence of clinically significant psychosocial distress or social isolation.       Core Components/Risk Factors/Patient Goals Review:  Goals and Risk Factor Review    Row Name 03/13/18 1222 03/25/18 1653 04/21/18 0957 05/18/18 1633       Core Components/Risk Factors/Patient Goals Review   Personal Goals Review  Weight Management/Obesity;Tobacco Cessation;Improve shortness of breath with ADL's;Develop more efficient breathing techniques such as purse lipped breathing and diaphragmatic breathing and practicing self-pacing with activity.;Increase knowledge of respiratory medications and ability to use respiratory   devices properly.;Stress  Weight Management/Obesity;Tobacco Cessation;Improve shortness of breath with ADL's;Develop more efficient breathing techniques such as purse lipped breathing and diaphragmatic breathing and practicing self-pacing with activity.;Increase knowledge of respiratory medications and ability to use respiratory devices properly.;Stress  Weight Management/Obesity;Tobacco Cessation;Improve shortness of breath with ADL's;Develop more efficient breathing  techniques such as purse lipped breathing and diaphragmatic breathing and practicing self-pacing with activity.;Increase knowledge of respiratory medications and ability to use respiratory devices properly.;Stress  Weight Management/Obesity;Tobacco Cessation;Improve shortness of breath with ADL's;Develop more efficient breathing techniques such as purse lipped breathing and diaphragmatic breathing and practicing self-pacing with activity.;Increase knowledge of respiratory medications and ability to use respiratory devices properly.;Stress    Review  -  Pt has completes 1 day of exercise and no education. Pt completed 10 laps around the track, a level 2 on the nustep, and a level of 0.3 on the airdyne. Pt reports no changes in smoking habits, but will hopefully decrease use. pt will learn PLB techniques and implement then with the assistance of staff. Anticipate will moke progress for the next 30 day review.   Pt has completes 5 day of exercise and 3 education classes. Pt workloads of 11-14 laps around the track, a increase to level 3 on the nustep, and an increase to level 0.4 on the airdyne. Pt reports no changes in smoking habits, but will hopefully decrease use. pt will learn PLB techniques and implement then with the assistance of staff. Anticipate will moke progress for the next 30 day review.   Pt has completes 13 day of exercise and 7 education classes and had settled into rehab nicely. Pt workloads of 12-14 laps around the track, a increase to level 4 on the nustep, and an increase to level 0.6 on the airdyne. Pt reports no changes in smoking habits, but will hopefully decrease use. Pt wife maintains as a heavy smoker. pt will learn PLB techniques and implement then with the assistance of staff. Will continue to monitor for progress for the next 30 day review.     Expected Outcomes  -  see admission goals and outcomes  see admission goals and outcomes  see admission goals and outcomes       Core  Components/Risk Factors/Patient Goals at Discharge (Final Review):  Goals and Risk Factor Review - 05/18/18 1633      Core Components/Risk Factors/Patient Goals Review   Personal Goals Review  Weight Management/Obesity;Tobacco Cessation;Improve shortness of breath with ADL's;Develop more efficient breathing techniques such as purse lipped breathing and diaphragmatic breathing and practicing self-pacing with activity.;Increase knowledge of respiratory medications and ability to use respiratory devices properly.;Stress    Review  Pt has completes 13 day of exercise and 7 education classes and had settled into rehab nicely. Pt workloads of 12-14 laps around the track, a increase to level 4 on the nustep, and an increase to level 0.6 on the airdyne. Pt reports no changes in smoking habits, but will hopefully decrease use. Pt wife maintains as a heavy smoker. pt will learn PLB techniques and implement then with the assistance of staff. Will continue to monitor for progress for the next 30 day review.     Expected Outcomes  see admission goals and outcomes       ITP Comments: ITP Comments    Row Name 03/13/18 1126 03/25/18 1643 04/21/18 0955 05/18/18 1630     ITP Comments  Dr. Wessam Yacoub medical director for pulmonary rehab.  Dr. Wessam Yacoub medical director for pulmonary rehab.    Dr. Wessam Yacoub medical director for pulmonary rehab.  Dr. Wessam Yacoub medical director for pulmonary rehab.       Comments: ITP REVIEW Pt is making expected progress toward personal goals after completing 13 sessions. Recommend continued exercise, life style modification, education, and utilization of breathing techniques to increase stamina and strength and decrease shortness of breath with exertion.    RN, BSN Cardiac and Pulmonary Rehab RN    

## 2018-05-19 ENCOUNTER — Encounter (HOSPITAL_COMMUNITY)
Admission: RE | Admit: 2018-05-19 | Discharge: 2018-05-19 | Disposition: A | Discharge status: Home / Self Care | Payer: No Typology Code available for payment source | Source: Ambulatory Visit | Attending: Pulmonary Disease | Admitting: Pulmonary Disease

## 2018-05-19 VITALS — Wt 120.2 lb

## 2018-05-19 DIAGNOSIS — I255 Ischemic cardiomyopathy: Secondary | ICD-10-CM | POA: Diagnosis not present

## 2018-05-19 DIAGNOSIS — J449 Chronic obstructive pulmonary disease, unspecified: Secondary | ICD-10-CM | POA: Diagnosis present

## 2018-05-19 DIAGNOSIS — Z79899 Other long term (current) drug therapy: Secondary | ICD-10-CM | POA: Diagnosis not present

## 2018-05-19 DIAGNOSIS — I4891 Unspecified atrial fibrillation: Secondary | ICD-10-CM | POA: Insufficient documentation

## 2018-05-19 DIAGNOSIS — F17210 Nicotine dependence, cigarettes, uncomplicated: Secondary | ICD-10-CM | POA: Insufficient documentation

## 2018-05-19 DIAGNOSIS — I10 Essential (primary) hypertension: Secondary | ICD-10-CM | POA: Diagnosis not present

## 2018-05-19 DIAGNOSIS — I700 Atherosclerosis of aorta: Secondary | ICD-10-CM | POA: Diagnosis not present

## 2018-05-19 DIAGNOSIS — Z7901 Long term (current) use of anticoagulants: Secondary | ICD-10-CM | POA: Insufficient documentation

## 2018-05-19 NOTE — Progress Notes (Signed)
Daily Session Note  Patient Details  Name: Ronen Bromwell MRN: [id number] Date of Birth: April 26, 1945 Referring Provider:     Pulmonary Rehab Walk Test from 03/16/2018 in [place]  Referring Provider  Dr. Nelda Marseille      Encounter Date: 05/19/2018  Check In: Session Check In - 05/19/18 1028      Check-In   Supervising physician immediately available to respond to emergencies  [facility] immediately available    Physician(s)  Dr. Maylene Roes    Location  [facility]    Staff Present  Hoy Register, MS, Exercise Physiologist;Lisa Ysidro Evert, RN;Raychell Holcomb, MS, ACSM RCEP, Exercise Physiologist;Joan Leonia Reeves, RN, BSN;Lindsay Leisure centre manager, BSN    Medication changes reported      No    Fall or balance concerns reported     No    Tobacco Cessation  No Change    Warm-up and Cool-down  Performed as group-led Higher education careers adviser Performed  Yes    VAD Patient?  No    PAD/SET Patient?  No      Pain Assessment   Currently in Pain?  No/denies    Multiple Pain Sites  No       Capillary Blood Glucose: No results found for this or any previous visit (from the past 24 hour(s)).  Exercise Prescription Changes - 05/19/18 1200      Response to Exercise   Blood Pressure (Admit)  104/56    Blood Pressure (Exercise)  124/60    Blood Pressure (Exit)  100/64    Heart Rate (Admit)  84 bpm    Heart Rate (Exercise)  104 bpm    Heart Rate (Exit)  60 bpm    Oxygen Saturation (Admit)  98 %    Oxygen Saturation (Exercise)  90 %    Oxygen Saturation (Exit)  97 %    Rating of Perceived Exertion (Exercise)  11    Perceived Dyspnea (Exercise)  1    Duration  Progress to 45 minutes of aerobic exercise without signs/symptoms of physical distress    Intensity  THRR unchanged      Resistance Training   Training Prescription  Yes    Weight  orange bands    Reps  10-15    Time  10 Minutes      Bike   Level  0.6    Minutes  17      NuStep    Level  4    SPM  80    Minutes  17    METs  2.1      Track   Laps  14    Minutes  17       Social History   Tobacco Use  Smoking Status Light Tobacco Smoker  . Packs/day: 0.10  . Types: Cigarettes  Smokeless Tobacco Never Used  Tobacco Comment   pt smokes 1 cigarrete occasionally. 1 pack "last a few months"    Goals Met:  Exercise tolerated well  Goals Unmet:  Not Applicable  Comments: Service time is from 10:30a to 12:00p    Dr. Rush Farmer is Medical Director for Pulmonary Rehab at [facility].

## 2018-05-21 ENCOUNTER — Encounter (HOSPITAL_COMMUNITY)
Admission: RE | Admit: 2018-05-21 | Discharge: 2018-05-21 | Disposition: A | Discharge status: Home / Self Care | Payer: No Typology Code available for payment source | Source: Ambulatory Visit | Attending: Pulmonary Disease | Admitting: Pulmonary Disease

## 2018-05-21 DIAGNOSIS — J449 Chronic obstructive pulmonary disease, unspecified: Secondary | ICD-10-CM | POA: Diagnosis not present

## 2018-05-21 NOTE — Progress Notes (Signed)
Daily Session Note  Patient Details  Name: Brett Saunders MRN: [id number] Date of Birth: 1945/06/11 Referring Provider:     Pulmonary Rehab Walk Test from 03/16/2018 in [place]  Referring Provider  Dr. Nelda Marseille      Encounter Date: 05/21/2018  Check In: Session Check In - 05/21/18 1030      Check-In   Supervising physician immediately available to respond to emergencies  [facility] immediately available    Physician(s)  Dr. Wynelle Cleveland    Location  [facility]    Staff Present  Joycelyn Man RN, BSN;Molly DiVincenzo, MS, ACSM RCEP, Exercise Physiologist;Dalton Kris Mouton, MS, Exercise Physiologist;Joan Leonia Reeves, RN, Roque Cash, RN    Medication changes reported      No    Fall or balance concerns reported     No    Tobacco Cessation  No Change    Warm-up and Cool-down  Performed as group-led instruction    Resistance Training Performed  Yes    VAD Patient?  No    PAD/SET Patient?  No      Pain Assessment   Currently in Pain?  No/denies    Multiple Pain Sites  No       Capillary Blood Glucose: No results found for this or any previous visit (from the past 24 hour(s)).    Social History   Tobacco Use  Smoking Status Light Tobacco Smoker  . Packs/day: 0.10  . Types: Cigarettes  Smokeless Tobacco Never Used  Tobacco Comment   pt smokes 1 cigarrete occasionally. 1 pack "last a few months"    Goals Met:  Proper associated with RPD/PD & O2 Sat Exercise tolerated well Strength training completed today  Goals Unmet:  Not Applicable  Comments: Service time is from 1030 to 1200   Dr. Rush Farmer is Medical Director for Pulmonary Rehab at [facility].

## 2018-05-26 ENCOUNTER — Encounter (HOSPITAL_COMMUNITY): Payer: No Typology Code available for payment source

## 2018-05-28 ENCOUNTER — Encounter (HOSPITAL_COMMUNITY)
Admission: RE | Admit: 2018-05-28 | Discharge: 2018-05-28 | Disposition: A | Discharge status: Home / Self Care | Payer: No Typology Code available for payment source | Source: Ambulatory Visit | Attending: Pulmonary Disease | Admitting: Pulmonary Disease

## 2018-05-28 ENCOUNTER — Other Ambulatory Visit: Payer: Self-pay

## 2018-05-28 VITALS — Wt 120.4 lb

## 2018-05-28 DIAGNOSIS — J449 Chronic obstructive pulmonary disease, unspecified: Secondary | ICD-10-CM | POA: Diagnosis not present

## 2018-05-28 NOTE — Progress Notes (Signed)
Daily Session Note  Patient Details  Name: Brett Saunders MRN: [id number] Date of Birth: 01/06/1946 Referring Provider:     Pulmonary Rehab Walk Test from 03/16/2018 in [facility]  Referring Provider  Dr. Yacoub      Encounter Date: 05/28/2018  Check In: Session Check In - 05/28/18 1024      Check-In   Staff Present  Lindsay Agnew RN, BSN;Dalton Fletcher, MS, Exercise Physiologist;Molly DiVincenzo, MS, ACSM RCEP, Exercise Physiologist; , RN    Medication changes reported      No    Fall or balance concerns reported     No    Tobacco Cessation  No Change    Warm-up and Cool-down  Performed as group-led instruction    Resistance Training Performed  Yes    VAD Patient?  No    PAD/SET Patient?  No      Pain Assessment   Currently in Pain?  No/denies    Pain Score  0-No pain    Multiple Pain Sites  No       Capillary Blood Glucose: No results found for this or any previous visit (from the past 24 hour(s)).    Social History   Tobacco Use  Smoking Status Light Tobacco Smoker  . Packs/day: 0.10  . Types: Cigarettes  Smokeless Tobacco Never Used  Tobacco Comment   pt smokes 1 cigarrete occasionally. 1 pack "last a few months"    Goals Met:  Exercise tolerated well No report of cardiac concerns or symptoms Strength training completed today  Goals Unmet:  Not Applicable  Comments: Service time is from 1030 to 1205    Dr. Wesam G. Yacoub is Medical Director for Pulmonary Rehab at [facility]. 

## 2018-06-01 ENCOUNTER — Telehealth (HOSPITAL_COMMUNITY): Payer: Self-pay

## 2018-06-01 NOTE — Telephone Encounter (Signed)
Pt called informing pt of Cardiac and Pulmonary rehab closure for the next 2 weeks.   Eryanna Regal RN, BSN Cardiac and Pulmonary Rehab RN  

## 2018-06-02 ENCOUNTER — Encounter (HOSPITAL_COMMUNITY): Payer: No Typology Code available for payment source

## 2018-06-04 ENCOUNTER — Encounter (HOSPITAL_COMMUNITY): Payer: No Typology Code available for payment source

## 2018-06-08 ENCOUNTER — Encounter (HOSPITAL_COMMUNITY): Payer: Self-pay

## 2018-06-08 NOTE — Progress Notes (Signed)
Pulmonary Individual Treatment Plan  Patient Details  Name: Brett Saunders MRN: [id number] Date of Birth: 08/16/1945 Referring Provider:     Pulmonary Rehab Walk Test from 03/16/2018 in [facility]  Referring Provider  Dr. Yacoub      Initial Encounter Date:    Pulmonary Rehab Walk Test from 03/16/2018 in [facility]  Date  03/16/18      Visit Diagnosis: Chronic obstructive pulmonary disease, unspecified COPD type (HCC)  Patient's Home Medications on Admission:   Current Outpatient Medications:  .  acetaminophen (TYLENOL) 500 MG tablet, Take 500 mg by mouth 2 (two) times daily., Disp: , Rfl:  .  amiodarone (PACERONE) 200 MG tablet, Take 100 mg by mouth daily. , Disp: , Rfl:  .  apixaban (ELIQUIS) 5 MG TABS tablet, Take 2.5 mg by mouth 2 (two) times daily., Disp: , Rfl:  .  azithromycin (ZITHROMAX Z-PAK) 250 MG tablet, 2 po day one, then 1 daily x 4 days (Patient not taking: Reported on 03/13/2018), Disp: 5 tablet, Rfl: 0 .  carvedilol (COREG) 3.125 MG tablet, Take 3.125 mg by mouth 2 (two) times daily with a meal., Disp: , Rfl:  .  diphenhydrAMINE (BENADRYL) 25 MG tablet, Take 25 mg by mouth at bedtime., Disp: , Rfl:  .  furosemide (LASIX) 20 MG tablet, Take 20 mg by mouth daily. , Disp: , Rfl:  .  Glycopyrrolate-Formoterol (BEVESPI AEROSPHERE) 9-4.8 MCG/ACT AERO, Inhale 2 puffs into the lungs 2 (two) times daily., Disp: , Rfl:  .  Liniments (SALONPAS PAIN RELIEF PATCH EX), Place 1 patch onto the skin daily as needed (pain)., Disp: , Rfl:  .  OLODATEROL HCL IN, Inhale 2.5 mg into the lungs every morning. 2 puffs, Disp: , Rfl:  .  oxyCODONE-acetaminophen (PERCOCET) 5-325 MG tablet, Take 1-2 tablets by mouth every 4 (four) hours as needed. (Patient not taking: Reported on 03/13/2018), Disp: 20 tablet, Rfl: 0 .  rOPINIRole (REQUIP) 0.25 MG tablet, Take 0.25 mg by mouth at bedtime. , Disp: , Rfl:  .  sodium chloride 1 g  tablet, Take 1 g by mouth 3 (three) times daily., Disp: , Rfl:   Past Medical History: Past Medical History:  Diagnosis Date  . Aortic anomaly    aortic calcification  . Arrhythmia    a fib  . Cancer (HCC)    lung  . Chronic obstructive lung disease (HCC)   . Hypertension   . Ischemic cardiomyopathy   . Peripheral vascular disease (HCC)   . Stroke (HCC)    oct 2012    Tobacco Use: Social History   Tobacco Use  Smoking Status Light Tobacco Smoker  . Packs/day: 0.10  . Types: Cigarettes  Smokeless Tobacco Never Used  Tobacco Comment   pt smokes 1 cigarrete occasionally. 1 pack "last a few months"    Labs: Recent Review Flowsheet Data    There is no flowsheet data to display.      Capillary Blood Glucose: No results found for: GLUCAP   Pulmonary Assessment Scores:   Pulmonary Function Assessment:   Exercise Target Goals: Exercise Program Goal: Individual exercise prescription set using results from initial 6 min walk test and THRR while considering  patient's activity barriers and safety.   Exercise Prescription Goal: Initial exercise prescription builds to 30-45 minutes a day of aerobic activity, 2-3 days per week.  Home exercise guidelines will be given to patient during program as part of exercise prescription that the   participant will acknowledge.  Activity Barriers & Risk Stratification: Activity Barriers & Cardiac Risk Stratification - 03/13/18 1203      Activity Barriers & Cardiac Risk Stratification   Activity Barriers  Back Problems;Balance Concerns;Shortness of Breath       6 Minute Walk: 6 Minute Walk    Row Name 03/17/18 0652         6 Minute Walk   Distance  1085 feet     Walk Time  6 minutes     # of Rest Breaks  0     MPH  2.05     METS  2.61     RPE  12     Perceived Dyspnea   3     Symptoms  No     Resting HR  64 bpm     Resting BP  114/64     Resting Oxygen Saturation   100 %     Exercise Oxygen Saturation  during 6 min  walk  94 %     Max Ex. HR  106 bpm     Max Ex. BP  122/62     2 Minute Post BP  116/64       Interval HR   1 Minute HR  97     2 Minute HR  106     3 Minute HR  104     4 Minute HR  105     5 Minute HR  105     6 Minute HR  104     2 Minute Post HR  88     Interval Heart Rate?  Yes       Interval Oxygen   Interval Oxygen?  Yes     Baseline Oxygen Saturation %  100 %     1 Minute Oxygen Saturation %  94 %     1 Minute Liters of Oxygen  0 L     2 Minute Oxygen Saturation %  94 %     2 Minute Liters of Oxygen  0 L     3 Minute Oxygen Saturation %  94 %     3 Minute Liters of Oxygen  0 L     4 Minute Oxygen Saturation %  96 %     4 Minute Liters of Oxygen  0 L     5 Minute Oxygen Saturation %  98 %     5 Minute Liters of Oxygen  0 L     6 Minute Oxygen Saturation %  98 %     6 Minute Liters of Oxygen  0 L     2 Minute Post Oxygen Saturation %  100 %     2 Minute Post Liters of Oxygen  0 L        Oxygen Initial Assessment: Oxygen Initial Assessment - 03/17/18 0651      Home Oxygen   Home Oxygen Device  Portable Concentrator;Home Concentrator    Sleep Oxygen Prescription  Continuous    Liters per minute  2    Home Exercise Oxygen Prescription  None    Home at Rest Exercise Oxygen Prescription  None    Compliance with Home Oxygen Use  Yes      Initial 6 min Walk   Oxygen Used  None      Program Oxygen Prescription   Program Oxygen Prescription  None      Intervention   Short Term Goals  To learn  and exhibit compliance with exercise, home and travel O2 prescription;To learn and understand importance of maintaining oxygen saturations>88%;To learn and demonstrate proper use of respiratory medications;To learn and understand importance of monitoring SPO2 with pulse oximeter and demonstrate accurate use of the pulse oximeter.;To learn and demonstrate proper pursed lip breathing techniques or other breathing techniques.    Saunders  Term Goals  Exhibits compliance with exercise,  home and travel O2 prescription;Verbalizes importance of monitoring SPO2 with pulse oximeter and return demonstration;Maintenance of O2 saturations>88%;Exhibits proper breathing techniques, such as pursed lip breathing or other method taught during program session;Compliance with respiratory medication;Demonstrates proper use of MDI's       Oxygen Re-Evaluation: Oxygen Re-Evaluation    Row Name 03/24/18 0725 04/21/18 0737 05/19/18 0709 06/08/18 0951       Program Oxygen Prescription   Program Oxygen Prescription  None  None  None  None      Home Oxygen   Home Oxygen Device  Portable Concentrator;Home Concentrator  Portable Concentrator;Home Concentrator  Portable Concentrator;Home Concentrator  Portable Concentrator;Home Concentrator    Sleep Oxygen Prescription  -  Continuous  Continuous  Continuous    Liters per minute  '2  2  2  2    '$ Home Exercise Oxygen Prescription  None  None  None  None    Home at Rest Exercise Oxygen Prescription  None  None  None  None    Compliance with Home Oxygen Use  Yes  Yes  Yes  Yes      Goals/Expected Outcomes   Short Term Goals  To learn and exhibit compliance with exercise, home and travel O2 prescription;To learn and understand importance of maintaining oxygen saturations>88%;To learn and demonstrate proper use of respiratory medications;To learn and understand importance of monitoring SPO2 with pulse oximeter and demonstrate accurate use of the pulse oximeter.;To learn and demonstrate proper pursed lip breathing techniques or other breathing techniques.  To learn and exhibit compliance with exercise, home and travel O2 prescription;To learn and understand importance of maintaining oxygen saturations>88%;To learn and demonstrate proper use of respiratory medications;To learn and understand importance of monitoring SPO2 with pulse oximeter and demonstrate accurate use of the pulse oximeter.;To learn and demonstrate proper pursed lip breathing techniques or  other breathing techniques.  To learn and exhibit compliance with exercise, home and travel O2 prescription;To learn and understand importance of maintaining oxygen saturations>88%;To learn and demonstrate proper use of respiratory medications;To learn and understand importance of monitoring SPO2 with pulse oximeter and demonstrate accurate use of the pulse oximeter.;To learn and demonstrate proper pursed lip breathing techniques or other breathing techniques.  To learn and exhibit compliance with exercise, home and travel O2 prescription;To learn and understand importance of maintaining oxygen saturations>88%;To learn and demonstrate proper use of respiratory medications;To learn and understand importance of monitoring SPO2 with pulse oximeter and demonstrate accurate use of the pulse oximeter.;To learn and demonstrate proper pursed lip breathing techniques or other breathing techniques.    Saunders  Term Goals  Exhibits compliance with exercise, home and travel O2 prescription;Verbalizes importance of monitoring SPO2 with pulse oximeter and return demonstration;Maintenance of O2 saturations>88%;Exhibits proper breathing techniques, such as pursed lip breathing or other method taught during program session;Compliance with respiratory medication;Demonstrates proper use of MDI's  Exhibits compliance with exercise, home and travel O2 prescription;Verbalizes importance of monitoring SPO2 with pulse oximeter and return demonstration;Maintenance of O2 saturations>88%;Exhibits proper breathing techniques, such as pursed lip breathing or other method taught during program session;Compliance with respiratory medication;Demonstrates  proper use of MDI's  Exhibits compliance with exercise, home and travel O2 prescription;Verbalizes importance of monitoring SPO2 with pulse oximeter and return demonstration;Maintenance of O2 saturations>88%;Exhibits proper breathing techniques, such as pursed lip breathing or other method  taught during program session;Compliance with respiratory medication;Demonstrates proper use of MDI's  Exhibits compliance with exercise, home and travel O2 prescription;Verbalizes importance of monitoring SPO2 with pulse oximeter and return demonstration;Maintenance of O2 saturations>88%;Exhibits proper breathing techniques, such as pursed lip breathing or other method taught during program session;Compliance with respiratory medication;Demonstrates proper use of MDI's    Goals/Expected Outcomes  compliance   compliance   compliance   compliance        Oxygen Discharge (Final Oxygen Re-Evaluation): Oxygen Re-Evaluation - 06/08/18 0951      Program Oxygen Prescription   Program Oxygen Prescription  None      Home Oxygen   Home Oxygen Device  Portable Concentrator;Home Concentrator    Sleep Oxygen Prescription  Continuous    Liters per minute  2    Home Exercise Oxygen Prescription  None    Home at Rest Exercise Oxygen Prescription  None    Compliance with Home Oxygen Use  Yes      Goals/Expected Outcomes   Short Term Goals  To learn and exhibit compliance with exercise, home and travel O2 prescription;To learn and understand importance of maintaining oxygen saturations>88%;To learn and demonstrate proper use of respiratory medications;To learn and understand importance of monitoring SPO2 with pulse oximeter and demonstrate accurate use of the pulse oximeter.;To learn and demonstrate proper pursed lip breathing techniques or other breathing techniques.    Saunders  Term Goals  Exhibits compliance with exercise, home and travel O2 prescription;Verbalizes importance of monitoring SPO2 with pulse oximeter and return demonstration;Maintenance of O2 saturations>88%;Exhibits proper breathing techniques, such as pursed lip breathing or other method taught during program session;Compliance with respiratory medication;Demonstrates proper use of MDI's    Goals/Expected Outcomes  compliance         Initial Exercise Prescription: Initial Exercise Prescription - 03/17/18 0600      Date of Initial Exercise RX and Referring Provider   Date  03/16/18    Referring Provider  Dr. Nelda Marseille      Bike   Level  0.4    Minutes  17      NuStep   Level  2    SPM  80    Minutes  17      Track   Laps  10    Minutes  17      Prescription Details   Frequency (times per week)  2    Duration  Progress to 45 minutes of aerobic exercise without signs/symptoms of physical distress      Intensity   THRR 40-80% of Max Heartrate  58-117    Ratings of Perceived Exertion  11-13    Perceived Dyspnea  0-4      Progression   Progression  Continue to progress workloads to maintain intensity without signs/symptoms of physical distress.      Resistance Training   Training Prescription  Yes    Weight  orange bands    Reps  10-15       Perform Capillary Blood Glucose checks as needed.  Exercise Prescription Changes: Exercise Prescription Changes    Row Name 03/24/18 1400 04/07/18 1200 04/07/18 1300 04/21/18 1200 05/05/18 1200     Response to Exercise   Blood Pressure (Admit)  106/62  110/72  -  118/68  108/56   Blood Pressure (Exercise)  118/60  100/64  -  110/60  120/60   Blood Pressure (Exit)  92/62  102/64  -  98/60  98/56   Heart Rate (Admit)  76 bpm  70 bpm  -  71 bpm  84 bpm   Heart Rate (Exercise)  110 bpm  106 bpm  -  108 bpm  106 bpm   Heart Rate (Exit)  85 bpm  90 bpm  -  68 bpm  95 bpm   Oxygen Saturation (Admit)  100 %  100 %  -  100 %  97 %   Oxygen Saturation (Exercise)  95 %  95 %  -  95 %  100 %   Oxygen Saturation (Exit)  95 %  90 %  -  99 %  97 %   Rating of Perceived Exertion (Exercise)  15  9  -  12  13   Perceived Dyspnea (Exercise)  2  1  -  2  2   Duration  Progress to 45 minutes of aerobic exercise without signs/symptoms of physical distress  Progress to 45 minutes of aerobic exercise without signs/symptoms of physical distress  -  Progress to 45 minutes of  aerobic exercise without signs/symptoms of physical distress  Progress to 45 minutes of aerobic exercise without signs/symptoms of physical distress   Intensity  Other (comment) 40-80% of HRR  THRR unchanged  -  THRR unchanged  THRR unchanged     Progression   Progression  Continue to progress workloads to maintain intensity without signs/symptoms of physical distress.  Continue to progress workloads to maintain intensity without signs/symptoms of physical distress.  -  Continue to progress workloads to maintain intensity without signs/symptoms of physical distress.  Continue to progress workloads to maintain intensity without signs/symptoms of physical distress.     Resistance Training   Training Prescription  Yes  Yes  -  Yes  Yes   Weight  orange bands  orange bands  -  orange bands  orange bands   Reps  10-15  10-15  -  10-15  10-15   Time  10 Minutes  10 Minutes  -  10 Minutes  10 Minutes     Interval Training   Interval Training  -  No  -  -  -     Bike   Level  0.3  -  -  0.6  0.6   Minutes  17  -  -  17  17     NuStep   Level  2  2  -  3  3   SPM  80  80  -  80  80   Minutes  17  17  -  17  17   METs  2.1  1.6  -  2.3  2.5     Track   Laps  10  10  -  16  16   Minutes  17  17  -  17  17     Home Exercise Plan   Plans to continue exercise at  -  -  [employer] (comment) get a gym membership  -  -   Frequency  -  -  Add 2 additional days to program exercise sessions.  -  -   Initial Home Exercises Provided  -  -  04/07/18  -  -   [place] Name 05/19/18 1100 05/19/18 1200 05/28/18  1151         Response to Exercise   Blood Pressure (Admit)  -  104/56  110/68     Blood Pressure (Exercise)  -  124/60  124/62     Blood Pressure (Exit)  -  100/64  110/70     Heart Rate (Admit)  -  84 bpm  69 bpm     Heart Rate (Exercise)  -  104 bpm  76 bpm     Heart Rate (Exit)  -  60 bpm  82 bpm     Oxygen Saturation (Admit)  -  98 %  100 %     Oxygen Saturation (Exercise)  -  90 %   96 %     Oxygen Saturation (Exit)  -  97 %  98 %     Rating of Perceived Exertion (Exercise)  -  11  12     Perceived Dyspnea (Exercise)  -  1  1     Duration  -  Progress to 45 minutes of aerobic exercise without signs/symptoms of physical distress  Progress to 45 minutes of aerobic exercise without signs/symptoms of physical distress     Intensity  -  THRR unchanged  THRR unchanged       Progression   Progression  -  -  Continue to progress workloads to maintain intensity without signs/symptoms of physical distress.       Resistance Training   Training Prescription  -  Yes  Yes     Weight  -  orange bands  orange bands     Reps  -  10-15  10-15     Time  -  10 Minutes  10 Minutes       Interval Training   Interval Training  -  -  No       Bike   Level  -  0.6  0.6     Minutes  -  17  17       NuStep   Level  '4  4  4     '$ SPM  80  80  80     Minutes  34  17  17     METs  1.6  2.1  2.5       Track   Laps  -  14  -     Minutes  -  17  -        Exercise Comments: Exercise Comments    Row Name 04/07/18 1317           Exercise Comments  home exercise complete          Exercise Goals and Review: Exercise Goals    [place] Name 03/13/18 1208             Exercise Goals   Increase Physical Activity  Yes       Intervention  Provide advice, education, support and counseling about physical activity/exercise needs.;Develop an individualized exercise prescription for aerobic and resistive training based on initial evaluation findings, risk stratification, comorbidities and participant's personal goals.       Expected Outcomes  Short Term: Attend rehab on a regular basis to increase amount of physical activity.;Saunders Term: Add in home exercise to make exercise part of routine and to increase amount of physical activity.;Saunders Term: Exercising regularly at least 3-5 days a week.       Increase Strength and Stamina  Yes  Intervention  Provide advice, education, support and  counseling about physical activity/exercise needs.;Develop an individualized exercise prescription for aerobic and resistive training based on initial evaluation findings, risk stratification, comorbidities and participant's personal goals.       Expected Outcomes  Short Term: Increase workloads from initial exercise prescription for resistance, speed, and METs.;Short Term: Perform resistance training exercises routinely during rehab and add in resistance training at home;Saunders Term: Improve cardiorespiratory fitness, muscular endurance and strength as measured by increased METs and functional capacity (6MWT)       Able to understand and use rate of perceived exertion (RPE) scale  Yes       Intervention  Provide education and explanation on how to use RPE scale       Expected Outcomes  Short Term: Able to use RPE daily in rehab to express subjective intensity level;Saunders Term:  Able to use RPE to guide intensity level when exercising independently       Able to understand and use Dyspnea scale  Yes       Intervention  Provide education and explanation on how to use Dyspnea scale       Expected Outcomes  Short Term: Able to use Dyspnea scale daily in rehab to express subjective sense of shortness of breath during exertion;Saunders Term: Able to use Dyspnea scale to guide intensity level when exercising independently       Knowledge and understanding of Target Heart Rate Range (THRR)  Yes       Intervention  Provide education and explanation of THRR including how the numbers were predicted and where they are located for reference       Expected Outcomes  Short Term: Able to state/look up THRR;Saunders Term: Able to use THRR to govern intensity when exercising independently;Short Term: Able to use daily as guideline for intensity in rehab       Understanding of Exercise Prescription  Yes       Intervention  Provide education, explanation, and written materials on patient's individual exercise prescription        Expected Outcomes  Short Term: Able to explain program exercise prescription;Saunders Term: Able to explain home exercise prescription to exercise independently       Improve claudication pain toleration; Improve walking ability  Yes       Intervention  Participate in PAD/SET Rehab 2-3 days a week, walking at home as part of exercise prescription;Attend education sessions to aid in risk factor modification and understanding of disease process       Expected Outcomes  Short Term: Improve walking distance/time to onset of claudication pain;Saunders Term: Improve walking ability and toleration to claudication;Saunders Term: Improve score of PAD questionnaires          Exercise Goals Re-Evaluation : Exercise Goals Re-Evaluation    Row Name 03/24/18 0726 04/21/18 0737 05/19/18 0710 06/08/18 0952       Exercise Goal Re-Evaluation   Exercise Goals Review  Increase Physical Activity;Increase Strength and Stamina;Able to understand and use rate of perceived exertion (RPE) scale;Able to understand and use Dyspnea scale;Knowledge and understanding of Target Heart Rate Range (THRR);Understanding of Exercise Prescription  Increase Physical Activity;Increase Strength and Stamina;Able to understand and use rate of perceived exertion (RPE) scale;Able to understand and use Dyspnea scale;Knowledge and understanding of Target Heart Rate Range (THRR);Understanding of Exercise Prescription  Increase Physical Activity;Increase Strength and Stamina;Able to understand and use rate of perceived exertion (RPE) scale;Able to understand and use Dyspnea scale;Knowledge and understanding of Target  Heart Rate Range (THRR);Understanding of Exercise Prescription  Increase Physical Activity;Increase Strength and Stamina;Able to understand and use rate of perceived exertion (RPE) scale;Able to understand and use Dyspnea scale;Knowledge and understanding of Target Heart Rate Range (THRR);Understanding of Exercise Prescription    Comments  Pt will  begin his first exercise session today. Will monitor and progress as able through the program.  Pt is highly motivated to exercise. After I consulted with pt about home exercise, he went and got a membership at [employer]. Pt is exercising at 2.4 METs and walking 14 laps (1 lap=200 ft). I expect the pt will progress well. Will continue to monitor and progress as able.   Pt is highly motivated to exercise and is especially interested in the education sessions. Pt is limited by leg claudication from PAD, but he is now able to walk 14 laps around the track in 15 minutes (1 lap=200 ft). Pt has increased his METs on the stepper to ~2.5. Will continue to monitor and progress as able.   Pt continued to exercise at ~2.5 METs. Pt reported that he is now able to walk longer distances before his claudication pain limits him. Our department is currently closed and will be for an unknown amount of time. Pt had previously been instructed on home exercise. Will call to follow up and give updates to pt after I recieve them.     Expected Outcomes  Through exercise at rehab and at home, the patient will decrease shortness of breath with daily activities and feel confident in carrying out an exercise regime at home.   Through exercise at rehab and at home, the patient will decrease shortness of breath with daily activities and feel confident in carrying out an exercise regime at home.   Through exercise at rehab and at home, the patient will decrease shortness of breath with daily activities and feel confident in carrying out an exercise regime at home.   Through exercise at rehab and at home, the patient will decrease shortness of breath with daily activities and feel confident in carrying out an exercise regime at home.        Discharge Exercise Prescription (Final Exercise Prescription Changes): Exercise Prescription Changes - 05/28/18 1151      Response to Exercise   Blood Pressure (Admit)  110/68    Blood Pressure  (Exercise)  124/62    Blood Pressure (Exit)  110/70    Heart Rate (Admit)  69 bpm    Heart Rate (Exercise)  76 bpm    Heart Rate (Exit)  82 bpm    Oxygen Saturation (Admit)  100 %    Oxygen Saturation (Exercise)  96 %    Oxygen Saturation (Exit)  98 %    Rating of Perceived Exertion (Exercise)  12    Perceived Dyspnea (Exercise)  1    Duration  Progress to 45 minutes of aerobic exercise without signs/symptoms of physical distress    Intensity  THRR unchanged      Progression   Progression  Continue to progress workloads to maintain intensity without signs/symptoms of physical distress.      Resistance Training   Training Prescription  Yes    Weight  orange bands    Reps  10-15    Time  10 Minutes      Interval Training   Interval Training  No      Bike   Level  0.6    Minutes  17  NuStep   Level  4    SPM  80    Minutes  17    METs  2.5       Nutrition:  Target Goals: Understanding of nutrition guidelines, daily intake of sodium '1500mg'$ , cholesterol '200mg'$ , calories 30% from fat and 7% or less from saturated fats, daily to have 5 or more servings of fruits and vegetables.  Biometrics: Pre Biometrics - 03/13/18 1204      Pre Biometrics   Grip Strength  30 kg        Nutrition Therapy Plan and Nutrition Goals: Nutrition Therapy & Goals - 04/30/18 1216      Nutrition Therapy   Diet  high protein, high calorie      Personal Nutrition Goals   Nutrition Goal  The pt will recognize symptoms that can interfere with adequate oral intake, such as shortness of breath, N/V, early satiety, fatigue, ability to secure and prepare food, taste and smell changes, chewing/swallowing difficulties, and/ or pain when eating.    Personal Goal #2  The pt will consume high-energy, high-nutrient dense beverages when necessary to compensate for decreased oral intake of solid foods.    Personal Goal #3  The pt will have family and friends shop for food when necessary so that  nourishing foods are always available at home.    Personal Goal #4  Identify food quantities necessary to achieve wt gain of  -2# per week to a goal wt gain of 5-24 lb at graduation from pulmonary rehab.      Intervention Plan   Intervention  Prescribe, educate and counsel regarding individualized specific dietary modifications aiming towards targeted core components such as weight, hypertension, lipid management, diabetes, heart failure and other comorbidities.    Expected Outcomes  Short Term Goal: Understand basic principles of dietary content, such as calories, fat, sodium, cholesterol and nutrients.;Saunders Term Goal: Adherence to prescribed nutrition plan.       Nutrition Assessments:   Nutrition Goals Re-Evaluation:   Nutrition Goals Discharge (Final Nutrition Goals Re-Evaluation):   Psychosocial: Target Goals: Acknowledge presence or absence of significant depression and/or stress, maximize coping skills, provide positive support system. Participant is able to verbalize types and ability to use techniques and skills needed for reducing stress and depression.  Initial Review & Psychosocial Screening: Initial Psych Review & Screening - 03/13/18 1212      Initial Review   Current issues with  None Identified      Family Dynamics   Good Support System?  Yes      Barriers   Psychosocial barriers to participate in program  There are no identifiable barriers or psychosocial needs.;The patient should benefit from training in stress management and relaxation.      Screening Interventions   Interventions  Encouraged to exercise;To provide support and resources with identified psychosocial needs    Expected Outcomes  Saunders Term Goal: Stressors or current issues are controlled or eliminated.;Short Term goal: Identification and review with participant of any Quality of Life or Depression concerns found by scoring the questionnaire.;Saunders Term goal: The participant improves quality of Life  and PHQ9 Scores as seen by post scores and/or verbalization of changes       Quality of Life Scores:  Scores of 19 and below usually indicate a poorer quality of life in these areas.  A difference of  2-3 points is a clinically meaningful difference.  A difference of 2-3 points in the total score of the Quality of Life  Index has been associated with significant improvement in overall quality of life, self-image, physical symptoms, and general health in studies assessing change in quality of life.  PHQ-9: Recent Review Flowsheet Data    Depression screen [facility] 2/9 03/13/2018 03/13/2018   Decreased Interest 0 0   Down, Depressed, Hopeless 0 0   PHQ - 2 Score 0 0   Altered sleeping 0 0   Tired, decreased energy 3 3   Change in appetite 3 3   Feeling bad or failure about yourself  0 0   Trouble concentrating 0 0   Moving slowly or fidgety/restless 0 0   Suicidal thoughts 0 0   PHQ-9 Score 6 6   Difficult doing work/chores Somewhat difficult Somewhat difficult     Interpretation of Total Score  Total Score Depression Severity:  1-4 = Minimal depression, 5-9 = Mild depression, 10-14 = Moderate depression, 15-19 = Moderately severe depression, 20-27 = Severe depression   Psychosocial Evaluation and Intervention: Psychosocial Evaluation - 05/18/18 1631      Psychosocial Evaluation & Interventions   Interventions  Stress management education;Relaxation education;Encouraged to exercise with the program and follow exercise prescription    Comments  Pt with recent move from [place] to [place]. good support from wife and grandaughter.    Expected Outcomes  Pt will continue to display positve and healthy coping skills by his family    Continue Psychosocial Services   Follow up required by staff       Psychosocial Re-Evaluation: Psychosocial Re-Evaluation    [place] Name 03/25/18 1645 04/21/18 0956 05/18/18 1631 06/08/18 1610       Psychosocial Re-Evaluation   Current issues with  Current Stress  Concerns  Current Stress Concerns  Current Stress Concerns  Current Stress Concerns    Comments  Pt has recently moved from [place] to [place]. Lives with wife who is a heavy smoker. Pt has moved to be closer to grandaughter, who he states does not come around very often. Pt states that he has a "shop" that he goes out to when his wife smoking bothers him or when he needs to get away.  Pt has recently moved from [place] to [place]. Pt is getting acclimated and is settling in to life in [place]. Lives with wife who is a heavy smoker. Pt has moved to be closer to grandaughter, who he states does not come around very often. Pt states that he has a "shop" that he goes out to when his wife smoking bothers him or when he needs to get away.  Pt has recently moved from [place] to [place]. Pt is settling in to life in [place]. Lives with wife who is a heavy smoker. Pt has moved to be closer to grandaughter, who he states does not come around very often. Pt states that he has a "shop" that he goes out to when his wife smoking bothers him or when he needs to get away.  Pt has recently moved from [place] to [place]. Pt continues to settle in to life in [place]. Lives with wife who is a heavy smoker which continues to cause him alot of stress. Pt states that he has a "shop" that he goes out to when his wife smoking bothers him or when he needs to get away.    Expected Outcomes  pt will become more comfortable with [place] and will maintain coping skills to deal with current stressors.  pt will become more comfortable with  and will maintain coping skills  to deal with current stressors.  pt will continue to settle in with the move [place] and will maintain coping skills to deal with current stressors.  pt will continue to settle in with the move [place] and will maintain coping skills to deal with current stressors.    Interventions  Relaxation education;Stress management education;Encouraged to attend Pulmonary Rehabilitation for the exercise  Relaxation education;Stress management  education;Encouraged to attend Pulmonary Rehabilitation for the exercise  Relaxation education;Stress management education;Encouraged to attend Pulmonary Rehabilitation for the exercise  Relaxation education;Stress management education;Encouraged to attend Pulmonary Rehabilitation for the exercise    Continue Psychosocial Services   Follow up required by staff  -  Follow up required by staff  Follow up required by staff    Comments  pt has moved to [facility] for better air quality to deal with illness  -  -  -      Initial Review   Source of Stress Concerns  Chronic Illness  -  -  -       Psychosocial Discharge (Final Psychosocial Re-Evaluation): Psychosocial Re-Evaluation - 06/08/18 1610      Psychosocial Re-Evaluation   Current issues with  Current Stress Concerns    Comments  Pt has recently moved from [place] to [place]. Pt continues to settle in to life in [place]. Lives with wife who is a heavy smoker which continues to cause him alot of stress. Pt states that he has a "shop" that he goes out to when his wife smoking bothers him or when he needs to get away.    Expected Outcomes  pt will continue to settle in with the move [place] and will maintain coping skills to deal with current stressors.    Interventions  Relaxation education;Stress management education;Encouraged to attend Pulmonary Rehabilitation for the exercise    Continue Psychosocial Services   Follow up required by staff       Education: Education Goals: Education classes will be provided on a weekly basis, covering required topics. Participant will state understanding/return demonstration of topics presented.  Learning Barriers/Preferences: Learning Barriers/Preferences - 03/13/18 1218      Learning Barriers/Preferences   Learning Barriers  Sight;Hearing    Learning Preferences  Written Material;Computer/Internet;Group Instruction       Education Topics: Risk Factor Reduction:  -Group instruction that is supported by a PowerPoint  presentation. Instructor discusses the definition of a risk factor, different risk factors for pulmonary disease, and how the heart and lungs work together.     Nutrition for Pulmonary Patient:  -Group instruction provided by PowerPoint slides, verbal discussion, and written materials to support subject matter. The instructor gives an explanation and review of healthy diet recommendations, which includes a discussion on weight management, recommendations for fruit and vegetable consumption, as well as protein, fluid, caffeine, fiber, sodium, sugar, and alcohol. Tips for eating when patients are short of breath are discussed.   PULMONARY REHAB CHRONIC OBSTRUCTIVE PULMONARY DISEASE from 05/28/2018 in [place]  Date  03/26/18  Educator  Rodman Pickle  Instruction Review Code  1- Verbalizes Understanding      Pursed Lip Breathing:  -Group instruction that is supported by demonstration and informational handouts. Instructor discusses the benefits of pursed lip and diaphragmatic breathing and detailed demonstration on how to preform both.     Oxygen Safety:  -Group instruction provided by PowerPoint, verbal discussion, and written material to support subject matter. There is an overview of "What is Oxygen" and "Why do we need  it".  Instructor also reviews how to create a safe environment for oxygen use, the importance of using oxygen as prescribed, and the risks of noncompliance. There is a brief discussion on traveling with oxygen and resources the patient may utilize.   PULMONARY REHAB CHRONIC OBSTRUCTIVE PULMONARY DISEASE from 05/28/2018 in [place]  Date  04/23/18  Educator  Cloyde Reams  Instruction Review Code  1- Verbalizes Understanding      Oxygen Equipment:  -Group instruction provided by [employer] utilizing handouts, written materials, and Insurance underwriter.   Signs and Symptoms:  -Group instruction provided by  written material and verbal discussion to support subject matter. Warning signs and symptoms of infection, stroke, and heart attack are reviewed and when to call the physician/911 reinforced. Tips for preventing the spread of infection discussed.   PULMONARY REHAB CHRONIC OBSTRUCTIVE PULMONARY DISEASE from 05/28/2018 in [place]  Date  05/14/18  Educator  Remo Lipps  Instruction Review Code  1- Verbalizes Understanding      Advanced Directives:  -Group instruction provided by verbal instruction and written material to support subject matter. Instructor reviews Advanced Directive laws and proper instruction for filling out document.   Pulmonary Video:  -Group video education that reviews the importance of medication and oxygen compliance, exercise, good nutrition, pulmonary hygiene, and pursed lip and diaphragmatic breathing for the pulmonary patient.   Exercise for the Pulmonary Patient:  -Group instruction that is supported by a PowerPoint presentation. Instructor discusses benefits of exercise, core components of exercise, frequency, duration, and intensity of an exercise routine, importance of utilizing pulse oximetry during exercise, safety while exercising, and options of places to exercise outside of rehab.     PULMONARY REHAB CHRONIC OBSTRUCTIVE PULMONARY DISEASE from 05/28/2018 in [place]  Date  05/07/18  Educator  Kirk Ruths  Instruction Review Code  1- Verbalizes Understanding      Pulmonary Medications:  -Verbally interactive group education provided by instructor with focus on inhaled medications and proper administration.   PULMONARY REHAB CHRONIC OBSTRUCTIVE PULMONARY DISEASE from 05/28/2018 in [place]  Date  04/07/18  Educator  pharmacy  Instruction Review Code  1- Verbalizes Understanding      Anatomy and Physiology of the Respiratory System and Intimacy:  -Group instruction  provided by PowerPoint, verbal discussion, and written material to support subject matter. Instructor reviews respiratory cycle and anatomical components of the respiratory system and their functions. Instructor also reviews differences in obstructive and restrictive respiratory diseases with examples of each. Intimacy, Sex, and Sexuality differences are reviewed with a discussion on how relationships can change when diagnosed with pulmonary disease. Common sexual concerns are reviewed.   PULMONARY REHAB CHRONIC OBSTRUCTIVE PULMONARY DISEASE from 05/28/2018 in [place]  Date  05/28/18  Educator  rn  Instruction Review Code  1- Verbalizes Understanding      MD DAY -A group question and answer session with a medical doctor that allows participants to ask questions that relate to their pulmonary disease state.   OTHER EDUCATION -Group or individual verbal, written, or video instructions that support the educational goals of the pulmonary rehab program.   PULMONARY REHAB CHRONIC OBSTRUCTIVE PULMONARY DISEASE from 05/28/2018 in [place]  Date  04/30/18  Educator  Iona Beard [[employer]]  Instruction Review Code  1- Verbalizes Understanding      Holiday Eating Survival Tips:  -Group instruction provided  by PowerPoint slides, verbal discussion, and written materials to support subject matter. The instructor gives patients tips, tricks, and techniques to help them not only survive but enjoy the holidays despite the onslaught of food that accompanies the holidays.   Knowledge Questionnaire Score:   Core Components/Risk Factors/Patient Goals at Admission: Personal Goals and Risk Factors at Admission - 03/13/18 1219      Core Components/Risk Factors/Patient Goals on Admission    Weight Management  Weight Gain    Tobacco Cessation  Yes    Number of packs per day  vaping and occasional cigarette use    Intervention  Assist the  participant in steps to quit. Provide individualized education and counseling about committing to Tobacco Cessation, relapse prevention, and pharmacological support that can be provided by physician.;Advice worker, assist with locating and accessing local/national Quit Smoking programs, and support quit date choice.    Expected Outcomes  Short Term: Will demonstrate readiness to quit, by selecting a quit date.;Short Term: Will quit all tobacco product use, adhering to prevention of relapse plan.    Improve shortness of breath with ADL's  Yes    Intervention  Provide education, individualized exercise plan and daily activity instruction to help decrease symptoms of SOB with activities of daily living.    Expected Outcomes  Short Term: Improve cardiorespiratory fitness to achieve a reduction of symptoms when performing ADLs;Saunders Term: Be able to perform more ADLs without symptoms or delay the onset of symptoms    Stress  Yes    Intervention  Offer individual and/or small group education and counseling on adjustment to heart disease, stress management and health-related lifestyle change. Teach and support self-help strategies.;Refer participants experiencing significant psychosocial distress to appropriate mental health specialists for further evaluation and treatment. When possible, include family members and significant others in education/counseling sessions.    Expected Outcomes  Short Term: Participant demonstrates changes in health-related behavior, relaxation and other stress management skills, ability to obtain effective social support, and compliance with psychotropic medications if prescribed.;Saunders Term: Emotional wellbeing is indicated by absence of clinically significant psychosocial distress or social isolation.       Core Components/Risk Factors/Patient Goals Review:  Goals and Risk Factor Review    Row Name 03/13/18 1222 03/25/18 1653 04/21/18 0957 05/18/18 1633 06/08/18 1611      Core Components/Risk Factors/Patient Goals Review   Personal Goals Review  Weight Management/Obesity;Tobacco Cessation;Improve shortness of breath with ADL's;Develop more efficient breathing techniques such as purse lipped breathing and diaphragmatic breathing and practicing self-pacing with activity.;Increase knowledge of respiratory medications and ability to use respiratory devices properly.;Stress  Weight Management/Obesity;Tobacco Cessation;Improve shortness of breath with ADL's;Develop more efficient breathing techniques such as purse lipped breathing and diaphragmatic breathing and practicing self-pacing with activity.;Increase knowledge of respiratory medications and ability to use respiratory devices properly.;Stress  Weight Management/Obesity;Tobacco Cessation;Improve shortness of breath with ADL's;Develop more efficient breathing techniques such as purse lipped breathing and diaphragmatic breathing and practicing self-pacing with activity.;Increase knowledge of respiratory medications and ability to use respiratory devices properly.;Stress  Weight Management/Obesity;Tobacco Cessation;Improve shortness of breath with ADL's;Develop more efficient breathing techniques such as purse lipped breathing and diaphragmatic breathing and practicing self-pacing with activity.;Increase knowledge of respiratory medications and ability to use respiratory devices properly.;Stress  Weight Management/Obesity;Tobacco Cessation;Improve shortness of breath with ADL's;Develop more efficient breathing techniques such as purse lipped breathing and diaphragmatic breathing and practicing self-pacing with activity.;Increase knowledge of respiratory medications and ability to use respiratory devices properly.;Stress   Review  -  Pt has completes 1 day of exercise and no education. Pt completed 10 laps around the track, a level 2 on the nustep, and a level of 0.3 on the airdyne. Pt reports no changes in smoking habits, but  will hopefully decrease use. pt will learn PLB techniques and implement then with the assistance of staff. Anticipate will moke progress for the next 30 day review.   Pt has completes 5 day of exercise and 3 education classes. Pt workloads of 11-14 laps around the track, a increase to level 3 on the nustep, and an increase to level 0.4 on the airdyne. Pt reports no changes in smoking habits, but will hopefully decrease use. pt will learn PLB techniques and implement then with the assistance of staff. Anticipate will moke progress for the next 30 day review.   Pt has completes 13 day of exercise and 7 education classes and had settled into rehab nicely. Pt workloads of 12-14 laps around the track, a increase to level 4 on the nustep, and an increase to level 0.6 on the airdyne. Pt reports no changes in smoking habits, but will hopefully decrease use. Pt wife maintains as a heavy smoker. pt will learn PLB techniques and implement then with the assistance of staff. Will continue to monitor for progress for the next 30 day review.   Pt has completes 16 day of exercise and 9 education classes and had settled into rehab nicely. Pt workloads of 14 laps around the track, maintaining level 4 on the nustep, and level 0.6 on the airdyne. Pt reports no changes in smoking habits, but will hopefully decrease use. Pt wife maintains as a heavy smoker. pt will learn PLB techniques and implement then with the assistance of staff. Rehab is currently closed and will plan to re-evaulate pt progress upon reopening.   Expected Outcomes  -  see admission goals and outcomes  see admission goals and outcomes  see admission goals and outcomes  see admission goals and outcomes      Core Components/Risk Factors/Patient Goals at Discharge (Final Review):  Goals and Risk Factor Review - 06/08/18 1611      Core Components/Risk Factors/Patient Goals Review   Personal Goals Review  Weight Management/Obesity;Tobacco Cessation;Improve  shortness of breath with ADL's;Develop more efficient breathing techniques such as purse lipped breathing and diaphragmatic breathing and practicing self-pacing with activity.;Increase knowledge of respiratory medications and ability to use respiratory devices properly.;Stress    Review  Pt has completes 16 day of exercise and 9 education classes and had settled into rehab nicely. Pt workloads of 14 laps around the track, maintaining level 4 on the nustep, and level 0.6 on the airdyne. Pt reports no changes in smoking habits, but will hopefully decrease use. Pt wife maintains as a heavy smoker. pt will learn PLB techniques and implement then with the assistance of staff. Rehab is currently closed and will plan to re-evaulate pt progress upon reopening.    Expected Outcomes  see admission goals and outcomes       ITP Comments: ITP Comments    Row Name 03/13/18 1126 03/25/18 1643 04/21/18 0955 05/18/18 1630 06/08/18 1609   ITP Comments  Dr. Manfred Arch medical director for pulmonary rehab.  Dr. Manfred Arch medical director for pulmonary rehab.  Dr. Manfred Arch medical director for pulmonary rehab.  Dr. Manfred Arch medical director for pulmonary rehab.  Dr. Manfred Arch medical director for pulmonary rehab.      Comments: ITP REVIEW Pt is making  expected progress toward personal goals after completing 16 sessions. Rehab is currently closed and will plan to re-evaulate pt progress upon reopening.  Joycelyn Man RN, BSN Cardiac and Pulmonary Rehab RN

## 2018-06-09 ENCOUNTER — Encounter (HOSPITAL_COMMUNITY): Payer: No Typology Code available for payment source

## 2018-06-09 ENCOUNTER — Telehealth (HOSPITAL_COMMUNITY): Payer: Self-pay

## 2018-06-11 ENCOUNTER — Encounter (HOSPITAL_COMMUNITY): Payer: No Typology Code available for payment source

## 2018-06-16 ENCOUNTER — Encounter (HOSPITAL_COMMUNITY): Payer: No Typology Code available for payment source

## 2018-06-18 ENCOUNTER — Encounter (HOSPITAL_COMMUNITY): Payer: No Typology Code available for payment source

## 2018-06-23 ENCOUNTER — Encounter (HOSPITAL_COMMUNITY): Payer: No Typology Code available for payment source

## 2018-06-25 ENCOUNTER — Encounter (HOSPITAL_COMMUNITY): Payer: No Typology Code available for payment source

## 2018-06-30 ENCOUNTER — Telehealth (HOSPITAL_COMMUNITY): Payer: Self-pay | Admitting: *Deleted

## 2018-06-30 NOTE — Progress Notes (Signed)
Brett Saunders's authorization expires 06/30/2018 for pulmonary rehab through the [place].  For the last 2 weeks pulmonary rehab has been closed d/t the COVID-19 precautions.  Brett Saunders met his program goals and worked very hard.  His greatest accomplishment was after completing the program he now is able to walk up his steep driveway without difficulty and is able to perform more chores in his yard easier.  He is discharged from pulmonary rehab having met his program goals.

## 2018-06-30 NOTE — Addendum Note (Signed)
Encounter addended by: Lance Morin, RN on: 06/30/2018 12:50 PM  Actions taken: Clinical Note Signed

## 2018-08-20 NOTE — Addendum Note (Signed)
Encounter addended by: Ivonne Andrew, RD on: 08/20/2018 2:27 PM  Actions taken: Flowsheet data copied forward, Visit Navigator Flowsheet section accepted

## 2018-09-07 NOTE — Addendum Note (Signed)
Encounter addended by: Lance Morin, RN on: 09/07/2018 9:10 AM  Actions taken: Clinical Note Signed, Episode resolved

## 2018-09-07 NOTE — Progress Notes (Signed)
Discharge Progress Report  Patient Details  Name: Brett Saunders MRN: [id number] Date of Birth: Jan 15, 1946 Referring Provider:     Pulmonary Rehab Walk Test from 03/16/2018 in [place]  Referring Provider  Dr. Nelda Marseille       Number of Visits: 16  Reason for Discharge:  Patient has met program and personal goals.  Smoking History:  Social History   Tobacco Use  Smoking Status Light Tobacco Smoker  . Packs/day: 0.10  . Types: Cigarettes  Smokeless Tobacco Never Used  Tobacco Comment   pt smokes 1 cigarrete occasionally. 1 pack "last a few months"    Diagnosis:  Chronic obstructive pulmonary disease, unspecified COPD type ([place])  ADL [facility]:   Initial Exercise Prescription: Initial Exercise Prescription - 03/17/18 0600      Date of Initial Exercise RX and Referring Provider   Date  03/16/18    Referring Provider  Dr. Nelda Marseille      Bike   Level  0.4    Minutes  17      NuStep   Level  2    SPM  80    Minutes  17      Track   Laps  10    Minutes  17      Prescription Details   Frequency (times per week)  2    Duration  Progress to 45 minutes of aerobic exercise without signs/symptoms of physical distress      Intensity   THRR 40-80% of Max Heartrate  58-117    Ratings of Perceived Exertion  11-13    Perceived Dyspnea  0-4      Progression   Progression  Continue to progress workloads to maintain intensity without signs/symptoms of physical distress.      Resistance Training   Training Prescription  Yes    Weight  orange bands    Reps  10-15       Discharge Exercise Prescription (Final Exercise Prescription Changes): Exercise Prescription Changes - 05/28/18 1151      Response to Exercise   Blood Pressure (Admit)  110/68    Blood Pressure (Exercise)  124/62    Blood Pressure (Exit)  110/70    Heart Rate (Admit)  69 bpm    Heart Rate (Exercise)  76 bpm    Heart Rate (Exit)  82 bpm    Oxygen Saturation (Admit)  100 %     Oxygen Saturation (Exercise)  96 %    Oxygen Saturation (Exit)  98 %    Rating of Perceived Exertion (Exercise)  12    Perceived Dyspnea (Exercise)  1    Duration  Progress to 45 minutes of aerobic exercise without signs/symptoms of physical distress    Intensity  THRR unchanged      Progression   Progression  Continue to progress workloads to maintain intensity without signs/symptoms of physical distress.      Resistance Training   Training Prescription  Yes    Weight  orange bands    Reps  10-15    Time  10 Minutes      Interval Training   Interval Training  No      Bike   Level  0.6    Minutes  17      NuStep   Level  4    SPM  80    Minutes  17    METs  2.5       Functional Capacity: 6 Minute  Walk    Row Name 03/17/18 0652         6 Minute Walk   Distance  1085 feet     Walk Time  6 minutes     # of Rest Breaks  0     MPH  2.05     METS  2.61     RPE  12     Perceived Dyspnea   3     Symptoms  No     Resting HR  64 bpm     Resting BP  114/64     Resting Oxygen Saturation   100 %     Exercise Oxygen Saturation  during 6 min walk  94 %     Max Ex. HR  106 bpm     Max Ex. BP  122/62     2 Minute Post BP  116/64       Interval HR   1 Minute HR  97     2 Minute HR  106     3 Minute HR  104     4 Minute HR  105     5 Minute HR  105     6 Minute HR  104     2 Minute Post HR  88     Interval Heart Rate?  Yes       Interval Oxygen   Interval Oxygen?  Yes     Baseline Oxygen Saturation %  100 %     1 Minute Oxygen Saturation %  94 %     1 Minute Liters of Oxygen  0 L     2 Minute Oxygen Saturation %  94 %     2 Minute Liters of Oxygen  0 L     3 Minute Oxygen Saturation %  94 %     3 Minute Liters of Oxygen  0 L     4 Minute Oxygen Saturation %  96 %     4 Minute Liters of Oxygen  0 L     5 Minute Oxygen Saturation %  98 %     5 Minute Liters of Oxygen  0 L     6 Minute Oxygen Saturation %  98 %     6 Minute Liters of Oxygen  0 L     2  Minute Post Oxygen Saturation %  100 %     2 Minute Post Liters of Oxygen  0 L        Psychological, QOL, Others - Outcomes: PHQ 2/9: Depression screen [facility] 2/9 03/13/2018 03/13/2018  Decreased Interest 0 0  Down, Depressed, Hopeless 0 0  PHQ - 2 Score 0 0  Altered sleeping 0 0  Tired, decreased energy 3 3  Change in appetite 3 3  Feeling bad or failure about yourself  0 0  Trouble concentrating 0 0  Moving slowly or fidgety/restless 0 0  Suicidal thoughts 0 0  PHQ-9 Score 6 6  Difficult doing work/chores Somewhat difficult Somewhat difficult    Quality of Life:   Personal Goals: Goals established at orientation with interventions provided to work toward goal. Personal Goals and Risk Factors at Admission - 03/13/18 1219      Core Components/Risk Factors/Patient Goals on Admission    Weight Management  Weight Gain    Tobacco Cessation  Yes    Number of packs per day  vaping and occasional cigarette use    Intervention  Assist  the participant in steps to quit. Provide individualized education and counseling about committing to Tobacco Cessation, relapse prevention, and pharmacological support that can be provided by physician.;Advice worker, assist with locating and accessing local/national Quit Smoking programs, and support quit date choice.    Expected Outcomes  Short Term: Will demonstrate readiness to quit, by selecting a quit date.;Short Term: Will quit all tobacco product use, adhering to prevention of relapse plan.    Improve shortness of breath with ADL's  Yes    Intervention  Provide education, individualized exercise plan and daily activity instruction to help decrease symptoms of SOB with activities of daily living.    Expected Outcomes  Short Term: Improve cardiorespiratory fitness to achieve a reduction of symptoms when performing ADLs;Long Term: Be able to perform more ADLs without symptoms or delay the onset of symptoms    Stress  Yes    Intervention   Offer individual and/or small group education and counseling on adjustment to heart disease, stress management and health-related lifestyle change. Teach and support self-help strategies.;Refer participants experiencing significant psychosocial distress to appropriate mental health specialists for further evaluation and treatment. When possible, include family members and significant others in education/counseling sessions.    Expected Outcomes  Short Term: Participant demonstrates changes in health-related behavior, relaxation and other stress management skills, ability to obtain effective social support, and compliance with psychotropic medications if prescribed.;Long Term: Emotional wellbeing is indicated by absence of clinically significant psychosocial distress or social isolation.        Personal Goals Discharge: Goals and Risk Factor Review    Row Name 03/13/18 1222 03/25/18 1653 04/21/18 0957 05/18/18 1633 06/08/18 1611     Core Components/Risk Factors/Patient Goals Review   Personal Goals Review  Weight Management/Obesity;Tobacco Cessation;Improve shortness of breath with ADL's;Develop more efficient breathing techniques such as purse lipped breathing and diaphragmatic breathing and practicing self-pacing with activity.;Increase knowledge of respiratory medications and ability to use respiratory devices properly.;Stress  Weight Management/Obesity;Tobacco Cessation;Improve shortness of breath with ADL's;Develop more efficient breathing techniques such as purse lipped breathing and diaphragmatic breathing and practicing self-pacing with activity.;Increase knowledge of respiratory medications and ability to use respiratory devices properly.;Stress  Weight Management/Obesity;Tobacco Cessation;Improve shortness of breath with ADL's;Develop more efficient breathing techniques such as purse lipped breathing and diaphragmatic breathing and practicing self-pacing with activity.;Increase knowledge of  respiratory medications and ability to use respiratory devices properly.;Stress  Weight Management/Obesity;Tobacco Cessation;Improve shortness of breath with ADL's;Develop more efficient breathing techniques such as purse lipped breathing and diaphragmatic breathing and practicing self-pacing with activity.;Increase knowledge of respiratory medications and ability to use respiratory devices properly.;Stress  Weight Management/Obesity;Tobacco Cessation;Improve shortness of breath with ADL's;Develop more efficient breathing techniques such as purse lipped breathing and diaphragmatic breathing and practicing self-pacing with activity.;Increase knowledge of respiratory medications and ability to use respiratory devices properly.;Stress   Review  -  Pt has completes 1 day of exercise and no education. Pt completed 10 laps around the track, a level 2 on the nustep, and a level of 0.3 on the airdyne. Pt reports no changes in smoking habits, but will hopefully decrease use. pt will learn PLB techniques and implement then with the assistance of staff. Anticipate will moke progress for the next 30 day review.   Pt has completes 5 day of exercise and 3 education classes. Pt workloads of 11-14 laps around the track, a increase to level 3 on the nustep, and an increase to level 0.4 on the airdyne. Pt reports no changes in  smoking habits, but will hopefully decrease use. pt will learn PLB techniques and implement then with the assistance of staff. Anticipate will moke progress for the next 30 day review.   Pt has completes 13 day of exercise and 7 education classes and had settled into rehab nicely. Pt workloads of 12-14 laps around the track, a increase to level 4 on the nustep, and an increase to level 0.6 on the airdyne. Pt reports no changes in smoking habits, but will hopefully decrease use. Pt wife maintains as a heavy smoker. pt will learn PLB techniques and implement then with the assistance of staff. Will continue to  monitor for progress for the next 30 day review.   Pt has completes 16 day of exercise and 9 education classes and had settled into rehab nicely. Pt workloads of 14 laps around the track, maintaining level 4 on the nustep, and level 0.6 on the airdyne. Pt reports no changes in smoking habits, but will hopefully decrease use. Pt wife maintains as a heavy smoker. pt will learn PLB techniques and implement then with the assistance of staff. Rehab is currently closed and will plan to re-evaulate pt progress upon reopening.   Expected Outcomes  -  see admission goals and outcomes  see admission goals and outcomes  see admission goals and outcomes  see admission goals and outcomes      Exercise Goals and Review: Exercise Goals    Row Name 03/13/18 1208             Exercise Goals   Increase Physical Activity  Yes       Intervention  Provide advice, education, support and counseling about physical activity/exercise needs.;Develop an individualized exercise prescription for aerobic and resistive training based on initial evaluation findings, risk stratification, comorbidities and participant's personal goals.       Expected Outcomes  Short Term: Attend rehab on a regular basis to increase amount of physical activity.;Long Term: Add in home exercise to make exercise part of routine and to increase amount of physical activity.;Long Term: Exercising regularly at least 3-5 days a week.       Increase Strength and Stamina  Yes       Intervention  Provide advice, education, support and counseling about physical activity/exercise needs.;Develop an individualized exercise prescription for aerobic and resistive training based on initial evaluation findings, risk stratification, comorbidities and participant's personal goals.       Expected Outcomes  Short Term: Increase workloads from initial exercise prescription for resistance, speed, and METs.;Short Term: Perform resistance training exercises routinely during  rehab and add in resistance training at home;Long Term: Improve cardiorespiratory fitness, muscular endurance and strength as measured by increased METs and functional capacity (6MWT)       Able to understand and use rate of perceived exertion (RPE) scale  Yes       Intervention  Provide education and explanation on how to use RPE scale       Expected Outcomes  Short Term: Able to use RPE daily in rehab to express subjective intensity level;Long Term:  Able to use RPE to guide intensity level when exercising independently       Able to understand and use Dyspnea scale  Yes       Intervention  Provide education and explanation on how to use Dyspnea scale       Expected Outcomes  Short Term: Able to use Dyspnea scale daily in rehab to express subjective sense of shortness of breath  during exertion;Long Term: Able to use Dyspnea scale to guide intensity level when exercising independently       Knowledge and understanding of Target Heart Rate Range (THRR)  Yes       Intervention  Provide education and explanation of THRR including how the numbers were predicted and where they are located for reference       Expected Outcomes  Short Term: Able to state/look up THRR;Long Term: Able to use THRR to govern intensity when exercising independently;Short Term: Able to use daily as guideline for intensity in rehab       Understanding of Exercise Prescription  Yes       Intervention  Provide education, explanation, and written materials on patient's individual exercise prescription       Expected Outcomes  Short Term: Able to explain program exercise prescription;Long Term: Able to explain home exercise prescription to exercise independently       Improve claudication pain toleration; Improve walking ability  Yes       Intervention  Participate in PAD/SET Rehab 2-3 days a week, walking at home as part of exercise prescription;Attend education sessions to aid in risk factor modification and understanding of disease  process       Expected Outcomes  Short Term: Improve walking distance/time to onset of claudication pain;Long Term: Improve walking ability and toleration to claudication;Long Term: Improve score of PAD questionnaires          Exercise Goals Re-Evaluation: Exercise Goals Re-Evaluation    Row Name 03/24/18 0726 04/21/18 0737 05/19/18 0710 06/08/18 0952       Exercise Goal Re-Evaluation   Exercise Goals Review  Increase Physical Activity;Increase Strength and Stamina;Able to understand and use rate of perceived exertion (RPE) scale;Able to understand and use Dyspnea scale;Knowledge and understanding of Target Heart Rate Range (THRR);Understanding of Exercise Prescription  Increase Physical Activity;Increase Strength and Stamina;Able to understand and use rate of perceived exertion (RPE) scale;Able to understand and use Dyspnea scale;Knowledge and understanding of Target Heart Rate Range (THRR);Understanding of Exercise Prescription  Increase Physical Activity;Increase Strength and Stamina;Able to understand and use rate of perceived exertion (RPE) scale;Able to understand and use Dyspnea scale;Knowledge and understanding of Target Heart Rate Range (THRR);Understanding of Exercise Prescription  Increase Physical Activity;Increase Strength and Stamina;Able to understand and use rate of perceived exertion (RPE) scale;Able to understand and use Dyspnea scale;Knowledge and understanding of Target Heart Rate Range (THRR);Understanding of Exercise Prescription    Comments  Pt will begin his first exercise session today. Will monitor and progress as able through the program.  Pt is highly motivated to exercise. After I consulted with pt about home exercise, he went and got a membership at [employer]. Pt is exercising at 2.4 METs and walking 14 laps (1 lap=200 ft). I expect the pt will progress well. Will continue to monitor and progress as able.   Pt is highly motivated to exercise and is especially interested  in the education sessions. Pt is limited by leg claudication from PAD, but he is now able to walk 14 laps around the track in 15 minutes (1 lap=200 ft). Pt has increased his METs on the stepper to ~2.5. Will continue to monitor and progress as able.   Pt continued to exercise at ~2.5 METs. Pt reported that he is now able to walk longer distances before his claudication pain limits him. Our department is currently closed and will be for an unknown amount of time. Pt had previously been instructed on  home exercise. Will call to follow up and give updates to pt after I recieve them.     Expected Outcomes  Through exercise at rehab and at home, the patient will decrease shortness of breath with daily activities and feel confident in carrying out an exercise regime at home.   Through exercise at rehab and at home, the patient will decrease shortness of breath with daily activities and feel confident in carrying out an exercise regime at home.   Through exercise at rehab and at home, the patient will decrease shortness of breath with daily activities and feel confident in carrying out an exercise regime at home.   Through exercise at rehab and at home, the patient will decrease shortness of breath with daily activities and feel confident in carrying out an exercise regime at home.        Nutrition & Weight - Outcomes: Pre Biometrics - 03/13/18 1204      Pre Biometrics   Grip Strength  30 kg        Nutrition: Nutrition Therapy & Goals - 08/20/18 1425      Nutrition Therapy   Diet  high protein, high calorie      Personal Nutrition Goals   Nutrition Goal  The pt will recognize symptoms that can interfere with adequate oral intake, such as shortness of breath, N/V, early satiety, fatigue, ability to secure and prepare food, taste and smell changes, chewing/swallowing difficulties, and/ or pain when eating.    Personal Goal #2  The pt will consume high-energy, high-nutrient dense beverages when necessary  to compensate for decreased oral intake of solid foods.    Personal Goal #3  The pt will have family and friends shop for food when necessary so that nourishing foods are always available at home.    Personal Goal #4  Identify food quantities necessary to achieve wt gain of  -2# per week to a goal wt gain of 5-24 lb at graduation from pulmonary rehab.      Intervention Plan   Intervention  Prescribe, educate and counsel regarding individualized specific dietary modifications aiming towards targeted core components such as weight, hypertension, lipid management, diabetes, heart failure and other comorbidities.    Expected Outcomes  Short Term Goal: Understand basic principles of dietary content, such as calories, fat, sodium, cholesterol and nutrients.;Long Term Goal: Adherence to prescribed nutrition plan.       Nutrition Discharge: Nutrition Assessments - 08/20/18 1425      Rate Your Plate Scores   Pre Score  --   pt did not complete pre survey   Post Score  --   pt did not complete post survey      Education Questionnaire Score:   Goals reviewed with patient; copy given to patient.

## 2018-12-22 ENCOUNTER — Emergency Department (HOSPITAL_COMMUNITY): Payer: No Typology Code available for payment source

## 2018-12-22 ENCOUNTER — Inpatient Hospital Stay (HOSPITAL_COMMUNITY)
Admission: EM | Admit: 2018-12-22 | Discharge: 2018-12-23 | DRG: 309 | Disposition: A | Discharge status: Home / Self Care | Payer: No Typology Code available for payment source | Attending: Cardiology | Admitting: Cardiology

## 2018-12-22 ENCOUNTER — Other Ambulatory Visit: Payer: Self-pay

## 2018-12-22 ENCOUNTER — Encounter (HOSPITAL_COMMUNITY): Payer: Self-pay | Admitting: Emergency Medicine

## 2018-12-22 DIAGNOSIS — I739 Peripheral vascular disease, unspecified: Secondary | ICD-10-CM | POA: Diagnosis present

## 2018-12-22 DIAGNOSIS — I428 Other cardiomyopathies: Secondary | ICD-10-CM | POA: Diagnosis not present

## 2018-12-22 DIAGNOSIS — N183 Chronic kidney disease, stage 3 unspecified: Secondary | ICD-10-CM | POA: Diagnosis present

## 2018-12-22 DIAGNOSIS — L97519 Non-pressure chronic ulcer of other part of right foot with unspecified severity: Secondary | ICD-10-CM | POA: Diagnosis present

## 2018-12-22 DIAGNOSIS — I4891 Unspecified atrial fibrillation: Secondary | ICD-10-CM

## 2018-12-22 DIAGNOSIS — Z923 Personal history of irradiation: Secondary | ICD-10-CM

## 2018-12-22 DIAGNOSIS — Z7901 Long term (current) use of anticoagulants: Secondary | ICD-10-CM

## 2018-12-22 DIAGNOSIS — Z4502 Encounter for adjustment and management of automatic implantable cardiac defibrillator: Secondary | ICD-10-CM | POA: Diagnosis not present

## 2018-12-22 DIAGNOSIS — F4310 Post-traumatic stress disorder, unspecified: Secondary | ICD-10-CM | POA: Diagnosis present

## 2018-12-22 DIAGNOSIS — I480 Paroxysmal atrial fibrillation: Principal | ICD-10-CM | POA: Diagnosis present

## 2018-12-22 DIAGNOSIS — R0602 Shortness of breath: Secondary | ICD-10-CM | POA: Diagnosis not present

## 2018-12-22 DIAGNOSIS — I130 Hypertensive heart and chronic kidney disease with heart failure and stage 1 through stage 4 chronic kidney disease, or unspecified chronic kidney disease: Secondary | ICD-10-CM | POA: Diagnosis present

## 2018-12-22 DIAGNOSIS — I472 Ventricular tachycardia, unspecified: Secondary | ICD-10-CM

## 2018-12-22 DIAGNOSIS — E785 Hyperlipidemia, unspecified: Secondary | ICD-10-CM | POA: Diagnosis present

## 2018-12-22 DIAGNOSIS — Z85118 Personal history of other malignant neoplasm of bronchus and lung: Secondary | ICD-10-CM

## 2018-12-22 DIAGNOSIS — I998 Other disorder of circulatory system: Secondary | ICD-10-CM | POA: Diagnosis present

## 2018-12-22 DIAGNOSIS — I951 Orthostatic hypotension: Secondary | ICD-10-CM | POA: Diagnosis present

## 2018-12-22 DIAGNOSIS — Z20828 Contact with and (suspected) exposure to other viral communicable diseases: Secondary | ICD-10-CM | POA: Diagnosis present

## 2018-12-22 DIAGNOSIS — F17210 Nicotine dependence, cigarettes, uncomplicated: Secondary | ICD-10-CM | POA: Diagnosis present

## 2018-12-22 DIAGNOSIS — Z888 Allergy status to other drugs, medicaments and biological substances status: Secondary | ICD-10-CM

## 2018-12-22 DIAGNOSIS — Z881 Allergy status to other antibiotic agents status: Secondary | ICD-10-CM

## 2018-12-22 DIAGNOSIS — Z9221 Personal history of antineoplastic chemotherapy: Secondary | ICD-10-CM

## 2018-12-22 DIAGNOSIS — Z79899 Other long term (current) drug therapy: Secondary | ICD-10-CM

## 2018-12-22 DIAGNOSIS — I255 Ischemic cardiomyopathy: Secondary | ICD-10-CM | POA: Diagnosis present

## 2018-12-22 DIAGNOSIS — I509 Heart failure, unspecified: Secondary | ICD-10-CM | POA: Diagnosis present

## 2018-12-22 DIAGNOSIS — Z8673 Personal history of transient ischemic attack (TIA), and cerebral infarction without residual deficits: Secondary | ICD-10-CM

## 2018-12-22 DIAGNOSIS — I4460 Unspecified fascicular block: Secondary | ICD-10-CM | POA: Diagnosis present

## 2018-12-22 DIAGNOSIS — Z95810 Presence of automatic (implantable) cardiac defibrillator: Secondary | ICD-10-CM

## 2018-12-22 DIAGNOSIS — J449 Chronic obstructive pulmonary disease, unspecified: Secondary | ICD-10-CM | POA: Diagnosis present

## 2018-12-22 MED ORDER — DILTIAZEM LOAD VIA INFUSION
10.0000 mg | Freq: Once | INTRAVENOUS | Status: AC
  Administered 2018-12-23: 10 mg via INTRAVENOUS
  Filled 2018-12-22: qty 10

## 2018-12-22 MED ORDER — DILTIAZEM HCL-DEXTROSE 125-5 MG/125ML-% IV SOLN (PREMIX)
5.0000 mg/h | INTRAVENOUS | Status: DC
  Filled 2018-12-22: qty 125

## 2018-12-22 NOTE — ED Notes (Signed)
Wife- darla [phone] would like a call if she can come up here

## 2018-12-22 NOTE — ED Triage Notes (Signed)
BIB [facility] from home with c/o of Afib RVR with reported defibrillator shocks X 6. Initial HR of 210bpm with EMS, received 351mL NS and 5mg  metoprolol decreasing HR to 128bpm on arrival.

## 2018-12-22 NOTE — ED Provider Notes (Signed)
[facility] EMERGENCY DEPARTMENT Provider Note   CSN: [id number] Arrival date & time: 12/22/18  2330     History   Chief Complaint Chief Complaint  Patient presents with  . Atrial Fibrillation  . Pacemaker Problem    HPI Brett Saunders is a 73 y.o. male.     HPI  This is a 73 year old male with a history of lung cancer, COPD, ischemic cardiomyopathy, peripheral vascular disease, and atrial fibrillation on Eliquis who presents after his defibrillator fired 6 times at home.  Patient reports that his defibrillator fired multiple times at home.  He believes it was approximately 6 times.  Upon EMS arrival he was noted to have a heart rate in the 210s.  He was given 1 dose of IV metoprolol and heart rate settled out into the 140s to 150s.  Patient reports that he had some shortness of breath and diaphoresis.  No chest pain.  Denies any recent fevers or cough.  Does report that he has an infection of his right foot and was started on antibiotics 2 days ago for the infection.  Patient reports that he takes his Eliquis daily.  Past Medical History:  Diagnosis Date  . Aortic anomaly    aortic calcification  . Arrhythmia    a fib  . Cancer ([place])    lung  . Chronic obstructive lung disease ([place])   . Hypertension   . Ischemic cardiomyopathy   . Peripheral vascular disease ([place])   . Stroke [facility])    oct 2012    There are no active problems to display for this patient.   Past Surgical History:  Procedure Laterality Date  . INSERT / REPLACE / REMOVE PACEMAKER     SSS        Home Medications    Prior to Admission medications   Medication Sig Start Date End Date Taking? Authorizing Provider  acetaminophen (TYLENOL) 500 MG tablet Take 500 mg by mouth 2 (two) times daily.    [provider]  amiodarone (PACERONE) 200 MG tablet Take 100 mg by mouth daily.     [provider]  apixaban (ELIQUIS) 5 MG TABS tablet Take 2.5 mg by mouth 2 (two)  times daily.    [provider]  azithromycin (ZITHROMAX Z-PAK) 250 MG tablet 2 po day one, then 1 daily x 4 days Patient not taking: Reported on 03/13/2018 12/06/17   Margarita Mail, PA-C  carvedilol (COREG) 3.125 MG tablet Take 3.125 mg by mouth 2 (two) times daily with a meal.    [provider]  diphenhydrAMINE (BENADRYL) 25 MG tablet Take 25 mg by mouth at bedtime.    [provider]  furosemide (LASIX) 20 MG tablet Take 20 mg by mouth daily.     [provider]  Glycopyrrolate-Formoterol (BEVESPI AEROSPHERE) 9-4.8 MCG/ACT AERO Inhale 2 puffs into the lungs 2 (two) times daily.    [provider]  Liniments (SALONPAS PAIN RELIEF PATCH EX) Place 1 patch onto the skin daily as needed (pain).    [provider]  OLODATEROL HCL IN Inhale 2.5 mg into the lungs every morning. 2 puffs    [provider]  oxyCODONE-acetaminophen (PERCOCET) 5-325 MG tablet Take 1-2 tablets by mouth every 4 (four) hours as needed. Patient not taking: Reported on 03/13/2018 12/06/17   Margarita Mail, PA-C  rOPINIRole (REQUIP) 0.25 MG tablet Take 0.25 mg by mouth at bedtime.     [provider]  sodium chloride 1 g  tablet Take 1 g by mouth 3 (three) times daily.    [provider]    Family History History reviewed. No pertinent family history.  Social History Social History   Tobacco Use  . Smoking status: Light Tobacco Smoker    Packs/day: 0.10    Types: Cigarettes  . Smokeless tobacco: Never Used  . Tobacco comment: pt smokes 1 cigarrete occasionally. 1 pack "last a few months"  Substance Use Topics  . Alcohol use: Not on file  . Drug use: Not on file     Allergies   Amoxicillin and Avelox [moxifloxacin hcl]   Review of Systems Review of Systems  Constitutional: Negative for fever.  Respiratory: Positive for shortness of breath. Negative for cough.   Cardiovascular: Negative for chest pain.  Gastrointestinal:  Negative for abdominal pain, nausea and vomiting.  Genitourinary: Negative for dysuria.  Musculoskeletal:       Foot pain  Skin: Positive for color change.  All other systems reviewed and are negative.    Physical Exam Updated Vital Signs BP 104/62   Pulse (!) 135   Resp (!) 21   Ht 1.778 m (5\' 10" )   Wt 52.2 kg   SpO2 97%   BMI 16.50 kg/m   Physical Exam Vitals signs and nursing note reviewed.  Constitutional:      Appearance: He is well-developed.     Comments: Chronically ill-appearing, thin  HENT:     Head: Normocephalic and atraumatic.  Eyes:     Pupils: Pupils are equal, round, and reactive to light.  Neck:     Musculoskeletal: Neck supple.  Cardiovascular:     Rate and Rhythm: Tachycardia present. Rhythm irregular.     Heart sounds: Normal heart sounds. No murmur.  Pulmonary:     Effort: Pulmonary effort is normal. No respiratory distress.     Breath sounds: Normal breath sounds. No wheezing.  Abdominal:     General: Bowel sounds are normal.     Palpations: Abdomen is soft.     Tenderness: There is no abdominal tenderness. There is no rebound.  Musculoskeletal:     Right lower leg: No edema.     Left lower leg: No edema.  Lymphadenopathy:     Cervical: No cervical adenopathy.  Skin:    General: Skin is warm and dry.     Comments: Erythema and darkening of the skin over the plantar aspect of the foot between the fourth and fifth digits, unable to palpate DP pulses bilaterally  Neurological:     Mental Status: He is alert and oriented to person, place, and time.  Psychiatric:        Mood and Affect: Mood normal.      ED Treatments / Results  Labs (all labs ordered are listed, but only abnormal results are displayed) Labs Reviewed  BASIC METABOLIC PANEL - Abnormal; Notable for the following components:      Result Value   Glucose, Bld 164 (*)    Creatinine, Ser 1.60 (*)    Calcium 8.1 (*)    GFR calc non Af Amer 42 (*)    GFR calc Af Amer 48 (*)     All other components within normal limits  CBC - Abnormal; Notable for the following components:   RBC 3.72 (*)    Hemoglobin 12.1 (*)    HCT 36.6 (*)    Platelets 148 (*)    All other components within normal limits  TROPONIN I (HIGH SENSITIVITY) - Abnormal;  Notable for the following components:   Troponin I (High Sensitivity) 48 (*)    All other components within normal limits  SARS CORONAVIRUS 2 (TAT 6-24 HRS)  MAGNESIUM  BASIC METABOLIC PANEL    EKG EKG Interpretation  Date/Time:  Wednesday December 23 2018 00:02:28 EDT Ventricular Rate:  135 PR Interval:    QRS Duration: 117 QT Interval:  333 QTC Calculation: 481 R Axis:   -69 Text Interpretation:  Atrial fibrillation Left anterior fascicular block Anteroseptal infarct, age indeterminate ST elevation, consider inferior injury likely rate related A fib new compared to previous EKG Confirmed by Pryor Curia [phone]) on 12/23/2018 12:05:37 AM   Radiology Dg Chest Port 1 View  Result Date: 12/23/2018 CLINICAL DATA:  Shortness of breath EXAM: PORTABLE CHEST 1 VIEW COMPARISON:  12/06/2017 FINDINGS: Unchanged position of left chest wall AICD leads. Lungs are hyperinflated with diffuse interstitial coarsening. Normal cardiomediastinal contours. No focal airspace consolidation or pulmonary edema. IMPRESSION: 1. Unchanged appearance of left chest wall AICD. 2. COPD without focal airspace disease. Electronically Signed   By: Ulyses Jarred M.D.   On: 12/23/2018 00:15   Dg Foot Complete Right  Result Date: 12/23/2018 CLINICAL DATA:  Right little toe wound EXAM: RIGHT FOOT COMPLETE - 3+ VIEW COMPARISON:  None. FINDINGS: There is no evidence of fracture or dislocation. There is no evidence of arthropathy or other focal bone abnormality. Mild soft tissue swelling at the little toe. No ulceration visible. IMPRESSION: No evidence of osteomyelitis or other acute abnormality of the right foot. Electronically Signed   By: Ulyses Jarred M.D.   On:  12/23/2018 00:20    Procedures Procedures (including critical care time)  CRITICAL CARE Performed by: Merryl Hacker   Total critical care time: 40 minutes  Critical care time was exclusive of separately billable procedures and treating other patients.  Critical care was necessary to treat or prevent imminent or life-threatening deterioration.  Critical care was time spent personally by me on the following activities: development of treatment plan with patient and/or surrogate as well as nursing, discussions with consultants, evaluation of patient's response to treatment, examination of patient, obtaining history from patient or surrogate, ordering and performing treatments and interventions, ordering and review of laboratory studies, ordering and review of radiographic studies, pulse oximetry and re-evaluation of patient's condition.   Medications Ordered in ED Medications  amiodarone (NEXTERONE PREMIX) 360-4.14 MG/200ML-% (1.8 mg/mL) IV infusion (60 mg/hr Intravenous New Bag/Given 12/23/18 0116)  amiodarone (NEXTERONE PREMIX) 360-4.14 MG/200ML-% (1.8 mg/mL) IV infusion (has no administration in time range)  apixaban (ELIQUIS) tablet 2.5 mg (has no administration in time range)  diltiazem (CARDIZEM) 1 mg/mL load via infusion 10 mg (10 mg Intravenous Bolus from Bag 12/23/18 0003)  amiodarone (NEXTERONE) 1.8 mg/mL load via infusion 150 mg (150 mg Intravenous Bolus from Bag 12/23/18 0117)     Initial Impression / Assessment and Plan / ED Course  I have reviewed the triage vital signs and the nursing notes.  Pertinent labs & imaging results that were available during my care of the patient were reviewed by me and considered in my medical decision making (see chart for details).  Clinical Course as of Dec 23 134  Wed Dec 23, 2018  0019 Per verbal report from [employer], patient did receive 6 shocks for achycardia with rates greater than 180.  Patient received 110 mg bolus of  diltiazem.  Blood pressure 94 systolic.  Patient likely will need synced cardioversion.   [CH]  0029 [employer]  report reviewed.  Patient had 6 total defibrillations for V. tach/V. fib.  5 failed.   [CH]  0121 Spoke with Dr. Einar Gip.  He recommends amiodarone given patient's soft blood pressures.  Recommends holding off on cardioversion given that patient did not convert with defibrillation.  Have ordered Eliquis for the morning and a repeat BMP.  Dr. Einar Gip to admit.   [CH]    Clinical Course User Index [CH] Burrell Hodapp, Barbette Hair, MD      Patient presents after defibrillator firing.  Noted to be significant tachycardia by EMS.  He is overall nontoxic-appearing.  Vital signs notable for tachycardia.  He currently appears to be in atrial fibrillation with RVR.  Blood pressures are soft.  He did receive 1 dose of metoprolol 1 dose of diltiazem but repeat blood pressures in the mid 90s.  He reports taking his Eliquis daily.  [employer] confirms V. fib/V. tach with multiple shocks.  Patient was discussed with cardiology as above.  Recommend starting amiodarone to see how the patient does.  Defer cardioversion at this time.  Patient's morning Eliquis and BMP were ordered per Dr. Einar Gip.  Final Clinical Impressions(s) / ED Diagnoses   Final diagnoses:  Atrial fibrillation with RVR [facility])  Ventricular tachycardia [facility])    ED Discharge Orders         Ordered    Amb referral to AFIB Clinic     12/22/18 2345           Joia Doyle, Barbette Hair, MD 12/23/18 [phone]

## 2018-12-23 ENCOUNTER — Emergency Department (HOSPITAL_COMMUNITY): Payer: No Typology Code available for payment source

## 2018-12-23 DIAGNOSIS — I428 Other cardiomyopathies: Secondary | ICD-10-CM | POA: Diagnosis present

## 2018-12-23 DIAGNOSIS — I998 Other disorder of circulatory system: Secondary | ICD-10-CM | POA: Diagnosis present

## 2018-12-23 DIAGNOSIS — I255 Ischemic cardiomyopathy: Secondary | ICD-10-CM | POA: Diagnosis present

## 2018-12-23 DIAGNOSIS — Z8673 Personal history of transient ischemic attack (TIA), and cerebral infarction without residual deficits: Secondary | ICD-10-CM | POA: Diagnosis not present

## 2018-12-23 DIAGNOSIS — N183 Chronic kidney disease, stage 3 unspecified: Secondary | ICD-10-CM | POA: Diagnosis present

## 2018-12-23 DIAGNOSIS — Z95810 Presence of automatic (implantable) cardiac defibrillator: Secondary | ICD-10-CM | POA: Diagnosis not present

## 2018-12-23 DIAGNOSIS — I130 Hypertensive heart and chronic kidney disease with heart failure and stage 1 through stage 4 chronic kidney disease, or unspecified chronic kidney disease: Secondary | ICD-10-CM | POA: Diagnosis present

## 2018-12-23 DIAGNOSIS — Z85118 Personal history of other malignant neoplasm of bronchus and lung: Secondary | ICD-10-CM | POA: Diagnosis not present

## 2018-12-23 DIAGNOSIS — Z9221 Personal history of antineoplastic chemotherapy: Secondary | ICD-10-CM | POA: Diagnosis not present

## 2018-12-23 DIAGNOSIS — I509 Heart failure, unspecified: Secondary | ICD-10-CM | POA: Diagnosis present

## 2018-12-23 DIAGNOSIS — Z79899 Other long term (current) drug therapy: Secondary | ICD-10-CM | POA: Diagnosis not present

## 2018-12-23 DIAGNOSIS — J449 Chronic obstructive pulmonary disease, unspecified: Secondary | ICD-10-CM | POA: Diagnosis present

## 2018-12-23 DIAGNOSIS — I4460 Unspecified fascicular block: Secondary | ICD-10-CM | POA: Diagnosis present

## 2018-12-23 DIAGNOSIS — Z923 Personal history of irradiation: Secondary | ICD-10-CM | POA: Diagnosis not present

## 2018-12-23 DIAGNOSIS — Z4502 Encounter for adjustment and management of automatic implantable cardiac defibrillator: Secondary | ICD-10-CM | POA: Diagnosis not present

## 2018-12-23 DIAGNOSIS — I739 Peripheral vascular disease, unspecified: Secondary | ICD-10-CM | POA: Diagnosis present

## 2018-12-23 DIAGNOSIS — I480 Paroxysmal atrial fibrillation: Principal | ICD-10-CM

## 2018-12-23 DIAGNOSIS — Z7901 Long term (current) use of anticoagulants: Secondary | ICD-10-CM | POA: Diagnosis not present

## 2018-12-23 DIAGNOSIS — E785 Hyperlipidemia, unspecified: Secondary | ICD-10-CM | POA: Diagnosis present

## 2018-12-23 DIAGNOSIS — Z20828 Contact with and (suspected) exposure to other viral communicable diseases: Secondary | ICD-10-CM | POA: Diagnosis present

## 2018-12-23 DIAGNOSIS — F4310 Post-traumatic stress disorder, unspecified: Secondary | ICD-10-CM | POA: Diagnosis present

## 2018-12-23 DIAGNOSIS — R0602 Shortness of breath: Secondary | ICD-10-CM | POA: Diagnosis present

## 2018-12-23 DIAGNOSIS — I951 Orthostatic hypotension: Secondary | ICD-10-CM | POA: Diagnosis present

## 2018-12-23 DIAGNOSIS — Z881 Allergy status to other antibiotic agents status: Secondary | ICD-10-CM | POA: Diagnosis not present

## 2018-12-23 DIAGNOSIS — L97519 Non-pressure chronic ulcer of other part of right foot with unspecified severity: Secondary | ICD-10-CM | POA: Diagnosis present

## 2018-12-23 DIAGNOSIS — F17210 Nicotine dependence, cigarettes, uncomplicated: Secondary | ICD-10-CM | POA: Diagnosis present

## 2018-12-23 LAB — BASIC METABOLIC PANEL
Anion gap: 10 (ref 5–15)
Anion gap: 11 (ref 5–15)
BUN: 18 mg/dL (ref 8–23)
BUN: 18 mg/dL (ref 8–23)
CO2: 23 mmol/L (ref 22–32)
CO2: 23 mmol/L (ref 22–32)
Calcium: 8.1 mg/dL — ABNORMAL LOW (ref 8.9–10.3)
Calcium: 8.4 mg/dL — ABNORMAL LOW (ref 8.9–10.3)
Chloride: 103 mmol/L (ref 98–111)
Chloride: 104 mmol/L (ref 98–111)
Creatinine, Ser: 1.34 mg/dL — ABNORMAL HIGH (ref 0.61–1.24)
Creatinine, Ser: 1.6 mg/dL — ABNORMAL HIGH (ref 0.61–1.24)
GFR calc Af Amer: 48 mL/min — ABNORMAL LOW (ref >60)
GFR calc Af Amer: 60 mL/min — ABNORMAL LOW (ref >60)
GFR calc non Af Amer: 42 mL/min — ABNORMAL LOW (ref >60)
GFR calc non Af Amer: 51 mL/min — ABNORMAL LOW (ref >60)
Glucose, Bld: 103 mg/dL — ABNORMAL HIGH (ref 70–99)
Glucose, Bld: 164 mg/dL — ABNORMAL HIGH (ref 70–99)
Potassium: 3.6 mmol/L (ref 3.5–5.1)
Potassium: 3.7 mmol/L (ref 3.5–5.1)
Sodium: 137 mmol/L (ref 135–145)
Sodium: 137 mmol/L (ref 135–145)

## 2018-12-23 LAB — HEPATIC FUNCTION PANEL
ALT: 20 U/L (ref 0–44)
AST: 36 U/L (ref 15–41)
Albumin: 3 g/dL — ABNORMAL LOW (ref 3.5–5.0)
Alkaline Phosphatase: 69 U/L (ref 38–126)
Bilirubin, Direct: <0.1 mg/dL (ref 0.0–0.2)
Total Bilirubin: 0.6 mg/dL (ref 0.3–1.2)
Total Protein: 6.4 g/dL — ABNORMAL LOW (ref 6.5–8.1)

## 2018-12-23 LAB — CBC
HCT: 36.6 % — ABNORMAL LOW (ref 39.0–52.0)
Hemoglobin: 12.1 g/dL — ABNORMAL LOW (ref 13.0–17.0)
MCH: 32.5 pg (ref 26.0–34.0)
MCHC: 33.1 g/dL (ref 30.0–36.0)
MCV: 98.4 fL (ref 80.0–100.0)
Platelets: 148 10*3/uL — ABNORMAL LOW (ref 150–400)
RBC: 3.72 MIL/uL — ABNORMAL LOW (ref 4.22–5.81)
RDW: 14.4 % (ref 11.5–15.5)
WBC: 9.7 10*3/uL (ref 4.0–10.5)
nRBC: 0 % (ref 0.0–0.2)

## 2018-12-23 LAB — ECHOCARDIOGRAM COMPLETE
Height: 70 in
Weight: 1840 oz

## 2018-12-23 LAB — MAGNESIUM: Magnesium: 1.7 mg/dL (ref 1.7–2.4)

## 2018-12-23 LAB — TROPONIN I (HIGH SENSITIVITY)
Troponin I (High Sensitivity): 142 ng/L (ref <18)
Troponin I (High Sensitivity): 48 ng/L — ABNORMAL HIGH (ref <18)

## 2018-12-23 LAB — TSH: TSH: 2.678 u[IU]/mL (ref 0.350–4.500)

## 2018-12-23 LAB — T4, FREE: Free T4: 0.92 ng/dL (ref 0.61–1.12)

## 2018-12-23 LAB — SARS CORONAVIRUS 2 (TAT 6-24 HRS): SARS Coronavirus 2: NEGATIVE

## 2018-12-23 MED ORDER — AMIODARONE HCL 200 MG PO TABS: 200.0000 mg | ORAL_TABLET | Freq: Every day | ORAL | 1 refills | Status: DC

## 2018-12-23 MED ORDER — AMIODARONE LOAD VIA INFUSION
150.0000 mg | Freq: Once | INTRAVENOUS | Status: AC
  Administered 2018-12-23: 01:00:00 150 mg via INTRAVENOUS
  Filled 2018-12-23: qty 83.34

## 2018-12-23 MED ORDER — AMIODARONE HCL IN DEXTROSE 360-4.14 MG/200ML-% IV SOLN
60.0000 mg/h | INTRAVENOUS | Status: AC
  Administered 2018-12-23: 60 mg/h via INTRAVENOUS
  Filled 2018-12-23: qty 200

## 2018-12-23 MED ORDER — AMIODARONE HCL IN DEXTROSE 360-4.14 MG/200ML-% IV SOLN
30.0000 mg/h | INTRAVENOUS | Status: AC
  Administered 2018-12-23: 04:00:00 30 mg/h via INTRAVENOUS
  Filled 2018-12-23: qty 200

## 2018-12-23 MED ORDER — FUROSEMIDE 20 MG PO TABS: 20.0000 mg | ORAL_TABLET | Freq: Every day | ORAL | Status: DC

## 2018-12-23 MED ORDER — CARVEDILOL 3.125 MG PO TABS
3.1250 mg | ORAL_TABLET | Freq: Two times a day (BID) | ORAL | Status: DC
  Filled 2018-12-23 (×2): qty 1

## 2018-12-23 MED ORDER — ONDANSETRON HCL 4 MG/2ML IJ SOLN: 4.0000 mg | Freq: Four times a day (QID) | INTRAMUSCULAR | Status: DC | PRN

## 2018-12-23 MED ORDER — POTASSIUM CHLORIDE ER 10 MEQ PO TBCR
10.0000 meq | EXTENDED_RELEASE_TABLET | Freq: Two times a day (BID) | ORAL | Status: DC
  Administered 2018-12-23: 10 meq via ORAL
  Filled 2018-12-23 (×2): qty 1

## 2018-12-23 MED ORDER — ROPINIROLE HCL 0.25 MG PO TABS: 0.2500 mg | ORAL_TABLET | Freq: Every day | ORAL | Status: DC

## 2018-12-23 MED ORDER — APIXABAN 2.5 MG PO TABS
2.5000 mg | ORAL_TABLET | Freq: Two times a day (BID) | ORAL | Status: DC
  Administered 2018-12-23 (×2): 2.5 mg via ORAL
  Filled 2018-12-23 (×3): qty 1

## 2018-12-23 MED ORDER — OLODATEROL HCL 2.5 MCG/ACT IN AERS: 2.5000 mg | INHALATION_SPRAY | RESPIRATORY_TRACT | Status: DC

## 2018-12-23 MED ORDER — AMIODARONE HCL 200 MG PO TABS
400.0000 mg | ORAL_TABLET | Freq: Two times a day (BID) | ORAL | Status: DC
  Administered 2018-12-23: 13:00:00 400 mg via ORAL
  Filled 2018-12-23: qty 2

## 2018-12-23 MED ORDER — AMIODARONE HCL 200 MG PO TABS: 400.0000 mg | ORAL_TABLET | Freq: Two times a day (BID) | ORAL | 1 refills | Status: DC

## 2018-12-23 MED ORDER — POTASSIUM CHLORIDE ER 10 MEQ PO TBCR: 10.0000 meq | EXTENDED_RELEASE_TABLET | Freq: Every day | ORAL | 1 refills | Status: DC

## 2018-12-23 NOTE — Discharge Summary (Signed)
Physician Discharge Summary  Patient ID: Brett Saunders MRN: [id number] DOB/AGE: 08-12-1945 73 y.o.  Admit date: 12/22/2018 Discharge date: 12/23/2018  Primary Discharge Diagnosis 1.  Recurrent ICD discharge secondary to atrial fibrillation with rapid ventricular response, inappropriate ICD discharge. Paroxysmal atrial fibrillation. CHA2DS2-VASc Score is 7.  Yearly risk of stroke: 9.8%. (Age >75, stroke, HTN, CHF, Vasc Dz).    2. 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 3.  Chronic orthostatic hypotension 4.  Orthostatic hypotension, presently doing well on salt supplements  5.  Critical limb ischemia involving the right leg, has a ischemic right fourth toe. 6.  History of CVA 7.  Hyperlipidemia 8.  COPD with centrilobular emphysema, history of lung cancer S/P chemotherapy and radiation therapy in 2013 in remission.  Non-small cell carcinoma.  Significant Diagnostic Studies: Echocardiogram 12/23/18 : 1. Left ventricular ejection fraction, by visual estimation, is 50 to 55%. The left ventricle has normal function. Normal left ventricular size. Left ventricular septal wall thickness was normal. There is no left ventricular hypertrophy.  2. Abnormal septal motion     Abnormal GLS -12.3.  3. Global right ventricle has normal systolic function.The right ventricular size is normal. No increase in right ventricular wall thickness.  4. Left atrial size was normal.  5. Right atrial size was normal.  6. The mitral valve is normal in structure. Trace mitral valve regurgitation. No evidence of mitral stenosis.  7. The tricuspid valve is normal in structure. Tricuspid valve regurgitation was not visualized by color flow Doppler.  8. The aortic valve is normal in structure. Aortic valve regurgitation was not  visualized by color flow Doppler. Structurally normal aortic valve, with no evidence of sclerosis or stenosis.  9. The pulmonic valve was normal in structure. Pulmonic valve regurgitation is not visualized by color flow Doppler. 10. Moderately elevated pulmonary artery systolic pressure. 11. The inferior vena cava is normal in size with greater than 50% respiratory variability, suggesting right atrial pressure of 3 mmHg. 12. Pacing wires in RA/RV.  EKG 12/23/2018: Atrial fibrillation with rapid ventricular response at the rate of 135 bpm, left axis deviation, left plantar fascicular block. IVCD, borderline criteria for LVH.  Anteroseptal infarct old.  Frequent PVCs.  Nonspecific T abnormality.  Compared to 12/06/2017, atrial fibrillation with RVR is new.  Otherwise marked ST depression with T wave inversion noted in the inferior leads and anterolateral leads is no longer present.  Consultation:  Dr. Virl Axe 12/23/18  The patient was shocked multiple times for rapid atrial fibrillation.  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  All Therapies were exhausted by the AFib  He has been able to maintain.  On amiodarone, even at a low dose.  For now, preventing him from recurrent atrial fibrillation and controlling rates if he recurs would be the primary therapy.  Hence, we will load him on amiodarone 400 twice daily x2 weeks 400 a day x2 weeks then 200 mg a day.  Orthostatic hypotension is a problem.  Nonpharmacological therapies are probably most appropriate including thigh sleeves and an abdominal binder.  Medications including Mestinon and her pro-Amatine might be of benefit  We will follow with you.  Thank you for  the consult  Hospital Course:   Caucasian male with history of lung cancer cigarette smoking, COPD, S/P chemotherapy in  2013, recently relocated from [place] to [place], [place] in 2019 moved to [place], history of stroke in 2013 when he presented with dysarthria, atrial fibrillation, non-ischemic cardiomyopathy, peripheral arterial disease with claudication involving left leg, admitted to the hospital with recurrent ICD discharge 6.  Upon presentation to the hospital/EMS, heart rate was 210 bpm and white complex rhythm.  Patient also reported associated shortness breath, diaphoresis.  On further questioning, recently he had developed right foot ulceration and was started with Bactrim DS 2 days ago. It also appears he may have received Avelox??  This morning he feels well.  He has chronic dyspnea, his main complaint today is severe pain in bilateral calves.  He has developed ulceration in his right fourth toe.  EP consultation was obtained, patient was started on IV amiodarone.  He converted to sinus rhythm on IV amiodarone EP consultation was obtained.  His recommendations from Dr. Caryl Comes is as above.  He was being admitted to the hospital for further observation and follow-up, but patient was about to leave AMA hence after discussions with Dr. Caryl Comes I decided to send the patient home.  His amiodarone dosage was changed.  Rest of the medications were kept as before as he has done well over the past 7 to 8 years.  Recommendations on discharge: Patient needs continued anticoagulation, more importantly he has critical limb ischemia and severe symptoms of claudication, he also needs peripheral arteriogram on an urgent basis.  Patient states that he is unable to make it through the next week due to personal reasons, advised him that there is potential of losing limb.  He is aware of this, for now I will continue with Eliquis along with trental that was prescribed by vascular surgeon at [facility].  He also needs close monitoring of his ICD and cardiac issues, fortunately his EF is improved.  I have offered him to  follow-up on cardiac and vascular issues if he so chooses.  After discussions, patient wants me to go ahead and schedule arteriogram, I plan to do this on 01/05/2019 unless he is willing to come earlier I can do it at any day as he does have critical limb ischemia.  He also needs high intensity statins, I did not make any changes as I am not sure whether he wants to continue to follow-up with the [facility] hospital system or to follow-up locally here with [place].  Discharge Exam: Blood pressure 108/67, pulse 75, temperature 98.5 F (36.9 C), temperature source Oral, resp. rate (!) 21, height 5\' 10"  (1.778 m), weight 52.2 kg, SpO2 99 %.  Physical Exam  Constitutional:  He is well developed and asthenic, no acute distress.  HENT:  Head: Atraumatic.  Eyes: Conjunctivae are normal.  Neck: Neck supple. No JVD present. No thyromegaly present.  Cardiovascular: Normal rate, regular rhythm, normal heart sounds and intact distal pulses. Exam reveals no gallop.  No murmur heard. Pulses:      Carotid pulses are 2+ on the right side with bruit and 2+ on the left side with bruit.      Radial pulses are 2+ on the right side and 2+ on the left side.       Femoral pulses are 2+ on the right side and 2+ on the left side.      Popliteal pulses are 0 on the right side and 0 on the left  side.       Dorsalis pedis pulses are 0 on the right side and 0 on the left side.       Posterior tibial pulses are 0 on the right side and 0 on the left side.  No leg edema.  Chronic ischemic change with hair loss noted bilateral lower extremity. There is ischemic ulcer right 4th toe at the base (dry gangrene).   Pulmonary/Chest: Effort normal and breath sounds normal.  Abdominal: Soft. Bowel sounds are normal.  Musculoskeletal: Normal range of motion.  Neurological: He is alert.  Skin: Skin is warm and dry.  Psychiatric: He has a normal mood and affect.    Labs:   Lab Results  Component Value Date   WBC 9.7 12/22/2018   HGB  12.1 (L) 12/22/2018   HCT 36.6 (L) 12/22/2018   MCV 98.4 12/22/2018   PLT 148 (L) 12/22/2018    Recent Labs  Lab 12/23/18 0448 12/23/18 1005  NA 137  --   K 3.7  --   CL 103  --   CO2 23  --   BUN 18  --   CREATININE 1.34*  --   CALCIUM 8.4*  --   PROT  --  6.4*  BILITOT  --  0.6  ALKPHOS  --  69  ALT  --  20  AST  --  36  GLUCOSE 103*  --     Lipid Panel  No results found for: CHOL, TRIG, HDL, CHOLHDL, VLDL, LDLCALC  BNP (last 3 results) No results for input(s): BNP in the last 8760 hours.  HEMOGLOBIN A1C No results found for: HGBA1C, MPG  Cardiac Panel (last 3 results) No results for input(s): CKTOTAL, CKMB, TROPONINI, RELINDX in the last 8760 hours.  No results found for: CKTOTAL, CKMB, CKMBINDEX, TROPONINI   TSH Recent Labs    12/23/18 1005  TSH 2.678   Radiology: Dg Chest Port 1 View  Result Date: 12/23/2018 CLINICAL DATA:  Shortness of breath EXAM: PORTABLE CHEST 1 VIEW COMPARISON:  12/06/2017 FINDINGS: Unchanged position of left chest wall AICD leads. Lungs are hyperinflated with diffuse interstitial coarsening. Normal cardiomediastinal contours. No focal airspace consolidation or pulmonary edema. IMPRESSION: 1. Unchanged appearance of left chest wall AICD. 2. COPD without focal airspace disease. Electronically Signed   By: Ulyses Jarred M.D.   On: 12/23/2018 00:15   Dg Foot Complete Right  Result Date: 12/23/2018 CLINICAL DATA:  Right little toe wound EXAM: RIGHT FOOT COMPLETE - 3+ VIEW COMPARISON:  None. FINDINGS: There is no evidence of fracture or dislocation. There is no evidence of arthropathy or other focal bone abnormality. Mild soft tissue swelling at the little toe. No ulceration visible. IMPRESSION: No evidence of osteomyelitis or other acute abnormality of the right foot. Electronically Signed   By: Ulyses Jarred M.D.   On: 12/23/2018 00:20   FOLLOW UP PLANS AND APPOINTMENTS  Allergies as of 12/23/2018      Reactions   Amoxicillin Shortness  Of Breath   Has patient had a PCN reaction causing immediate rash, facial/tongue/throat swelling, SOB or lightheadedness with hypotension: Yes Has patient had a PCN reaction causing severe rash involving mucus membranes or skin necrosis: Yes Has patient had a PCN reaction that required hospitalization: Yes Has patient had a PCN reaction occurring within the last 10 years: No If all of the above answers are "NO", then may proceed with Cephalosporin use.   Avelox [moxifloxacin Hcl] Other (See Comments)   confusion  Medication List    STOP taking these medications   azithromycin 250 MG tablet Commonly known as: Zithromax Z-Pak   oxyCODONE-acetaminophen 5-325 MG tablet Commonly known as: Percocet     TAKE these medications   acetaminophen 500 MG tablet Commonly known as: TYLENOL Take 500 mg by mouth 2 (two) times daily.   amiodarone 200 MG tablet Commonly known as: PACERONE Take 1 tablet (200 mg total) by mouth daily. What changed: how much to take   Bevespi Aerosphere 9-4.8 MCG/ACT Aero Generic drug: Glycopyrrolate-Formoterol Inhale 2 puffs into the lungs 2 (two) times daily.   carvedilol 3.125 MG tablet Commonly known as: COREG Take 3.125 mg by mouth 2 (two) times daily with a meal.   diphenhydrAMINE 25 MG tablet Commonly known as: BENADRYL Take 25 mg by mouth at bedtime.   Eliquis 5 MG Tabs tablet Generic drug: apixaban Take 2.5 mg by mouth 2 (two) times daily.   furosemide 20 MG tablet Commonly known as: LASIX Take 20 mg by mouth daily.   OLODATEROL HCL IN Inhale 2.5 mg into the lungs every morning. 2 puffs   pentoxifylline 400 MG CR tablet Commonly known as: TRENTAL Take 400 mg by mouth 2 (two) times daily.   potassium chloride 10 MEQ tablet Commonly known as: KLOR-CON Take 1 tablet (10 mEq total) by mouth daily.   rOPINIRole 0.25 MG tablet Commonly known as: REQUIP Take 0.25 mg by mouth at bedtime.   sodium chloride 1 g tablet Take 1 g by mouth  3 (three) times daily.   sulfamethoxazole-trimethoprim 800-160 MG tablet Commonly known as: BACTRIM DS Take 1 tablet by mouth 2 (two) times daily.      Follow-up Information    Adrian Prows, MD. Call.   Specialty: Cardiology Why: To be seen in 10 days. Our office will also reach out to you Contact information: [place] [address] [phone]           Adrian Prows, MD 12/23/2018, 4:37 PM  Pager: [phone] Office: [phone] If no answer: [phone]

## 2018-12-23 NOTE — Consult Note (Addendum)
ELECTROPHYSIOLOGY CONSULT NOTE    Patient ID: Brett Saunders MRN: [id number], DOB/AGE: 11/06/1945 73 y.o.  Admit date: 12/22/2018 Date of Consult: 12/23/2018  Primary Physician: System, Pcp Not In Primary Cardiologist: Followed at [facility] Electrophysiologist: Followed at [facility]  Referring Provider: Dr. Einar Gip  Patient Profile: Brett Saunders is a 73 y.o. male with a history of lung CA, tobacco abuse, COPD, h/o CVA, paroxysmal Afib, reports of ischemic CMP, peripheral arterial disease with left leg claudication who is being seen today for the evaluation of ICD shock x 6 at the request of Dr. Einar Gip.  HPI:  Brett Saunders is a 73 y.o. male who recently relocated from [place], [place] and is followed medically by the [place]. He was sitting at home last night in his recliner, when he felt his defibrillator go off. He initially thought he got shocked, as he was reaching for his cane and brushed against a laptop cord, but then had 5 further shocks.   He states he has felt "run down" over the past several days, but has no other specific symptoms. He denies chest pain, palpitations, syncope, dizziness, or lightheadedness leading up to his shocks. He reports a history of afib back to 2011; low burden by device interrogation looking back to 10/2017.  Past Medical History:  Diagnosis Date  . Aortic anomaly    aortic calcification  . Arrhythmia    a fib  . Cancer ([place])    lung  . Chronic obstructive lung disease ([place])   . Hypertension   . Ischemic cardiomyopathy   . Peripheral vascular disease ([place])   . Stroke [facility])    oct 2012     Surgical History:  Past Surgical History:  Procedure Laterality Date  . INSERT / REPLACE / REMOVE PACEMAKER     SSS     (Not in a hospital admission)   Inpatient Medications:  . apixaban  2.5 mg Oral BID    Allergies:  Allergies  Allergen Reactions  . Amoxicillin Shortness Of Breath    Has patient had a PCN reaction causing immediate rash, facial/tongue/throat  swelling, SOB or lightheadedness with hypotension: Yes Has patient had a PCN reaction causing severe rash involving mucus membranes or skin necrosis: Yes Has patient had a PCN reaction that required hospitalization: Yes Has patient had a PCN reaction occurring within the last 10 years: No If all of the above answers are "NO", then may proceed with Cephalosporin use.  . Avelox [Moxifloxacin Hcl] Other (See Comments)    confusion    Social History   Socioeconomic History  . Marital status: Married    Spouse name: Not on file  . Number of children: Not on file  . Years of education: Not on file  . Highest education level: Not on file  Occupational History  . Not on file  Social Needs  . Financial resource strain: Not on file  . Food insecurity    Worry: Not on file    Inability: Not on file  . Transportation needs    Medical: Not on file    Non-medical: Not on file  Tobacco Use  . Smoking status: Light Tobacco Smoker    Packs/day: 0.10    Types: Cigarettes  . Smokeless tobacco: Never Used  . Tobacco comment: pt smokes 1 cigarrete occasionally. 1 pack "last a few months"  Substance and Sexual Activity  . Alcohol use: Not on file  . Drug use: Not on file  . Sexual activity: Not on file  Lifestyle  . Physical activity    Days per week: Not on file    Minutes per session: Not on file  . Stress: Not on file  Relationships  . Social Herbalist on phone: Not on file    Gets together: Not on file    Attends religious service: Not on file    Active member of club or organization: Not on file    Attends meetings of clubs or organizations: Not on file    Relationship status: Not on file  . Intimate partner violence    Fear of current or ex partner: Not on file    Emotionally abused: Not on file    Physically abused: Not on file    Forced sexual activity: Not on file  Other Topics Concern  . Not on file  Social History Narrative  . Not on file     History  reviewed. No pertinent family history.   Review of Systems: All other systems reviewed and are otherwise negative except as noted above.  Physical Exam: Vitals:   12/23/18 0159 12/23/18 0200 12/23/18 0530 12/23/18 0600  BP:  (!) 116/58 (!) 99/50 (!) 95/57  Pulse: 87 85 73 74  Resp: 15 (!) 24 (!) 23 (!) 24  SpO2: 97% 98% 99% 100%  Weight:      Height:        GEN- The patient is elderly appearing, alert and oriented x 3 today.   HEENT: normocephalic, atraumatic; sclera clear, conjunctiva pink; hearing intact; oropharynx clear; neck supple Lungs- Clear to ausculation bilaterally, normal work of breathing.  No wheezes, rales, rhonchi Heart- Regular rate and rhythm, no murmurs, rubs or gallops GI- soft, non-tender, non-distended, bowel sounds present Extremities- no clubbing, cyanosis, or edema; DP/PT/radial pulses 2+ bilaterally MS- no significant deformity or atrophy Skin- warm and dry, no rash or lesion Psych- euthymic mood, full affect Neuro- strength and sensation are intact  Labs:   Lab Results  Component Value Date   WBC 9.7 12/22/2018   HGB 12.1 (L) 12/22/2018   HCT 36.6 (L) 12/22/2018   MCV 98.4 12/22/2018   PLT 148 (L) 12/22/2018    Recent Labs  Lab 12/23/18 0448  NA 137  K 3.7  CL 103  CO2 23  BUN 18  CREATININE 1.34*  CALCIUM 8.4*  GLUCOSE 103*      Radiology/Studies: Dg Chest Port 1 View  Result Date: 12/23/2018 CLINICAL DATA:  Shortness of breath EXAM: PORTABLE CHEST 1 VIEW COMPARISON:  12/06/2017 FINDINGS: Unchanged position of left chest wall AICD leads. Lungs are hyperinflated with diffuse interstitial coarsening. Normal cardiomediastinal contours. No focal airspace consolidation or pulmonary edema. IMPRESSION: 1. Unchanged appearance of left chest wall AICD. 2. COPD without focal airspace disease. Electronically Signed   By: Ulyses Jarred M.D.   On: 12/23/2018 00:15   Dg Foot Complete Right  Result Date: 12/23/2018 CLINICAL DATA:  Right little toe  wound EXAM: RIGHT FOOT COMPLETE - 3+ VIEW COMPARISON:  None. FINDINGS: There is no evidence of fracture or dislocation. There is no evidence of arthropathy or other focal bone abnormality. Mild soft tissue swelling at the little toe. No ulceration visible. IMPRESSION: No evidence of osteomyelitis or other acute abnormality of the right foot. Electronically Signed   By: Ulyses Jarred M.D.   On: 12/23/2018 00:20    EKG: Afib with RVR at 135 bpm, QRS 117 ms (personally reviewed)  TELEMETRY: NSR 70-80s currently, He did have atrial fibrillation in  the 120-130s overnight prior to amiodarone  (personally reviewed)  DEVICE HISTORY: Medtronic dual chamber ICD implanted 08/05/2012; unclear if for primary or secondary.  Assessment/Plan: 1.  Paroxysmal afib with RVR Reviewed with [employer] and Dr. Caryl Comes. Pt received "inappropriate" therapy due to Afib RVR that caused his ventricular rates to fall within his VF/treatment zone.  He is quiescent since last night on amiodarone gtt.  He was on amiodarone 100 mg daily at home; would re-load for home with a taper. Continue low dose coreg 3.125 mg BID We would recommend digoxin consideration, but he states he has been told not to ever take digoxin by an MD in 2013. This was prior to his ICD implantation, so could consider re-challenging.  2. Cardiomyopathy, reported as ischemic  He denies ischemic symptoms. He reports that his previous heart catheterizations did not show blockages, and denies history of stenting.   He reports his most recent EF was 30-35%, approx 18 months ago.  3. Chronic orthostatic hypotension Has limited his med titration.   For questions or updates, please contact [place] Please consult [email or web address] for contact info under Cardiology/STEMI.  Jacalyn Lefevre, PA-C  Pager: [phone]  12/23/2018 8:12 AM   AFib RVR  Cardiomyopathy presumed NONISCHEMIC  Orthostatic hypotension  Per Vas Disease  CVA   COPD  PTSD  Hx and PE as above   The patient was shocked multiple times for rapid atrial fibrillation.  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  All Therapies were exhausted by the AFib  He has been able to maintain sinus rhythm on amiodarone, even at a low dose.  For now, preventing him from recurrent atrial fibrillation and controlling rates if he recurs would be the primary therapy.  Hence, we will load him on amiodarone 400 twice daily x2 weeks 400 a day x2 weeks then 200 mg a day.  We will check his TSH.  Echocardiogram would be useful.  His last measured ejection fraction was about 30 to 35%.  Left atrial dimensions would inform options for ablative therapy.  His chart says he has ischemic heart disease; this would be quite likely with his peripheral vascular disease; however, he reports that catheterization a "found nothing "  Continue anticoagulation.  Orthostatic hypotension is a problem.  Nonpharmacological therapies are probably most appropriate including thigh sleeves and an abdominal binder.  Medications including Mestinon and her pro-Amatine might be of benefit  We will follow with you.  Thank you for the consult

## 2018-12-23 NOTE — H&P (Signed)
CARDIOLOGY ADMIT NOTE   Brett Saunders is an 73 y.o. male.    Chief Complaint  Patient presents with  . Atrial Fibrillation  . Pacemaker Problem      HPI: Brett Saunders  is a 73 y.o. Caucasian male with history of lung cancer cigarette smoking, COPD, S/P chemotherapy in 2013, recently relocated from [place] to [place], [place] in 2019 moved to [place], history of stroke in 2013 when he presented with dysarthria, atrial fibrillation, non-ischemic cardiomyopathy, peripheral arterial disease with claudication involving left leg, admitted to the hospital with recurrent ICD discharge 6.  Upon presentation to the hospital/EMS, heart rate was 210 bpm and white complex rhythm.  Patient also reported associated shortness breath, diaphoresis.  On further questioning, recently he had developed right foot ulceration and was started with Bactrim DS 2 days ago. It also appears he may have received Avelox??  This morning he feels well.  He has chronic dyspnea, his main complaint today is severe pain in bilateral calves.  He has developed ulceration in his right fourth toe.  Past Medical History:  Diagnosis Date  . Aortic anomaly    aortic calcification  . Arrhythmia    a fib  . Cancer ([place])    lung  . Chronic obstructive lung disease ([place])   . Hypertension   . Ischemic cardiomyopathy   . Peripheral vascular disease ([place])   . Stroke [facility])    oct 2012    Past Surgical History:  Procedure Laterality Date  . INSERT / REPLACE / REMOVE PACEMAKER     SSS    Social History   Socioeconomic History  . Marital status: Married    Spouse name: Not on file  . Number of children: Not on file  . Years of education: Not on file  . Highest education level: Not on file  Occupational History  . Not on file  Social Needs  . Financial resource strain: Not on file  . Food insecurity    Worry: Not on file    Inability: Not on file  . Transportation needs    Medical: Not on file     Non-medical: Not on file  Tobacco Use  . Smoking status: Light Tobacco Smoker    Packs/day: 0.10    Types: Cigarettes  . Smokeless tobacco: Never Used  . Tobacco comment: pt smokes 1 cigarrete occasionally. 1 pack "last a few months"  Substance and Sexual Activity  . Alcohol use: Not on file  . Drug use: Not on file  . Sexual activity: Not on file  Lifestyle  . Physical activity    Days per week: Not on file    Minutes per session: Not on file  . Stress: Not on file  Relationships  . Social Herbalist on phone: Not on file    Gets together: Not on file    Attends religious service: Not on file    Active member of club or organization: Not on file    Attends meetings of clubs or organizations: Not on file    Relationship status: Not on file  . Intimate partner violence    Fear of current or ex partner: Not on file    Emotionally abused: Not on file    Physically abused: Not on file    Forced sexual activity: Not on file  Other Topics Concern  . Not on file  Social History Narrative  . Not on file    No current facility-administered medications  on file prior to encounter.    Current Outpatient Medications on File Prior to Encounter  Medication Sig Dispense Refill  . acetaminophen (TYLENOL) 500 MG tablet Take 500 mg by mouth 2 (two) times daily.    Marland Kitchen amiodarone (PACERONE) 200 MG tablet Take 100 mg by mouth daily.     Marland Kitchen apixaban (ELIQUIS) 5 MG TABS tablet Take 2.5 mg by mouth 2 (two) times daily.    . carvedilol (COREG) 3.125 MG tablet Take 3.125 mg by mouth 2 (two) times daily with a meal.    . diphenhydrAMINE (BENADRYL) 25 MG tablet Take 25 mg by mouth at bedtime.    . furosemide (LASIX) 20 MG tablet Take 20 mg by mouth daily.     . Glycopyrrolate-Formoterol (BEVESPI AEROSPHERE) 9-4.8 MCG/ACT AERO Inhale 2 puffs into the lungs 2 (two) times daily.    . OLODATEROL HCL IN Inhale 2.5 mg into the lungs every morning. 2 puffs    . pentoxifylline (TRENTAL) 400 MG CR  tablet Take 400 mg by mouth 2 (two) times daily.    Marland Kitchen rOPINIRole (REQUIP) 0.25 MG tablet Take 0.25 mg by mouth at bedtime.     . sodium chloride 1 g tablet Take 1 g by mouth 3 (three) times daily.    Marland Kitchen sulfamethoxazole-trimethoprim (BACTRIM DS) 800-160 MG tablet Take 1 tablet by mouth 2 (two) times daily.    Marland Kitchen azithromycin (ZITHROMAX Z-PAK) 250 MG tablet 2 po day one, then 1 daily x 4 days (Patient not taking: Reported on 03/13/2018) 5 tablet 0  . oxyCODONE-acetaminophen (PERCOCET) 5-325 MG tablet Take 1-2 tablets by mouth every 4 (four) hours as needed. (Patient not taking: Reported on 03/13/2018) 20 tablet 0    10/06 0701 - 10/07 0700 In: 83 [I.V.:83] Out: -   Total I/O In: 83 [I.V.:83] Out: -    Review of Systems  Constitution: Negative for chills, decreased appetite, malaise/fatigue and weight gain.  Cardiovascular: Positive for claudication and dyspnea on exertion. Negative for leg swelling and syncope.  Respiratory: Positive for wheezing (chronic).   Endocrine: Negative for cold intolerance.  Hematologic/Lymphatic: Does not bruise/bleed easily.  Skin: Positive for rash (right 4th toe).  Musculoskeletal: Positive for joint pain. Negative for joint swelling.  Gastrointestinal: Negative for abdominal pain, anorexia, change in bowel habit, hematochezia and melena.  Neurological: Negative for headaches and light-headedness.  Psychiatric/Behavioral: Negative for depression and substance abuse.  All other systems reviewed and are negative.       Objective:  Blood pressure (!) 116/58, pulse 85, resp. rate (!) 24, height 5\' 10"  (1.778 m), weight 52.2 kg, SpO2 98 %. Body mass index is 16.5 kg/m. Vitals with BMI 12/23/2018 12/23/2018 12/23/2018  Height - - -  Weight - - -  BMI - - -  Systolic 778 - -  Diastolic 58 - -  Pulse 85 87 87    Blood pressure (!) 116/58, pulse 85, resp. rate (!) 24, height 5\' 10"  (1.778 m), weight 52.2 kg, SpO2 98 %. Body mass index is 16.5 kg/m.    Physical Exam  Constitutional:  Is well-built and petite, no acute distress.  HENT:  Head: Atraumatic.  Eyes: Conjunctivae are normal.  Neck: Neck supple. No JVD present. No thyromegaly present.  Cardiovascular: Normal rate, regular rhythm, normal heart sounds and intact distal pulses. Exam reveals no gallop.  No murmur heard. Pulmonary/Chest: Effort normal and breath sounds normal.  Barrel-shaped chest  Abdominal: Soft. Bowel sounds are normal.  Musculoskeletal: Normal range of motion.  Neurological: He is alert.  Skin: Skin is warm and dry.  Psychiatric: He has a normal mood and affect.   Radiology: Dg Chest Port 1 View  Result Date: 12/23/2018 CLINICAL DATA:  Shortness of breath EXAM: PORTABLE CHEST 1 VIEW COMPARISON:  12/06/2017 FINDINGS: Unchanged position of left chest wall AICD leads. Lungs are hyperinflated with diffuse interstitial coarsening. Normal cardiomediastinal contours. No focal airspace consolidation or pulmonary edema. IMPRESSION: 1. Unchanged appearance of left chest wall AICD. 2. COPD without focal airspace disease. Electronically Signed   By: Ulyses Jarred M.D.   On: 12/23/2018 00:15   Dg Foot Complete Right  Result Date: 12/23/2018 CLINICAL DATA:  Right little toe wound EXAM: RIGHT FOOT COMPLETE - 3+ VIEW COMPARISON:  None. FINDINGS: There is no evidence of fracture or dislocation. There is no evidence of arthropathy or other focal bone abnormality. Mild soft tissue swelling at the little toe. No ulceration visible. IMPRESSION: No evidence of osteomyelitis or other acute abnormality of the right foot. Electronically Signed   By: Ulyses Jarred M.D.   On: 12/23/2018 00:20    Laboratory examination:   Recent Labs    12/22/18 2354  NA 137  K 3.6  CL 104  CO2 23  GLUCOSE 164*  BUN 18  CREATININE 1.60*  CALCIUM 8.1*  GFRNONAA 42*  GFRAA 48*   CMP Latest Ref Rng & Units 12/22/2018 12/06/2017  Glucose 70 - 99 mg/dL 164(H) 155(H)  BUN 8 - 23 mg/dL 18 16   Creatinine 0.61 - 1.24 mg/dL 1.60(H) 1.42(H)  Sodium 135 - 145 mmol/L 137 132(L)  Potassium 3.5 - 5.1 mmol/L 3.6 4.2  Chloride 98 - 111 mmol/L 104 93(L)  CO2 22 - 32 mmol/L 23 25  Calcium 8.9 - 10.3 mg/dL 8.1(L) 9.3   CBC Latest Ref Rng & Units 12/22/2018 12/06/2017  WBC 4.0 - 10.5 K/uL 9.7 11.4(H)  Hemoglobin 13.0 - 17.0 g/dL 12.1(L) 12.3(L)  Hematocrit 39.0 - 52.0 % 36.6(L) 38.4(L)  Platelets 150 - 400 K/uL 148(L) 216   Lipid Panel  No results found for: CHOL, TRIG, HDL, CHOLHDL, VLDL, LDLCALC, LDLDIRECT HEMOGLOBIN A1C No results found for: HGBA1C, MPG TSH No results for input(s): TSH in the last 8760 hours. Medications   Prior to Admission medications   Medication Sig Start Date End Date Taking? Authorizing Provider  acetaminophen (TYLENOL) 500 MG tablet Take 500 mg by mouth 2 (two) times daily.   Yes [provider]  amiodarone (PACERONE) 200 MG tablet Take 100 mg by mouth daily.    Yes [provider]  apixaban (ELIQUIS) 5 MG TABS tablet Take 2.5 mg by mouth 2 (two) times daily.   Yes [provider]  carvedilol (COREG) 3.125 MG tablet Take 3.125 mg by mouth 2 (two) times daily with a meal.   Yes [provider]  diphenhydrAMINE (BENADRYL) 25 MG tablet Take 25 mg by mouth at bedtime.   Yes [provider]  furosemide (LASIX) 20 MG tablet Take 20 mg by mouth daily.    Yes [provider]  Glycopyrrolate-Formoterol (BEVESPI AEROSPHERE) 9-4.8 MCG/ACT AERO Inhale 2 puffs into the lungs 2 (two) times daily.   Yes [provider]  OLODATEROL HCL IN Inhale 2.5 mg into the lungs every morning. 2 puffs   Yes [provider]  pentoxifylline (TRENTAL) 400 MG CR tablet Take 400 mg by mouth 2 (two) times daily. 12/01/18  Yes [provider]  rOPINIRole (REQUIP) 0.25 MG tablet Take 0.25 mg by mouth  at bedtime.    Yes [provider]  sodium chloride 1 g tablet Take 1 g by mouth 3 (three) times daily.    Yes [provider]  sulfamethoxazole-trimethoprim (BACTRIM DS) 800-160 MG tablet Take 1 tablet by mouth 2 (two) times daily. 12/21/18 12/31/18 Yes [provider]  azithromycin (ZITHROMAX Z-PAK) 250 MG tablet 2 po day one, then 1 daily x 4 days Patient not taking: Reported on 03/13/2018 12/06/17   Margarita Mail, PA-C  oxyCODONE-acetaminophen (PERCOCET) 5-325 MG tablet Take 1-2 tablets by mouth every 4 (four) hours as needed. Patient not taking: Reported on 03/13/2018 12/06/17   Margarita Mail, PA-C     Current Outpatient Medications  Medication Instructions  . acetaminophen (TYLENOL) 500 mg, Oral, 2 times daily  . amiodarone (PACERONE) 100 mg, Oral, Daily  . apixaban (ELIQUIS) 2.5 mg, Oral, 2 times daily  . azithromycin (ZITHROMAX Z-PAK) 250 MG tablet 2 po day one, then 1 daily x 4 days  . carvedilol (COREG) 3.125 mg, Oral, 2 times daily with meals  . diphenhydrAMINE (BENADRYL) 25 mg, Oral, Daily at bedtime  . furosemide (LASIX) 20 mg, Oral, Daily  . Glycopyrrolate-Formoterol (BEVESPI AEROSPHERE) 9-4.8 MCG/ACT AERO 2 puffs, Inhalation, 2 times daily  . OLODATEROL HCL IN 2.5 mg, Inhalation, BH-each morning, 2 puffs   . oxyCODONE-acetaminophen (PERCOCET) 5-325 MG tablet 1-2 tablets, Oral, Every 4 hours PRN  . pentoxifylline (TRENTAL) 400 mg, Oral, 2 times daily  . rOPINIRole (REQUIP) 0.25 mg, Oral, Daily at bedtime  . sodium chloride 1 g, Oral, 3 times daily  . sulfamethoxazole-trimethoprim (BACTRIM DS) 800-160 MG tablet 1 tablet, Oral, 2 times daily    Cardiac Studies:   None  Assessment     ICD-10-CM   1. Atrial fibrillation with RVR (HCC)  I48.91   2. Ventricular tachycardia (HCC)  I47.2    CHA2DS2-VASc Score is 7.  Yearly risk of stroke: 9.8%. (Age >75, stroke, HTN, CHF, Vasc Dz).    3.  Nonischemic cardiomyopathy with severe LV systolic dysfunction, EF 40% as per patient's history 4.  Essential hypertension 5.  Hyperlipidemia 6.  Peripheral arterial  disease with claudication and critical limb ischemia with a ischemic toe right fourth. 7.  Chronic stage III kidney disease 8.  History of lung cancer, S/P chemo and radiation therapy, in remission since 2013. 9.  History of TIA presenting with dysarthria in 2014, presently on Eliquis long-term. 10.  COPD and emphysema   EKG 12/23/2018: Atrial fibrillation with rapid ventricular response at the rate of 135 bpm, left axis deviation, left plantar fascicular block.  IVCD, borderline criteria for LVH.  Anteroseptal infarct old.  Frequent PVCs.  Nonspecific T abnormality.  Compared to 12/06/2017, atrial fibrillation with RVR is new.  Otherwise marked ST depression with T wave inversion noted in the inferior leads and anterolateral leads is no longer present.  Recommendations:   Patient referred to EP for evaluation.  I am not convinced that his wide-complex tachycardia is not atrial fibrillation with rapid ventricular response.  Continue IV amiodarone for now, await EP evaluation.  Indeed if VT is confirmed, he will need coronary angiography.  Cardiac markers are negative for myocardial injury so far.  Minimally elevated troponin related to demand ischemia.  He has critical limb ischemia.  He needs relatively urgent peripheral arteriogram as well.  He has established himself with vascular surgeon at [facility], patient has to decide whether he would like to proceed there or he would want [place] to take care of  him.  Will discuss further as we go along.  Adrian Prows, MD, [facility] 12/23/2018, 6:03 AM [facility] [place] Pager: [phone] Office: [phone] If no answer Cell [phone]

## 2018-12-23 NOTE — Progress Notes (Signed)
  Amiodarone Drug - Drug Interaction Consult Note  Recommendations: Continuation of home medication regimen.  Monitor for potentail drug interactions Amiodarone is metabolized by the cytochrome P450 system and therefore has the potential to cause many drug interactions. Amiodarone has an average plasma half-life of 50 days (range 20 to 100 days).   There is potential for drug interactions to occur several weeks or months after stopping treatment and the onset of drug interactions may be slow after initiating amiodarone.   []  Statins: Increased risk of myopathy. Simvastatin- restrict dose to 20mg  daily. Other statins: counsel patients to report any muscle pain or weakness immediately.  []  Anticoagulants: Amiodarone can increase anticoagulant effect. Consider warfarin dose reduction. Patients should be monitored closely and the dose of anticoagulant altered accordingly, remembering that amiodarone levels take several weeks to stabilize.  []  Antiepileptics: Amiodarone can increase plasma concentration of phenytoin, the dose should be reduced. Note that small changes in phenytoin dose can result in large changes in levels. Monitor patient and counsel on signs of toxicity.  [x]  Beta blockers: increased risk of bradycardia, AV block and myocardial depression. Sotalol - avoid concomitant use.  []   Calcium channel blockers (diltiazem and verapamil): increased risk of bradycardia, AV block and myocardial depression.  []   Cyclosporine: Amiodarone increases levels of cyclosporine. Reduced dose of cyclosporine is recommended.  []  Digoxin dose should be halved when amiodarone is started.  [x]  Diuretics: increased risk of cardiotoxicity if hypokalemia occurs.  []  Oral hypoglycemic agents (glyburide, glipizide, glimepiride): increased risk of hypoglycemia. Patient's glucose levels should be monitored closely when initiating amiodarone therapy.   []  Drugs that prolong the QT interval:  Torsades de pointes  risk may be increased with concurrent use - avoid if possible.  Monitor QTc, also keep magnesium/potassium WNL if concurrent therapy can't be avoided. Marland Kitchen Antibiotics: e.g. fluoroquinolones, erythromycin. . Antiarrhythmics: e.g. quinidine, procainamide, disopyramide, sotalol. . Antipsychotics: e.g. phenothiazines, haloperidol.  . Lithium, tricyclic antidepressants, and methadone. Thank You,  Brett Saunders  12/23/2018 1:20 AM

## 2018-12-23 NOTE — ED Notes (Signed)
Dinner tray ordered.

## 2018-12-24 LAB — T3, FREE: T3, Free: 1.2 pg/mL — ABNORMAL LOW (ref 2.0–4.4)

## 2018-12-25 ENCOUNTER — Other Ambulatory Visit: Payer: Self-pay | Admitting: Cardiology

## 2018-12-25 DIAGNOSIS — I420 Dilated cardiomyopathy: Secondary | ICD-10-CM

## 2018-12-25 DIAGNOSIS — I480 Paroxysmal atrial fibrillation: Secondary | ICD-10-CM

## 2018-12-25 DIAGNOSIS — I739 Peripheral vascular disease, unspecified: Secondary | ICD-10-CM

## 2018-12-25 DIAGNOSIS — I426 Alcoholic cardiomyopathy: Secondary | ICD-10-CM

## 2019-01-01 ENCOUNTER — Other Ambulatory Visit: Payer: Self-pay | Admitting: Student

## 2019-01-11 ENCOUNTER — Ambulatory Visit (INDEPENDENT_AMBULATORY_CARE_PROVIDER_SITE_OTHER): Payer: Medicare HMO

## 2019-01-11 ENCOUNTER — Other Ambulatory Visit: Payer: Self-pay

## 2019-01-11 DIAGNOSIS — I739 Peripheral vascular disease, unspecified: Secondary | ICD-10-CM | POA: Diagnosis not present

## 2019-01-11 DIAGNOSIS — I480 Paroxysmal atrial fibrillation: Secondary | ICD-10-CM | POA: Diagnosis not present

## 2019-01-11 DIAGNOSIS — I420 Dilated cardiomyopathy: Secondary | ICD-10-CM | POA: Diagnosis not present

## 2019-01-20 ENCOUNTER — Telehealth: Payer: Self-pay

## 2019-01-20 NOTE — Telephone Encounter (Signed)
I am sorry to hear this. Please call and schedule in next available date. Probalby not a bad idea to put either an OV or cath on date so we wont miss treatments

## 2019-01-22 ENCOUNTER — Other Ambulatory Visit (HOSPITAL_COMMUNITY): Payer: Self-pay

## 2019-02-03 ENCOUNTER — Ambulatory Visit: Payer: Self-pay | Admitting: Cardiology

## 2019-02-05 ENCOUNTER — Encounter (HOSPITAL_COMMUNITY): Payer: Self-pay | Admitting: *Deleted

## 2019-02-05 NOTE — Progress Notes (Signed)
Received new [facility] authorization - [id number] from Dr. Gwenette Greet for this pt to participate in pulmonary rehab with the diagnosis of COPD.  Pt is well known by rehab staff from previous participation in pulmonary rehab earlier this year.  Pt completed 16 prior to the closure of the program due to [place].  Pt with ER visit due to inappropriate ICD shocking for rapid Afib.  Pt seen in follow up by the the device clinic and cardiology clinic at the [facility]. Clinical review of pt follow up appt on 11 Pulmonary office note. Covid Risk Score 4.  Pt appropriate for scheduling for Pulmonary rehab.  Will forward to support staff for scheduling. Cherre Huger, BSN Cardiac and Training and development officer

## 2019-02-15 ENCOUNTER — Telehealth (HOSPITAL_COMMUNITY): Payer: Self-pay

## 2019-02-17 ENCOUNTER — Ambulatory Visit: Payer: Medicare HMO | Admitting: Cardiology

## 2019-02-22 ENCOUNTER — Other Ambulatory Visit: Payer: Self-pay

## 2019-02-22 ENCOUNTER — Encounter (HOSPITAL_COMMUNITY)
Admission: RE | Admit: 2019-02-22 | Discharge: 2019-02-22 | Disposition: A | Discharge status: Home / Self Care | Payer: No Typology Code available for payment source | Source: Ambulatory Visit | Attending: Pulmonary Disease | Admitting: Pulmonary Disease

## 2019-02-22 VITALS — BP 122/56 | HR 71 | Ht 71.0 in | Wt 117.1 lb

## 2019-02-22 DIAGNOSIS — J449 Chronic obstructive pulmonary disease, unspecified: Secondary | ICD-10-CM | POA: Insufficient documentation

## 2019-02-22 NOTE — Progress Notes (Signed)
Pulmonary Individual Treatment Plan  Patient Details   Name: Brett Saunders MRN: [id number] Date of Birth: [birth year] Referring Provider:     Pulmonary Rehab Walk Test from 02/22/2019 in [place]  Referring Provider  Dr. Gwenette Greet      Initial Encounter Date:    Pulmonary Rehab Walk Test from 02/22/2019 in [place]  Date  02/22/19      Visit Diagnosis: Chronic obstructive pulmonary disease, unspecified COPD type ([place])  Patient's Home Medications on Admission:   Current Outpatient Medications:  .  acetaminophen (TYLENOL) 500 MG tablet, Take 500 mg by mouth 2 (two) times daily., Disp: , Rfl:  .  albuterol (PROVENTIL) (2.5 MG/3ML) 0.083% nebulizer solution, Take 2.5 mg by nebulization every 6 (six) hours as needed for wheezing or shortness of breath., Disp: , Rfl:  .  amiodarone (PACERONE) 200 MG tablet, Take 2 tablets (400 mg total) by mouth 2 (two) times daily. 2 tab twice daily for 2 weeks, 1 tablet twice daily for two weeks then one tablet daily, Disp: 90 tablet, Rfl: 1 .  apixaban (ELIQUIS) 5 MG TABS tablet, Take 2.5 mg by mouth 2 (two) times daily., Disp: , Rfl:  .  diphenhydrAMINE (BENADRYL) 25 MG tablet, Take 25 mg by mouth at bedtime., Disp: , Rfl:  .  furosemide (LASIX) 20 MG tablet, Take 20 mg by mouth daily. , Disp: , Rfl:  .  metoprolol succinate (TOPROL-XL) 25 MG 24 hr tablet, Take 25 mg by mouth daily., Disp: , Rfl:  .  OLODATEROL HCL IN, Inhale 2.5 mg into the lungs every morning. 2 puffs, Disp: , Rfl:  .  potassium chloride (KLOR-CON) 10 MEQ tablet, Take 1 tablet (10 mEq total) by mouth daily., Disp: 30 tablet, Rfl: 1 .  rOPINIRole (REQUIP) 0.25 MG tablet, Take 0.25 mg by mouth at bedtime. , Disp: , Rfl:  .  sodium chloride 1 g tablet, Take 1 g by mouth 3 (three) times daily., Disp: , Rfl:  .  carvedilol (COREG) 3.125 MG tablet, Take 3.125 mg by mouth 2 (two) times daily with a meal., Disp: , Rfl:  .   Glycopyrrolate-Formoterol (BEVESPI AEROSPHERE) 9-4.8 MCG/ACT AERO, Inhale 2 puffs into the lungs 2 (two) times daily., Disp: , Rfl:  .  pentoxifylline (TRENTAL) 400 MG CR tablet, Take 400 mg by mouth 2 (two) times daily., Disp: , Rfl:   Past Medical History: Past Medical History:  Diagnosis Date  . Aortic anomaly    aortic calcification  . Arrhythmia    a fib  . Cancer ([place])    lung  . Chronic obstructive lung disease ([place])   . Hypertension   . Ischemic cardiomyopathy   . Peripheral vascular disease ([place])   . Stroke [facility])    oct 2012    Tobacco Use: Social History   Tobacco Use  Smoking Status Light Tobacco Smoker  . Packs/day: 0.10  . Types: Cigarettes  Smokeless Tobacco Never Used  Tobacco Comment   pt smokes 1 cigarrete occasionally. 1 pack "last a few months"    Labs: Recent Review Flowsheet Data    There is no flowsheet data to display.      Capillary Blood Glucose: No results found for: GLUCAP   Pulmonary Assessment Scores: Pulmonary Assessment Scores    Row Name 02/22/19 1000 02/22/19 1030       ADL UCSD   ADL Phase  Entry  Entry    SOB Score total  45  -  CAT Score   CAT Score  22  -      mMRC Score   mMRC Score  -  2      UCSD: Self-administered rating of dyspnea associated with activities of daily living (ADLs) 6-point scale (0 = "not at all" to 5 = "maximal or unable to do because of breathlessness")  Scoring Scores range from 0 to 120.  Minimally important difference is 5 units  CAT: CAT can identify the health impairment of COPD patients and is better correlated with disease progression.  CAT has a scoring range of zero to 40. The CAT score is classified into four groups of low (less than 10), medium (10 - 20), high (21-30) and very high (31-40) based on the impact level of disease on health status. A CAT score over 10 suggests significant symptoms.  A worsening CAT score could be explained by an exacerbation, poor medication  adherence, poor inhaler technique, or progression of COPD or comorbid conditions.  CAT MCID is 2 points  mMRC: mMRC (Modified Medical Research Council) Dyspnea Scale is used to assess the degree of baseline functional disability in patients of respiratory disease due to dyspnea. No minimal important difference is established. A decrease in score of 1 point or greater is considered a positive change.   Pulmonary Function Assessment: Pulmonary Function Assessment - 02/22/19 0950      Breath   Bilateral Breath Sounds  Clear;Decreased    Shortness of Breath  Yes;Fear of Shortness of Breath;Limiting activity;Panic with Shortness of Breath       Exercise Target Goals: Exercise Program Goal: Individual exercise prescription set using results from initial 6 min walk test and THRR while considering  patient's activity barriers and safety.   Exercise Prescription Goal: Initial exercise prescription builds to 30-45 minutes a day of aerobic activity, 2-3 days per week.  Home exercise guidelines will be given to patient during program as part of exercise prescription that the participant will acknowledge.  Activity Barriers & Risk Stratification: Activity Barriers & Cardiac Risk Stratification - 02/22/19 0945      Activity Barriers & Cardiac Risk Stratification   Activity Barriers  Shortness of Breath;Muscular Weakness;Deconditioning;Back Problems       6 Minute Walk: 6 Minute Walk    Row Name 02/22/19 1030         6 Minute Walk   Phase  Initial     Distance  1055 feet     Walk Time  6 minutes     # of Rest Breaks  0     MPH  2     METS  3.02     RPE  12     Perceived Dyspnea   3     VO2 Peak  10.56     Symptoms  Yes (comment)     Comments  claudication in both calves     Resting HR  75 bpm     Resting BP  112/58     Resting Oxygen Saturation   98 %     Exercise Oxygen Saturation  during 6 min walk  94 %     Max Ex. HR  96 bpm     Max Ex. BP  140/68     2 Minute Post BP   108/64       Interval HR   1 Minute HR  96     2 Minute HR  96     3 Minute HR  95  4 Minute HR  94     5 Minute HR  95     6 Minute HR  95     2 Minute Post HR  85     Interval Heart Rate?  Yes       Interval Oxygen   Interval Oxygen?  Yes     Baseline Oxygen Saturation %  98 %     1 Minute Oxygen Saturation %  95 %     1 Minute Liters of Oxygen  0 L     2 Minute Oxygen Saturation %  94 %     2 Minute Liters of Oxygen  0 L     3 Minute Oxygen Saturation %  96 %     3 Minute Liters of Oxygen  0 L     4 Minute Oxygen Saturation %  94 %     4 Minute Liters of Oxygen  0 L     5 Minute Oxygen Saturation %  94 %     5 Minute Liters of Oxygen  0 L     6 Minute Oxygen Saturation %  95 %     6 Minute Liters of Oxygen  0 L     2 Minute Post Oxygen Saturation %  97 %     2 Minute Post Liters of Oxygen  0 L        Oxygen Initial Assessment: Oxygen Initial Assessment - 02/22/19 1029      Home Oxygen   Home Oxygen Device  Portable Concentrator;Home Concentrator;E-Tanks    Sleep Oxygen Prescription  Continuous    Home Exercise Oxygen Prescription  Pulsed    Liters per minute  2    Home at Rest Exercise Oxygen Prescription  None    Compliance with Home Oxygen Use  Yes      Initial 6 min Walk   Oxygen Used  None      Program Oxygen Prescription   Program Oxygen Prescription  None      Intervention   Short Term Goals  To learn and exhibit compliance with exercise, home and travel O2 prescription;To learn and understand importance of maintaining oxygen saturations>88%;To learn and demonstrate proper use of respiratory medications;To learn and understand importance of monitoring SPO2 with pulse oximeter and demonstrate accurate use of the pulse oximeter.;To learn and demonstrate proper pursed lip breathing techniques or other breathing techniques.    Long  Term Goals  Exhibits compliance with exercise, home and travel O2 prescription;Verbalizes importance of monitoring SPO2 with  pulse oximeter and return demonstration;Maintenance of O2 saturations>88%;Exhibits proper breathing techniques, such as pursed lip breathing or other method taught during program session;Compliance with respiratory medication;Demonstrates proper use of MDI's       Oxygen Re-Evaluation:   Oxygen Discharge (Final Oxygen Re-Evaluation):   Initial Exercise Prescription: Initial Exercise Prescription - 02/22/19 1000      Date of Initial Exercise RX and Referring Provider   Date  02/22/19    Referring Provider  Dr. Gwenette Greet      Treadmill   MPH  1.7    Grade  1    Minutes  15      Bike   Level  2    Minutes  15      Prescription Details   Frequency (times per week)  2    Duration  Progress to 30 minutes of continuous aerobic without signs/symptoms of physical distress      Intensity  THRR 40-80% of Max Heartrate  58-116    Ratings of Perceived Exertion  11-13    Perceived Dyspnea  0-4      Progression   Progression  Continue to progress workloads to maintain intensity without signs/symptoms of physical distress.      Resistance Training   Training Prescription  Yes    Weight  orange bands    Reps  10-15       Perform Capillary Blood Glucose checks as needed.  Exercise Prescription Changes:   Exercise Comments:   Exercise Goals and Review: Exercise Goals    Row Name 02/22/19 1048             Exercise Goals   Increase Physical Activity  Yes       Intervention  Provide advice, education, support and counseling about physical activity/exercise needs.;Develop an individualized exercise prescription for aerobic and resistive training based on initial evaluation findings, risk stratification, comorbidities and participant's personal goals.       Expected Outcomes  Short Term: Attend rehab on a regular basis to increase amount of physical activity.;Long Term: Add in home exercise to make exercise part of routine and to increase amount of physical activity.;Long Term:  Exercising regularly at least 3-5 days a week.       Increase Strength and Stamina  Yes       Intervention  Provide advice, education, support and counseling about physical activity/exercise needs.;Develop an individualized exercise prescription for aerobic and resistive training based on initial evaluation findings, risk stratification, comorbidities and participant's personal goals.       Expected Outcomes  Short Term: Increase workloads from initial exercise prescription for resistance, speed, and METs.;Short Term: Perform resistance training exercises routinely during rehab and add in resistance training at home;Long Term: Improve cardiorespiratory fitness, muscular endurance and strength as measured by increased METs and functional capacity (6MWT)       Able to understand and use rate of perceived exertion (RPE) scale  Yes       Intervention  Provide education and explanation on how to use RPE scale       Expected Outcomes  Short Term: Able to use RPE daily in rehab to express subjective intensity level;Long Term:  Able to use RPE to guide intensity level when exercising independently       Able to understand and use Dyspnea scale  Yes       Intervention  Provide education and explanation on how to use Dyspnea scale       Expected Outcomes  Short Term: Able to use Dyspnea scale daily in rehab to express subjective sense of shortness of breath during exertion;Long Term: Able to use Dyspnea scale to guide intensity level when exercising independently       Knowledge and understanding of Target Heart Rate Range (THRR)  Yes       Intervention  Provide education and explanation of THRR including how the numbers were predicted and where they are located for reference       Expected Outcomes  Short Term: Able to state/look up THRR;Long Term: Able to use THRR to govern intensity when exercising independently;Short Term: Able to use daily as guideline for intensity in rehab       Understanding of Exercise  Prescription  Yes       Intervention  Provide education, explanation, and written materials on patient's individual exercise prescription       Expected Outcomes  Short Term: Able to explain program  exercise prescription;Long Term: Able to explain home exercise prescription to exercise independently       Improve claudication pain toleration; Improve walking ability  Yes       Intervention  Participate in PAD/SET Rehab 2-3 days a week, walking at home as part of exercise prescription;Attend education sessions to aid in risk factor modification and understanding of disease process       Expected Outcomes  Short Term: Improve walking distance/time to onset of claudication pain;Long Term: Improve walking ability and toleration to claudication;Long Term: Improve score of PAD questionnaires          Exercise Goals Re-Evaluation :   Discharge Exercise Prescription (Final Exercise Prescription Changes):   Nutrition:  Target Goals: Understanding of nutrition guidelines, daily intake of sodium 1500mg , cholesterol 200mg , calories 30% from fat and 7% or less from saturated fats, daily to have 5 or more servings of fruits and vegetables.  Biometrics: Pre Biometrics - 02/22/19 0948      Pre Biometrics   Height  5\' 11"  (1.803 m)    Weight  53.1 kg    BMI (Calculated)  16.33    Grip Strength  24 kg        Nutrition Therapy Plan and Nutrition Goals:   Nutrition Assessments:   Nutrition Goals Re-Evaluation:   Nutrition Goals Discharge (Final Nutrition Goals Re-Evaluation):   Psychosocial: Target Goals: Acknowledge presence or absence of significant depression and/or stress, maximize coping skills, provide positive support system. Participant is able to verbalize types and ability to use techniques and skills needed for reducing stress and depression.  Initial Review & Psychosocial Screening: Initial Psych Review & Screening - 02/22/19 1001      Initial Review   Current issues with   None Identified      Family Dynamics   Good Support System?  Yes      Barriers   Psychosocial barriers to participate in program  There are no identifiable barriers or psychosocial needs.      Screening Interventions   Interventions  Encouraged to exercise       Quality of Life Scores:  Scores of 19 and below usually indicate a poorer quality of life in these areas.  A difference of  2-3 points is a clinically meaningful difference.  A difference of 2-3 points in the total score of the Quality of Life Index has been associated with significant improvement in overall quality of life, self-image, physical symptoms, and general health in studies assessing change in quality of life.  PHQ-9: Recent Review Flowsheet Data    Depression screen [facility] 2/9 02/22/2019 03/13/2018 03/13/2018   Decreased Interest 0 0 0   Down, Depressed, Hopeless 0 0 0   PHQ - 2 Score 0 0 0   Altered sleeping 1 0 0   Tired, decreased energy 0 3 3   Change in appetite 0 3 3   Feeling bad or failure about yourself  0 0 0   Trouble concentrating 0 0 0   Moving slowly or fidgety/restless 0 0 0   Suicidal thoughts 0 0 0   PHQ-9 Score - 6 6   Difficult doing work/chores Not difficult at all Somewhat difficult Somewhat difficult     Interpretation of Total Score  Total Score Depression Severity:  1-4 = Minimal depression, 5-9 = Mild depression, 10-14 = Moderate depression, 15-19 = Moderately severe depression, 20-27 = Severe depression   Psychosocial Evaluation and Intervention: Psychosocial Evaluation - 02/22/19 1001  Psychosocial Evaluation & Interventions   Interventions  Encouraged to exercise with the program and follow exercise prescription    Continue Psychosocial Services   No Follow up required       Psychosocial Re-Evaluation: Psychosocial Re-Evaluation    [place] Name 02/22/19 1001             Psychosocial Re-Evaluation   Current issues with  None Identified       Interventions  Encouraged  to attend Pulmonary Rehabilitation for the exercise       Continue Psychosocial Services   No Follow up required          Psychosocial Discharge (Final Psychosocial Re-Evaluation): Psychosocial Re-Evaluation - 02/22/19 1001      Psychosocial Re-Evaluation   Current issues with  None Identified    Interventions  Encouraged to attend Pulmonary Rehabilitation for the exercise    Continue Psychosocial Services   No Follow up required       Education: Education Goals: Education classes will be provided on a weekly basis, covering required topics. Participant will state understanding/return demonstration of topics presented.  Learning Barriers/Preferences: Learning Barriers/Preferences - 02/22/19 1002      Learning Barriers/Preferences   Learning Barriers  None    Learning Preferences  Audio;Computer/Internet;Group Instruction;Individual Instruction;Pictoral;Skilled Demonstration;Verbal Instruction;Video;Written Material       Education Topics: Risk Factor Reduction:  -Group instruction that is supported by a PowerPoint presentation. Instructor discusses the definition of a risk factor, different risk factors for pulmonary disease, and how the heart and lungs work together.     Nutrition for Pulmonary Patient:  -Group instruction provided by PowerPoint slides, verbal discussion, and written materials to support subject matter. The instructor gives an explanation and review of healthy diet recommendations, which includes a discussion on weight management, recommendations for fruit and vegetable consumption, as well as protein, fluid, caffeine, fiber, sodium, sugar, and alcohol. Tips for eating when patients are short of breath are discussed.   PULMONARY REHAB CHRONIC OBSTRUCTIVE PULMONARY DISEASE from 05/28/2018 in [place]  Date  03/26/18  Educator  Rodman Pickle  Instruction Review Code  1- Verbalizes Understanding      Pursed Lip Breathing:  -Group  instruction that is supported by demonstration and informational handouts. Instructor discusses the benefits of pursed lip and diaphragmatic breathing and detailed demonstration on how to preform both.     Oxygen Safety:  -Group instruction provided by PowerPoint, verbal discussion, and written material to support subject matter. There is an overview of "What is Oxygen" and "Why do we need it".  Instructor also reviews how to create a safe environment for oxygen use, the importance of using oxygen as prescribed, and the risks of noncompliance. There is a brief discussion on traveling with oxygen and resources the patient may utilize.   PULMONARY REHAB CHRONIC OBSTRUCTIVE PULMONARY DISEASE from 05/28/2018 in [place]  Date  04/23/18  Educator  Cloyde Reams  Instruction Review Code  1- Verbalizes Understanding      Oxygen Equipment:  -Group instruction provided by [employer] utilizing handouts, written materials, and Insurance underwriter.   Signs and Symptoms:  -Group instruction provided by written material and verbal discussion to support subject matter. Warning signs and symptoms of infection, stroke, and heart attack are reviewed and when to call the physician/911 reinforced. Tips for preventing the spread of infection discussed.   PULMONARY REHAB CHRONIC OBSTRUCTIVE PULMONARY DISEASE from 05/28/2018 in [place]  Date  05/14/18  Educator  Remo Lipps  Instruction Review Code  1- Verbalizes Understanding      Advanced Directives:  -Group instruction provided by verbal instruction and written material to support subject matter. Instructor reviews Advanced Directive laws and proper instruction for filling out document.   Pulmonary Video:  -Group video education that reviews the importance of medication and oxygen compliance, exercise, good nutrition, pulmonary hygiene, and pursed lip and diaphragmatic breathing for the  pulmonary patient.   Exercise for the Pulmonary Patient:  -Group instruction that is supported by a PowerPoint presentation. Instructor discusses benefits of exercise, core components of exercise, frequency, duration, and intensity of an exercise routine, importance of utilizing pulse oximetry during exercise, safety while exercising, and options of places to exercise outside of rehab.     PULMONARY REHAB CHRONIC OBSTRUCTIVE PULMONARY DISEASE from 05/28/2018 in [place]  Date  05/07/18  Educator  Kirk Ruths  Instruction Review Code  1- Verbalizes Understanding      Pulmonary Medications:  -Verbally interactive group education provided by instructor with focus on inhaled medications and proper administration.   PULMONARY REHAB CHRONIC OBSTRUCTIVE PULMONARY DISEASE from 05/28/2018 in [place]  Date  04/07/18  Educator  pharmacy  Instruction Review Code  1- Verbalizes Understanding      Anatomy and Physiology of the Respiratory System and Intimacy:  -Group instruction provided by PowerPoint, verbal discussion, and written material to support subject matter. Instructor reviews respiratory cycle and anatomical components of the respiratory system and their functions. Instructor also reviews differences in obstructive and restrictive respiratory diseases with examples of each. Intimacy, Sex, and Sexuality differences are reviewed with a discussion on how relationships can change when diagnosed with pulmonary disease. Common sexual concerns are reviewed.   PULMONARY REHAB CHRONIC OBSTRUCTIVE PULMONARY DISEASE from 05/28/2018 in [place]  Date  05/28/18  Educator  rn  Instruction Review Code  1- Verbalizes Understanding      MD DAY -A group question and answer session with a medical doctor that allows participants to ask questions that relate to their pulmonary disease state.   OTHER  EDUCATION -Group or individual verbal, written, or video instructions that support the educational goals of the pulmonary rehab program.   PULMONARY REHAB CHRONIC OBSTRUCTIVE PULMONARY DISEASE from 05/28/2018 in [place]  Date  04/30/18  Educator  Iona Beard [[employer]]  Instruction Review Code  1- Verbalizes Understanding      Holiday Eating Survival Tips:  -Group instruction provided by PowerPoint slides, verbal discussion, and written materials to support subject matter. The instructor gives patients tips, tricks, and techniques to help them not only survive but enjoy the holidays despite the onslaught of food that accompanies the holidays.   Knowledge Questionnaire Score: Knowledge Questionnaire Score - 02/22/19 1002      Knowledge Questionnaire Score   Pre Score  16/18       Core Components/Risk Factors/Patient Goals at Admission: Personal Goals and Risk Factors at Admission - 02/22/19 1002      Core Components/Risk Factors/Patient Goals on Admission    Weight Management  Weight Gain    Improve shortness of breath with ADL's  Yes    Intervention  Provide education, individualized exercise plan and daily activity instruction to help decrease symptoms of SOB with activities of daily living.    Expected Outcomes  Short Term: Improve cardiorespiratory fitness to achieve a reduction of symptoms when performing  ADLs;Long Term: Be able to perform more ADLs without symptoms or delay the onset of symptoms       Core Components/Risk Factors/Patient Goals Review:  Goals and Risk Factor Review    Row Name 02/22/19 1003             Core Components/Risk Factors/Patient Goals Review   Personal Goals Review  Improve shortness of breath with ADL's;Increase knowledge of respiratory medications and ability to use respiratory devices properly.;Develop more efficient breathing techniques such as purse lipped breathing and diaphragmatic breathing and practicing  self-pacing with activity.;Weight Management/Obesity weight gain needed          Core Components/Risk Factors/Patient Goals at Discharge (Final Review):  Goals and Risk Factor Review - 02/22/19 1003      Core Components/Risk Factors/Patient Goals Review   Personal Goals Review  Improve shortness of breath with ADL's;Increase knowledge of respiratory medications and ability to use respiratory devices properly.;Develop more efficient breathing techniques such as purse lipped breathing and diaphragmatic breathing and practicing self-pacing with activity.;Weight Management/Obesity   weight gain needed      ITP Comments:   Comments:

## 2019-02-22 NOTE — Progress Notes (Signed)
Brett Saunders 73 y.o. male Pulmonary Rehab Orientation Note Patient arrived today in Cardiac and [facility]monary Rehab for orientation to Pulmonary Rehab. He walked from the [facility]. parking lot without difficulty . He does carry portable oxygen. Per pt, he uses oxygen frequently. He sleeps with 2L continuously at night, none at rest and 2-3 L with activity.  Color good, skin warm and dry. Patient is oriented to time and place. Patient's medical history, psychosocial health, and medications reviewed. Psychosocial assessment reveals pt lives with their spouse. Pt is currently retired. Pt hobbies include yard work and chores around the house, he likes to stay busy. Pt reports his stress level is low. Areas of stress/anxiety include his shortness of breath when doing physical activities.  Pt does not exhibit signs of depression. PHQ2/9 score 1/1. Pt shows good  coping skills with positive outlook . Will continue to monitor and evaluate progress toward psychosocial goal(s) of no barriers or psychosocial concerns while in pulmonary rehab. Physical assessment reveals heart rate is normal, breath sounds clear to auscultation, no wheezes, rales, or rhonchi. Grip strength equal, strong. Patient reports he does take medications as prescribed. Patient states he follows a Regular diet. The patient has been trying to gain weight by drinking Ensure 2-3 times/day.. Patient's weight will be monitored closely. Demonstration and practice of PLB using pulse oximeter. Patient able to return demonstration satisfactorily. Safety and hand hygiene in the exercise area reviewed with patient. Patient voices understanding of the information reviewed. Department expectations discussed with patient and achievable goals were set. The patient shows enthusiasm about attending the program and we look forward to working with this nice gentleman. He was enrolled in pulmonary rehab approximately 1 year ago and benefited from the program. The patient  completed a 6 min walk test today and to begin exercise on Tuesday, March 02, 2019 in the 1015 class.  [phone]

## 2019-03-02 ENCOUNTER — Other Ambulatory Visit: Payer: Self-pay

## 2019-03-02 ENCOUNTER — Encounter (HOSPITAL_COMMUNITY)
Admission: RE | Admit: 2019-03-02 | Discharge: 2019-03-02 | Disposition: A | Discharge status: Home / Self Care | Payer: No Typology Code available for payment source | Source: Ambulatory Visit | Attending: Pulmonary Disease | Admitting: Pulmonary Disease

## 2019-03-02 VITALS — Wt 118.6 lb

## 2019-03-02 DIAGNOSIS — J449 Chronic obstructive pulmonary disease, unspecified: Secondary | ICD-10-CM

## 2019-03-02 NOTE — Progress Notes (Signed)
Daily Session Note  Patient Details  Name: Brett Saunders MRN: [id number] Date of Birth: 24-Apr-1945 Referring Provider:     Pulmonary Rehab Walk Test from 02/22/2019 in [place]  Referring Provider  Dr. Gwenette Greet      Encounter Date: 03/02/2019  Check In: Session Check In - 03/02/19 1042      Check-In   Supervising physician immediately available to respond to emergencies  [facility] immediately available    Physician(s)  Dr. Earnest Conroy    Location  [facility]    Staff Present  Rosebud Poles, RN, Bjorn Loser, MS, Exercise Physiologist;Lisa Ysidro Evert, RN    Virtual Visit  No    Medication changes reported      No    Fall or balance concerns reported     No    Tobacco Cessation  No Change    Warm-up and Cool-down  Performed on first and last piece of equipment    Resistance Training Performed  Yes    VAD Patient?  No    PAD/SET Patient?  No      Pain Assessment   Currently in Pain?  No/denies    Multiple Pain Sites  No       Capillary Blood Glucose: No results found for this or any previous visit (from the past 24 hour(s)).  Exercise Prescription Changes - 03/02/19 1100      Response to Exercise   Blood Pressure (Admit)  108/52    Blood Pressure (Exercise)  144/64    Blood Pressure (Exit)  102/64    Heart Rate (Admit)  85 bpm    Heart Rate (Exercise)  94 bpm    Heart Rate (Exit)  74 bpm    Oxygen Saturation (Admit)  98 %    Oxygen Saturation (Exercise)  97 %    Oxygen Saturation (Exit)  100 %    Rating of Perceived Exertion (Exercise)  13    Perceived Dyspnea (Exercise)  3    Duration  Continue with 30 min of aerobic exercise without signs/symptoms of physical distress.    Intensity  THRR unchanged      Resistance Training   Training Prescription  Yes    Weight  orange bands    Reps  10-15    Time  10 Minutes      Bike   Level  1    Minutes  15    METs  2.1      NuStep   Level  2    SPM  80    Minutes  15    METs  2.1       Social History   Tobacco Use  Smoking Status Light Tobacco Smoker  . Packs/day: 0.10  . Types: Cigarettes  Smokeless Tobacco Never Used  Tobacco Comment   pt smokes 1 cigarrete occasionally. 1 pack "last a few months"    Goals Met:  Independence with exercise equipment Exercise tolerated well Strength training completed today  Goals Unmet:  Not Applicable  Comments: Service time is from 1000 to 1109    Dr. Rush Farmer is Medical Director for Pulmonary Rehab at [facility].

## 2019-03-02 NOTE — Progress Notes (Signed)
Pulmonary Individual Treatment Plan  Patient Details  Name: Brett Saunders MRN: [id number] Date of Birth: 1946/02/22 Referring Provider:     Pulmonary Rehab Walk Test from 02/22/2019 in [place]  Referring Provider  Dr. Gwenette Greet      Initial Encounter Date:    Pulmonary Rehab Walk Test from 02/22/2019 in [place]  Date  02/22/19      Visit Diagnosis: Chronic obstructive pulmonary disease, unspecified COPD type ([place])  Patient's Home Medications on Admission:   Current Outpatient Medications:  .  acetaminophen (TYLENOL) 500 MG tablet, Take 500 mg by mouth 2 (two) times daily., Disp: , Rfl:  .  albuterol (PROVENTIL) (2.5 MG/3ML) 0.083% nebulizer solution, Take 2.5 mg by nebulization every 6 (six) hours as needed for wheezing or shortness of breath., Disp: , Rfl:  .  amiodarone (PACERONE) 200 MG tablet, Take 2 tablets (400 mg total) by mouth 2 (two) times daily. 2 tab twice daily for 2 weeks, 1 tablet twice daily for two weeks then one tablet daily, Disp: 90 tablet, Rfl: 1 .  apixaban (ELIQUIS) 5 MG TABS tablet, Take 2.5 mg by mouth 2 (two) times daily., Disp: , Rfl:  .  carvedilol (COREG) 3.125 MG tablet, Take 3.125 mg by mouth 2 (two) times daily with a meal., Disp: , Rfl:  .  diphenhydrAMINE (BENADRYL) 25 MG tablet, Take 25 mg by mouth at bedtime., Disp: , Rfl:  .  furosemide (LASIX) 20 MG tablet, Take 20 mg by mouth daily. , Disp: , Rfl:  .  Glycopyrrolate-Formoterol (BEVESPI AEROSPHERE) 9-4.8 MCG/ACT AERO, Inhale 2 puffs into the lungs 2 (two) times daily., Disp: , Rfl:  .  metoprolol succinate (TOPROL-XL) 25 MG 24 hr tablet, Take 25 mg by mouth daily., Disp: , Rfl:  .  OLODATEROL HCL IN, Inhale 2.5 mg into the lungs every morning. 2 puffs, Disp: , Rfl:  .  pentoxifylline (TRENTAL) 400 MG CR tablet, Take 400 mg by mouth 2 (two) times daily., Disp: , Rfl:  .  potassium chloride (KLOR-CON) 10 MEQ tablet, Take 1 tablet (10  mEq total) by mouth daily., Disp: 30 tablet, Rfl: 1 .  rOPINIRole (REQUIP) 0.25 MG tablet, Take 0.25 mg by mouth at bedtime. , Disp: , Rfl:  .  sodium chloride 1 g tablet, Take 1 g by mouth 3 (three) times daily., Disp: , Rfl:   Past Medical History: Past Medical History:  Diagnosis Date  . Aortic anomaly    aortic calcification  . Arrhythmia    a fib  . Cancer ([place])    lung  . Chronic obstructive lung disease ([place])   . Hypertension   . Ischemic cardiomyopathy   . Peripheral vascular disease ([place])   . Stroke [facility])    oct 2012    Tobacco Use: Social History   Tobacco Use  Smoking Status Light Tobacco Smoker  . Packs/day: 0.10  . Types: Cigarettes  Smokeless Tobacco Never Used  Tobacco Comment   pt smokes 1 cigarrete occasionally. 1 pack "last a few months"    Labs: Recent Review Flowsheet Data    There is no flowsheet data to display.      Capillary Blood Glucose: No results found for: GLUCAP   Pulmonary Assessment Scores: Pulmonary Assessment Scores    Row Name 02/22/19 1000 02/22/19 1030       ADL UCSD   ADL Phase  Entry  Entry    SOB Score total  45  --  CAT Score   CAT Score  22  --      mMRC Score   mMRC Score  --  2      UCSD: Self-administered rating of dyspnea associated with activities of daily living (ADLs) 6-point scale (0 = "not at all" to 5 = "maximal or unable to do because of breathlessness")  Scoring Scores range from 0 to 120.  Minimally important difference is 5 units  CAT: CAT can identify the health impairment of COPD patients and is better correlated with disease progression.  CAT has a scoring range of zero to 40. The CAT score is classified into four groups of low (less than 10), medium (10 - 20), high (21-30) and very high (31-40) based on the impact level of disease on health status. A CAT score over 10 suggests significant symptoms.  A worsening CAT score could be explained by an exacerbation, poor medication adherence,  poor inhaler technique, or progression of COPD or comorbid conditions.  CAT MCID is 2 points  mMRC: mMRC (Modified Medical Research Council) Dyspnea Scale is used to assess the degree of baseline functional disability in patients of respiratory disease due to dyspnea. No minimal important difference is established. A decrease in score of 1 point or greater is considered a positive change.   Pulmonary Function Assessment: Pulmonary Function Assessment - 02/22/19 0950      Breath   Bilateral Breath Sounds  Clear;Decreased    Shortness of Breath  Yes;Fear of Shortness of Breath;Limiting activity;Panic with Shortness of Breath       Exercise Target Goals: Exercise Program Goal: Individual exercise prescription set using results from initial 6 min walk test and THRR while considering  patient's activity barriers and safety.   Exercise Prescription Goal: Initial exercise prescription builds to 30-45 minutes a day of aerobic activity, 2-3 days per week.  Home exercise guidelines will be given to patient during program as part of exercise prescription that the participant will acknowledge.  Activity Barriers & Risk Stratification: Activity Barriers & Cardiac Risk Stratification - 02/22/19 0945      Activity Barriers & Cardiac Risk Stratification   Activity Barriers  Shortness of Breath;Muscular Weakness;Deconditioning;Back Problems       6 Minute Walk: 6 Minute Walk    Row Name 02/22/19 1030         6 Minute Walk   Phase  Initial     Distance  1055 feet     Walk Time  6 minutes     # of Rest Breaks  0     MPH  2     METS  3.02     RPE  12     Perceived Dyspnea   3     VO2 Peak  10.56     Symptoms  Yes (comment)     Comments  claudication in both calves     Resting HR  75 bpm     Resting BP  112/58     Resting Oxygen Saturation   98 %     Exercise Oxygen Saturation  during 6 min walk  94 %     Max Ex. HR  96 bpm     Max Ex. BP  140/68     2 Minute Post BP  108/64        Interval HR   1 Minute HR  96     2 Minute HR  96     3 Minute HR  95  4 Minute HR  94     5 Minute HR  95     6 Minute HR  95     2 Minute Post HR  85     Interval Heart Rate?  Yes       Interval Oxygen   Interval Oxygen?  Yes     Baseline Oxygen Saturation %  98 %     1 Minute Oxygen Saturation %  95 %     1 Minute Liters of Oxygen  0 L     2 Minute Oxygen Saturation %  94 %     2 Minute Liters of Oxygen  0 L     3 Minute Oxygen Saturation %  96 %     3 Minute Liters of Oxygen  0 L     4 Minute Oxygen Saturation %  94 %     4 Minute Liters of Oxygen  0 L     5 Minute Oxygen Saturation %  94 %     5 Minute Liters of Oxygen  0 L     6 Minute Oxygen Saturation %  95 %     6 Minute Liters of Oxygen  0 L     2 Minute Post Oxygen Saturation %  97 %     2 Minute Post Liters of Oxygen  0 L        Oxygen Initial Assessment: Oxygen Initial Assessment - 02/22/19 1029      Home Oxygen   Home Oxygen Device  Portable Concentrator;Home Concentrator;E-Tanks    Sleep Oxygen Prescription  Continuous    Home Exercise Oxygen Prescription  Pulsed    Liters per minute  2    Home at Rest Exercise Oxygen Prescription  None    Compliance with Home Oxygen Use  Yes      Initial 6 min Walk   Oxygen Used  None      Program Oxygen Prescription   Program Oxygen Prescription  None      Intervention   Short Term Goals  To learn and exhibit compliance with exercise, home and travel O2 prescription;To learn and understand importance of maintaining oxygen saturations>88%;To learn and demonstrate proper use of respiratory medications;To learn and understand importance of monitoring SPO2 with pulse oximeter and demonstrate accurate use of the pulse oximeter.;To learn and demonstrate proper pursed lip breathing techniques or other breathing techniques.    Long  Term Goals  Exhibits compliance with exercise, home and travel O2 prescription;Verbalizes importance of monitoring SPO2 with pulse oximeter  and return demonstration;Maintenance of O2 saturations>88%;Exhibits proper breathing techniques, such as pursed lip breathing or other method taught during program session;Compliance with respiratory medication;Demonstrates proper use of MDI's       Oxygen Re-Evaluation: Oxygen Re-Evaluation    Row Name 03/02/19 1147             Program Oxygen Prescription   Program Oxygen Prescription  None         Home Oxygen   Home Oxygen Device  Portable Concentrator;Home Concentrator;E-Tanks       Sleep Oxygen Prescription  Continuous       Liters per minute  2       Home Exercise Oxygen Prescription  Pulsed       Liters per minute  2       Home at Rest Exercise Oxygen Prescription  None       Compliance with Home Oxygen Use  Yes  Goals/Expected Outcomes   Short Term Goals  To learn and exhibit compliance with exercise, home and travel O2 prescription;To learn and understand importance of maintaining oxygen saturations>88%;To learn and demonstrate proper use of respiratory medications;To learn and understand importance of monitoring SPO2 with pulse oximeter and demonstrate accurate use of the pulse oximeter.;To learn and demonstrate proper pursed lip breathing techniques or other breathing techniques.       Long  Term Goals  Exhibits compliance with exercise, home and travel O2 prescription;Verbalizes importance of monitoring SPO2 with pulse oximeter and return demonstration;Maintenance of O2 saturations>88%;Exhibits proper breathing techniques, such as pursed lip breathing or other method taught during program session;Compliance with respiratory medication;Demonstrates proper use of MDI's       Goals/Expected Outcomes  compliance           Oxygen Discharge (Final Oxygen Re-Evaluation): Oxygen Re-Evaluation - 03/02/19 1147      Program Oxygen Prescription   Program Oxygen Prescription  None      Home Oxygen   Home Oxygen Device  Portable Concentrator;Home Concentrator;E-Tanks     Sleep Oxygen Prescription  Continuous    Liters per minute  2    Home Exercise Oxygen Prescription  Pulsed    Liters per minute  2    Home at Rest Exercise Oxygen Prescription  None    Compliance with Home Oxygen Use  Yes      Goals/Expected Outcomes   Short Term Goals  To learn and exhibit compliance with exercise, home and travel O2 prescription;To learn and understand importance of maintaining oxygen saturations>88%;To learn and demonstrate proper use of respiratory medications;To learn and understand importance of monitoring SPO2 with pulse oximeter and demonstrate accurate use of the pulse oximeter.;To learn and demonstrate proper pursed lip breathing techniques or other breathing techniques.    Long  Term Goals  Exhibits compliance with exercise, home and travel O2 prescription;Verbalizes importance of monitoring SPO2 with pulse oximeter and return demonstration;Maintenance of O2 saturations>88%;Exhibits proper breathing techniques, such as pursed lip breathing or other method taught during program session;Compliance with respiratory medication;Demonstrates proper use of MDI's    Goals/Expected Outcomes  compliance        Initial Exercise Prescription: Initial Exercise Prescription - 02/22/19 1000      Date of Initial Exercise RX and Referring Provider   Date  02/22/19    Referring Provider  Dr. Gwenette Greet      Treadmill   MPH  1.7    Grade  1    Minutes  15      Bike   Level  2    Minutes  15      Prescription Details   Frequency (times per week)  2    Duration  Progress to 30 minutes of continuous aerobic without signs/symptoms of physical distress      Intensity   THRR 40-80% of Max Heartrate  58-116    Ratings of Perceived Exertion  11-13    Perceived Dyspnea  0-4      Progression   Progression  Continue to progress workloads to maintain intensity without signs/symptoms of physical distress.      Resistance Training   Training Prescription  Yes    Weight  orange  bands    Reps  10-15       Perform Capillary Blood Glucose checks as needed.  Exercise Prescription Changes: Exercise Prescription Changes    Row Name 03/02/19 1100  Response to Exercise   Blood Pressure (Admit)  108/52       Blood Pressure (Exercise)  144/64       Blood Pressure (Exit)  102/64       Heart Rate (Admit)  85 bpm       Heart Rate (Exercise)  94 bpm       Heart Rate (Exit)  74 bpm       Oxygen Saturation (Admit)  98 %       Oxygen Saturation (Exercise)  97 %       Oxygen Saturation (Exit)  100 %       Rating of Perceived Exertion (Exercise)  13       Perceived Dyspnea (Exercise)  3       Duration  Continue with 30 min of aerobic exercise without signs/symptoms of physical distress.       Intensity  THRR unchanged         Resistance Training   Training Prescription  Yes       Weight  orange bands       Reps  10-15       Time  10 Minutes         Bike   Level  1       Minutes  15       METs  2.1         NuStep   Level  2       SPM  80       Minutes  15       METs  2.1          Exercise Comments:   Exercise Goals and Review: Exercise Goals    Row Name 02/22/19 1048 03/02/19 1147           Exercise Goals   Increase Physical Activity  Yes  Yes      Intervention  Provide advice, education, support and counseling about physical activity/exercise needs.;Develop an individualized exercise prescription for aerobic and resistive training based on initial evaluation findings, risk stratification, comorbidities and participant's personal goals.  Provide advice, education, support and counseling about physical activity/exercise needs.;Develop an individualized exercise prescription for aerobic and resistive training based on initial evaluation findings, risk stratification, comorbidities and participant's personal goals.      Expected Outcomes  Short Term: Attend rehab on a regular basis to increase amount of physical activity.;Long Term: Add in  home exercise to make exercise part of routine and to increase amount of physical activity.;Long Term: Exercising regularly at least 3-5 days a week.  Short Term: Attend rehab on a regular basis to increase amount of physical activity.;Long Term: Add in home exercise to make exercise part of routine and to increase amount of physical activity.;Long Term: Exercising regularly at least 3-5 days a week.      Increase Strength and Stamina  Yes  Yes      Intervention  Provide advice, education, support and counseling about physical activity/exercise needs.;Develop an individualized exercise prescription for aerobic and resistive training based on initial evaluation findings, risk stratification, comorbidities and participant's personal goals.  Provide advice, education, support and counseling about physical activity/exercise needs.;Develop an individualized exercise prescription for aerobic and resistive training based on initial evaluation findings, risk stratification, comorbidities and participant's personal goals.      Expected Outcomes  Short Term: Increase workloads from initial exercise prescription for resistance, speed, and METs.;Short Term: Perform resistance training exercises routinely during rehab and  add in resistance training at home;Long Term: Improve cardiorespiratory fitness, muscular endurance and strength as measured by increased METs and functional capacity (6MWT)  Short Term: Increase workloads from initial exercise prescription for resistance, speed, and METs.;Short Term: Perform resistance training exercises routinely during rehab and add in resistance training at home;Long Term: Improve cardiorespiratory fitness, muscular endurance and strength as measured by increased METs and functional capacity (6MWT)      Able to understand and use rate of perceived exertion (RPE) scale  Yes  Yes      Intervention  Provide education and explanation on how to use RPE scale  Provide education and  explanation on how to use RPE scale      Expected Outcomes  Short Term: Able to use RPE daily in rehab to express subjective intensity level;Long Term:  Able to use RPE to guide intensity level when exercising independently  Short Term: Able to use RPE daily in rehab to express subjective intensity level;Long Term:  Able to use RPE to guide intensity level when exercising independently      Able to understand and use Dyspnea scale  Yes  Yes      Intervention  Provide education and explanation on how to use Dyspnea scale  Provide education and explanation on how to use Dyspnea scale      Expected Outcomes  Short Term: Able to use Dyspnea scale daily in rehab to express subjective sense of shortness of breath during exertion;Long Term: Able to use Dyspnea scale to guide intensity level when exercising independently  Short Term: Able to use Dyspnea scale daily in rehab to express subjective sense of shortness of breath during exertion;Long Term: Able to use Dyspnea scale to guide intensity level when exercising independently      Knowledge and understanding of Target Heart Rate Range (THRR)  Yes  Yes      Intervention  Provide education and explanation of THRR including how the numbers were predicted and where they are located for reference  Provide education and explanation of THRR including how the numbers were predicted and where they are located for reference      Expected Outcomes  Short Term: Able to state/look up THRR;Long Term: Able to use THRR to govern intensity when exercising independently;Short Term: Able to use daily as guideline for intensity in rehab  Short Term: Able to state/look up THRR;Long Term: Able to use THRR to govern intensity when exercising independently;Short Term: Able to use daily as guideline for intensity in rehab      Understanding of Exercise Prescription  Yes  Yes      Intervention  Provide education, explanation, and written materials on patient's individual exercise  prescription  Provide education, explanation, and written materials on patient's individual exercise prescription      Expected Outcomes  Short Term: Able to explain program exercise prescription;Long Term: Able to explain home exercise prescription to exercise independently  Short Term: Able to explain program exercise prescription;Long Term: Able to explain home exercise prescription to exercise independently      Improve claudication pain toleration; Improve walking ability  Yes  Yes      Intervention  Participate in PAD/SET Rehab 2-3 days a week, walking at home as part of exercise prescription;Attend education sessions to aid in risk factor modification and understanding of disease process  Participate in PAD/SET Rehab 2-3 days a week, walking at home as part of exercise prescription;Attend education sessions to aid in risk factor modification and understanding of  disease process      Expected Outcomes  Short Term: Improve walking distance/time to onset of claudication pain;Long Term: Improve walking ability and toleration to claudication;Long Term: Improve score of PAD questionnaires  Short Term: Improve walking distance/time to onset of claudication pain;Long Term: Improve walking ability and toleration to claudication;Long Term: Improve score of PAD questionnaires         Exercise Goals Re-Evaluation : Exercise Goals Re-Evaluation    [place] Name 03/02/19 1148             Exercise Goal Re-Evaluation   Exercise Goals Review  Increase Physical Activity;Able to understand and use rate of perceived exertion (RPE) scale;Increase Strength and Stamina;Able to understand and use Dyspnea scale;Knowledge and understanding of Target Heart Rate Range (THRR);Understanding of Exercise Prescription       Comments  Pt completed first exercise session. Pt was unable to walk on the TM due to fatigue. Pt exercised at 2.1 METs on the stepper. Will continue to monitor and progress as able.       Expected Outcomes   Through exercise at rehab and at home, the patient will decrease shortness of breath with daily activities and feel confident in carrying out an exercise regime at home.           Discharge Exercise Prescription (Final Exercise Prescription Changes): Exercise Prescription Changes - 03/02/19 1100      Response to Exercise   Blood Pressure (Admit)  108/52    Blood Pressure (Exercise)  144/64    Blood Pressure (Exit)  102/64    Heart Rate (Admit)  85 bpm    Heart Rate (Exercise)  94 bpm    Heart Rate (Exit)  74 bpm    Oxygen Saturation (Admit)  98 %    Oxygen Saturation (Exercise)  97 %    Oxygen Saturation (Exit)  100 %    Rating of Perceived Exertion (Exercise)  13    Perceived Dyspnea (Exercise)  3    Duration  Continue with 30 min of aerobic exercise without signs/symptoms of physical distress.    Intensity  THRR unchanged      Resistance Training   Training Prescription  Yes    Weight  orange bands    Reps  10-15    Time  10 Minutes      Bike   Level  1    Minutes  15    METs  2.1      NuStep   Level  2    SPM  80    Minutes  15    METs  2.1       Nutrition:  Target Goals: Understanding of nutrition guidelines, daily intake of sodium 1500mg , cholesterol 200mg , calories 30% from fat and 7% or less from saturated fats, daily to have 5 or more servings of fruits and vegetables.  Biometrics: Pre Biometrics - 02/22/19 0948      Pre Biometrics   Height  5\' 11"  (1.803 m)    Weight  53.1 kg    BMI (Calculated)  16.33    Grip Strength  24 kg        Nutrition Therapy Plan and Nutrition Goals:   Nutrition Assessments:   Nutrition Goals Re-Evaluation:   Nutrition Goals Discharge (Final Nutrition Goals Re-Evaluation):   Psychosocial: Target Goals: Acknowledge presence or absence of significant depression and/or stress, maximize coping skills, provide positive support system. Participant is able to verbalize types and ability to use techniques and skills  needed for reducing stress and depression.  Initial Review & Psychosocial Screening: Initial Psych Review & Screening - 02/22/19 1001      Initial Review   Current issues with  None Identified      Family Dynamics   Good Support System?  Yes      Barriers   Psychosocial barriers to participate in program  There are no identifiable barriers or psychosocial needs.      Screening Interventions   Interventions  Encouraged to exercise       Quality of Life Scores:  Scores of 19 and below usually indicate a poorer quality of life in these areas.  A difference of  2-3 points is a clinically meaningful difference.  A difference of 2-3 points in the total score of the Quality of Life Index has been associated with significant improvement in overall quality of life, self-image, physical symptoms, and general health in studies assessing change in quality of life.  PHQ-9: Recent Review Flowsheet Data    Depression screen [facility] 2/9 02/22/2019 03/13/2018 03/13/2018   Decreased Interest 0 0 0   Down, Depressed, Hopeless 0 0 0   PHQ - 2 Score 0 0 0   Altered sleeping 1 0 0   Tired, decreased energy 0 3 3   Change in appetite 0 3 3   Feeling bad or failure about yourself  0 0 0   Trouble concentrating 0 0 0   Moving slowly or fidgety/restless 0 0 0   Suicidal thoughts 0 0 0   PHQ-9 Score - 6 6   Difficult doing work/chores Not difficult at all Somewhat difficult Somewhat difficult     Interpretation of Total Score  Total Score Depression Severity:  1-4 = Minimal depression, 5-9 = Mild depression, 10-14 = Moderate depression, 15-19 = Moderately severe depression, 20-27 = Severe depression   Psychosocial Evaluation and Intervention: Psychosocial Evaluation - 02/22/19 1001      Psychosocial Evaluation & Interventions   Interventions  Encouraged to exercise with the program and follow exercise prescription    Continue Psychosocial Services   No Follow up required       Psychosocial  Re-Evaluation: Psychosocial Re-Evaluation    [place] Name 02/22/19 1001 03/02/19 1150           Psychosocial Re-Evaluation   Current issues with  None Identified  None Identified      Interventions  Encouraged to attend Pulmonary Rehabilitation for the exercise  Encouraged to attend Pulmonary Rehabilitation for the exercise      Continue Psychosocial Services   No Follow up required  No Follow up required         Psychosocial Discharge (Final Psychosocial Re-Evaluation): Psychosocial Re-Evaluation - 03/02/19 1150      Psychosocial Re-Evaluation   Current issues with  None Identified    Interventions  Encouraged to attend Pulmonary Rehabilitation for the exercise    Continue Psychosocial Services   No Follow up required       Education: Education Goals: Education classes will be provided on a weekly basis, covering required topics. Participant will state understanding/return demonstration of topics presented.  Learning Barriers/Preferences: Learning Barriers/Preferences - 02/22/19 1002      Learning Barriers/Preferences   Learning Barriers  None    Learning Preferences  Audio;Computer/Internet;Group Instruction;Individual Instruction;Pictoral;Skilled Demonstration;Verbal Instruction;Video;Written Material       Education Topics: Risk Factor Reduction:  -Group instruction that is supported by a PowerPoint presentation. Instructor discusses the definition of a risk factor, different  risk factors for pulmonary disease, and how the heart and lungs work together.     Nutrition for Pulmonary Patient:  -Group instruction provided by PowerPoint slides, verbal discussion, and written materials to support subject matter. The instructor gives an explanation and review of healthy diet recommendations, which includes a discussion on weight management, recommendations for fruit and vegetable consumption, as well as protein, fluid, caffeine, fiber, sodium, sugar, and alcohol. Tips for eating  when patients are short of breath are discussed.   PULMONARY REHAB CHRONIC OBSTRUCTIVE PULMONARY DISEASE from 05/28/2018 in [place]  Date  03/26/18  Educator  Rodman Pickle  Instruction Review Code  1- Verbalizes Understanding      Pursed Lip Breathing:  -Group instruction that is supported by demonstration and informational handouts. Instructor discusses the benefits of pursed lip and diaphragmatic breathing and detailed demonstration on how to preform both.     Oxygen Safety:  -Group instruction provided by PowerPoint, verbal discussion, and written material to support subject matter. There is an overview of "What is Oxygen" and "Why do we need it".  Instructor also reviews how to create a safe environment for oxygen use, the importance of using oxygen as prescribed, and the risks of noncompliance. There is a brief discussion on traveling with oxygen and resources the patient may utilize.   PULMONARY REHAB CHRONIC OBSTRUCTIVE PULMONARY DISEASE from 05/28/2018 in [place]  Date  04/23/18  Educator  Cloyde Reams  Instruction Review Code  1- Verbalizes Understanding      Oxygen Equipment:  -Group instruction provided by [employer] utilizing handouts, written materials, and Insurance underwriter.   Signs and Symptoms:  -Group instruction provided by written material and verbal discussion to support subject matter. Warning signs and symptoms of infection, stroke, and heart attack are reviewed and when to call the physician/911 reinforced. Tips for preventing the spread of infection discussed.   PULMONARY REHAB CHRONIC OBSTRUCTIVE PULMONARY DISEASE from 05/28/2018 in [place]  Date  05/14/18  Educator  Remo Lipps  Instruction Review Code  1- Verbalizes Understanding      Advanced Directives:  -Group instruction provided by verbal instruction and written material to support subject matter.  Instructor reviews Advanced Directive laws and proper instruction for filling out document.   Pulmonary Video:  -Group video education that reviews the importance of medication and oxygen compliance, exercise, good nutrition, pulmonary hygiene, and pursed lip and diaphragmatic breathing for the pulmonary patient.   Exercise for the Pulmonary Patient:  -Group instruction that is supported by a PowerPoint presentation. Instructor discusses benefits of exercise, core components of exercise, frequency, duration, and intensity of an exercise routine, importance of utilizing pulse oximetry during exercise, safety while exercising, and options of places to exercise outside of rehab.     PULMONARY REHAB CHRONIC OBSTRUCTIVE PULMONARY DISEASE from 05/28/2018 in [place]  Date  05/07/18  Educator  Kirk Ruths  Instruction Review Code  1- Verbalizes Understanding      Pulmonary Medications:  -Verbally interactive group education provided by instructor with focus on inhaled medications and proper administration.   PULMONARY REHAB CHRONIC OBSTRUCTIVE PULMONARY DISEASE from 05/28/2018 in [place]  Date  04/07/18  Educator  pharmacy  Instruction Review Code  1- Verbalizes Understanding      Anatomy and Physiology of the Respiratory System and Intimacy:  -Group instruction provided by PowerPoint, verbal discussion, and written material to support  subject matter. Instructor reviews respiratory cycle and anatomical components of the respiratory system and their functions. Instructor also reviews differences in obstructive and restrictive respiratory diseases with examples of each. Intimacy, Sex, and Sexuality differences are reviewed with a discussion on how relationships can change when diagnosed with pulmonary disease. Common sexual concerns are reviewed.   PULMONARY REHAB CHRONIC OBSTRUCTIVE PULMONARY DISEASE from 05/28/2018 in [place]  Date  05/28/18  Educator  rn  Instruction Review Code  1- Verbalizes Understanding      MD DAY -A group question and answer session with a medical doctor that allows participants to ask questions that relate to their pulmonary disease state.   OTHER EDUCATION -Group or individual verbal, written, or video instructions that support the educational goals of the pulmonary rehab program.   PULMONARY REHAB CHRONIC OBSTRUCTIVE PULMONARY DISEASE from 05/28/2018 in [place]  Date  04/30/18  Educator  Iona Beard [[employer]]  Instruction Review Code  1- Verbalizes Understanding      Holiday Eating Survival Tips:  -Group instruction provided by PowerPoint slides, verbal discussion, and written materials to support subject matter. The instructor gives patients tips, tricks, and techniques to help them not only survive but enjoy the holidays despite the onslaught of food that accompanies the holidays.   Knowledge Questionnaire Score: Knowledge Questionnaire Score - 02/22/19 1002      Knowledge Questionnaire Score   Pre Score  16/18       Core Components/Risk Factors/Patient Goals at Admission: Personal Goals and Risk Factors at Admission - 02/22/19 1002      Core Components/Risk Factors/Patient Goals on Admission    Weight Management  Weight Gain    Improve shortness of breath with ADL's  Yes    Intervention  Provide education, individualized exercise plan and daily activity instruction to help decrease symptoms of SOB with activities of daily living.    Expected Outcomes  Short Term: Improve cardiorespiratory fitness to achieve a reduction of symptoms when performing ADLs;Long Term: Be able to perform more ADLs without symptoms or delay the onset of symptoms       Core Components/Risk Factors/Patient Goals Review:  Goals and Risk Factor Review    Row Name 02/22/19 1003 03/02/19 1151           Core Components/Risk  Factors/Patient Goals Review   Personal Goals Review  Improve shortness of breath with ADL's;Increase knowledge of respiratory medications and ability to use respiratory devices properly.;Develop more efficient breathing techniques such as purse lipped breathing and diaphragmatic breathing and practicing self-pacing with activity.;Weight Management/Obesity weight gain needed  Weight Management/Obesity;Develop more efficient breathing techniques such as purse lipped breathing and diaphragmatic breathing and practicing self-pacing with activity.;Increase knowledge of respiratory medications and ability to use respiratory devices properly.;Improve shortness of breath with ADL's      Review  --  Just started program today, is very deconditioned, unable to walk on the treadmill for a sustained 15 minutes, changed equipment to upright bike and nustep.  Also suggested he should eat breakfast before exercising.      Expected Outcomes  --  See admission goals.         Core Components/Risk Factors/Patient Goals at Discharge (Final Review):  Goals and Risk Factor Review - 03/02/19 1151      Core Components/Risk Factors/Patient Goals Review   Personal Goals Review  Weight Management/Obesity;Develop more efficient breathing techniques such as purse lipped breathing and diaphragmatic breathing and practicing self-pacing  with activity.;Increase knowledge of respiratory medications and ability to use respiratory devices properly.;Improve shortness of breath with ADL's    Review  Just started program today, is very deconditioned, unable to walk on the treadmill for a sustained 15 minutes, changed equipment to upright bike and nustep.  Also suggested he should eat breakfast before exercising.    Expected Outcomes  See admission goals.       ITP Comments:   Comments: ITP REVIEW Pt is making expected progress toward pulmonary rehab goals after completing 1 sessions. Recommend continued exercise, life style  modification, education, and utilization of breathing techniques to increase stamina and strength and decrease shortness of breath with exertion.

## 2019-03-04 ENCOUNTER — Other Ambulatory Visit: Payer: Self-pay

## 2019-03-04 ENCOUNTER — Encounter (HOSPITAL_COMMUNITY)
Admission: RE | Admit: 2019-03-04 | Discharge: 2019-03-04 | Disposition: A | Discharge status: Home / Self Care | Payer: No Typology Code available for payment source | Source: Ambulatory Visit | Attending: Pulmonary Disease | Admitting: Pulmonary Disease

## 2019-03-04 DIAGNOSIS — J449 Chronic obstructive pulmonary disease, unspecified: Secondary | ICD-10-CM | POA: Diagnosis not present

## 2019-03-04 NOTE — Progress Notes (Signed)
Brett Saunders 73 y.o. male Nutrition Note  Visit Diagnosis: Chronic obstructive pulmonary disease, unspecified COPD type ([place])   Past Medical History:  Diagnosis Date  . Aortic anomaly    aortic calcification  . Arrhythmia    a fib  . Cancer ([place])    lung  . Chronic obstructive lung disease ([place])   . Hypertension   . Ischemic cardiomyopathy   . Peripheral vascular disease ([place])   . Stroke [facility])    oct 2012     Medications reviewed.   Current Outpatient Medications:  .  acetaminophen (TYLENOL) 500 MG tablet, Take 500 mg by mouth 2 (two) times daily., Disp: , Rfl:  .  albuterol (PROVENTIL) (2.5 MG/3ML) 0.083% nebulizer solution, Take 2.5 mg by nebulization every 6 (six) hours as needed for wheezing or shortness of breath., Disp: , Rfl:  .  amiodarone (PACERONE) 200 MG tablet, Take 2 tablets (400 mg total) by mouth 2 (two) times daily. 2 tab twice daily for 2 weeks, 1 tablet twice daily for two weeks then one tablet daily, Disp: 90 tablet, Rfl: 1 .  apixaban (ELIQUIS) 5 MG TABS tablet, Take 2.5 mg by mouth 2 (two) times daily., Disp: , Rfl:  .  carvedilol (COREG) 3.125 MG tablet, Take 3.125 mg by mouth 2 (two) times daily with a meal., Disp: , Rfl:  .  diphenhydrAMINE (BENADRYL) 25 MG tablet, Take 25 mg by mouth at bedtime., Disp: , Rfl:  .  furosemide (LASIX) 20 MG tablet, Take 20 mg by mouth daily. , Disp: , Rfl:  .  Glycopyrrolate-Formoterol (BEVESPI AEROSPHERE) 9-4.8 MCG/ACT AERO, Inhale 2 puffs into the lungs 2 (two) times daily., Disp: , Rfl:  .  metoprolol succinate (TOPROL-XL) 25 MG 24 hr tablet, Take 25 mg by mouth daily., Disp: , Rfl:  .  OLODATEROL HCL IN, Inhale 2.5 mg into the lungs every morning. 2 puffs, Disp: , Rfl:  .  pentoxifylline (TRENTAL) 400 MG CR tablet, Take 400 mg by mouth 2 (two) times daily., Disp: , Rfl:  .  potassium chloride (KLOR-CON) 10 MEQ tablet, Take 1 tablet (10 mEq total) by mouth daily., Disp: 30 tablet, Rfl: 1 .  rOPINIRole (REQUIP) 0.25 MG  tablet, Take 0.25 mg by mouth at bedtime. , Disp: , Rfl:  .  sodium chloride 1 g tablet, Take 1 g by mouth 3 (three) times daily., Disp: , Rfl:    Ht Readings from Last 1 Encounters:  02/22/19 5\' 11"  (1.803 m)     Wt Readings from Last 3 Encounters:  03/02/19 118 lb 9.7 oz (53.8 kg)  02/22/19 117 lb 1 oz (53.1 kg)  12/22/18 115 lb (52.2 kg)     There is no height or weight on file to calculate BMI.   Social History   Tobacco Use  Smoking Status Light Tobacco Smoker  . Packs/day: 0.10  . Types: Cigarettes  Smokeless Tobacco Never Used  Tobacco Comment   pt smokes 1 cigarrete occasionally. 1 pack "last a few months"     No results found for: CHOL No results found for: HDL No results found for: LDLCALC No results found for: TRIG No results found for: CHOLHDL   No results found for: HGBA1C   CBG (last 3)  No results for input(s): GLUCAP in the last 72 hours.   Nutrition Note  Spoke with pt. Nutrition Plan and Nutrition Survey goals reviewed with pt.  Pt has a good appetite. Pt reports medicines adjust by MD and now he  is feeling hungry all the time. He reports gaining a few lbs. Discussed hx of unintentional weight loss. He received 3 crates of Ensure from the [place] and is currently trying to drink at least 2. He was told to drink 3-4 per day. Discussed ways to increase calories to meals and snacks. Provided plan based on pt's preferences. Pt verbalized understanding and agreeable to making changes.   Nutrition Diagnosis ? Increased energy expenditure related to increased energy requirements during COPD exacerbation as evidenced by BMI <20 and recent h/o wt loss.  Nutrition Intervention ? Pt's individual nutrition plan reviewed with pt. ? Continue client-centered nutrition education by RD, as part of interdisciplinary care.  Goal(s) ? Pt to drink 3 ensures/day and add fats to meals and snacks ? Identify food quantities necessary to achieve wt gain of  -2 lbs per week  to a goal wt gain of 5-24 lb at graduation from pulmonary rehab.  Plan:   Will provide client-centered nutrition education as part of interdisciplinary care  Monitor and evaluate progress toward nutrition goal with team.   Michaele Offer, MS, RDN, LDN

## 2019-03-04 NOTE — Progress Notes (Signed)
Daily Session Note  Patient Details  Name: Brett Saunders MRN: [id number] Date of Birth: 07/25/1945 Referring Provider:     Pulmonary Rehab Walk Test from 02/22/2019 in [place]  Referring Provider  Dr. Gwenette Greet      Encounter Date: 03/04/2019  Check In: Session Check In - 03/04/19 1043      Check-In   Supervising physician immediately available to respond to emergencies  [facility] immediately available    Physician(s)  Dr. Sloan Leiter    Location  [facility]    Staff Present  Rosebud Poles, RN, Bjorn Loser, MS, Exercise Physiologist;Lisa Ysidro Evert, RN    Virtual Visit  No    Medication changes reported      No    Fall or balance concerns reported     No    Tobacco Cessation  No Change    Warm-up and Cool-down  Performed on first and last piece of equipment    Resistance Training Performed  Yes    VAD Patient?  No    PAD/SET Patient?  No      Pain Assessment   Currently in Pain?  No/denies    Multiple Pain Sites  No       Capillary Blood Glucose: No results found for this or any previous visit (from the past 24 hour(s)).    Social History   Tobacco Use  Smoking Status Light Tobacco Smoker  . Packs/day: 0.10  . Types: Cigarettes  Smokeless Tobacco Never Used  Tobacco Comment   pt smokes 1 cigarrete occasionally. 1 pack "last a few months"    Goals Met:  Proper associated with RPD/PD & O2 Sat Exercise tolerated well Strength training completed today  Goals Unmet:  Not Applicable  Comments: Service time is from 1000 to 1122.    Dr. Rush Farmer is Medical Director for Pulmonary Rehab at [facility].

## 2019-03-05 ENCOUNTER — Other Ambulatory Visit: Payer: Self-pay | Admitting: Cardiology

## 2019-03-09 ENCOUNTER — Encounter (HOSPITAL_COMMUNITY)
Admission: RE | Admit: 2019-03-09 | Discharge: 2019-03-09 | Disposition: A | Discharge status: Home / Self Care | Payer: No Typology Code available for payment source | Source: Ambulatory Visit | Attending: Pulmonary Disease | Admitting: Pulmonary Disease

## 2019-03-09 ENCOUNTER — Other Ambulatory Visit: Payer: Self-pay

## 2019-03-09 DIAGNOSIS — J449 Chronic obstructive pulmonary disease, unspecified: Secondary | ICD-10-CM

## 2019-03-09 NOTE — Progress Notes (Signed)
I have reviewed a Home Exercise Prescription with Brett Saunders . Brett Saunders is not currently exercising at home.  The patient was advised to walk, and use the stepper and bike 3-5 days a week for 30-45 minutes.  Brett Saunders and I discussed how to progress their exercise prescription.  The patient stated that their goals were to improve breathing and .  The patient stated that they understand the exercise prescription.  We reviewed exercise guidelines, target heart rate during exercise, RPE Scale, weather conditions, NTG use, endpoints for exercise, warmup and cool down.  Patient is encouraged to come to me with any questions. I will continue to follow up with the patient to assist them with progression and safety.

## 2019-03-09 NOTE — Progress Notes (Signed)
Daily Session Note  Patient Details  Name: Brett Saunders MRN: [id number] Date of Birth: 1945/05/30 Referring Provider:     Pulmonary Rehab Walk Test from 02/22/2019 in [place]  Referring Provider  Dr. Gwenette Greet      Encounter Date: 03/09/2019  Check In: Session Check In - 03/09/19 1104      Check-In   Supervising physician immediately available to respond to emergencies  [facility] immediately available    Physician(s)  Dr. Nevada Crane    Location  [facility]    Staff Present  Rosebud Poles, RN, Bjorn Loser, MS, Exercise Physiologist;Jaleeah Slight Ysidro Evert, RN    Virtual Visit  No    Medication changes reported      No    Fall or balance concerns reported     No    Tobacco Cessation  No Change    Warm-up and Cool-down  Performed on first and last piece of equipment    Resistance Training Performed  Yes    VAD Patient?  No    PAD/SET Patient?  No      Pain Assessment   Currently in Pain?  No/denies    Multiple Pain Sites  No       Capillary Blood Glucose: No results found for this or any previous visit (from the past 24 hour(s)).    Social History   Tobacco Use  Smoking Status Light Tobacco Smoker  . Packs/day: 0.10  . Types: Cigarettes  Smokeless Tobacco Never Used  Tobacco Comment   pt smokes 1 cigarrete occasionally. 1 pack "last a few months"    Goals Met:  Exercise tolerated well No report of cardiac concerns or symptoms Strength training completed today  Goals Unmet:  Not Applicable  Comments: Service time is from 1000 to 1055    Dr. Rush Farmer is Medical Director for Pulmonary Rehab at [facility].

## 2019-03-11 ENCOUNTER — Telehealth (HOSPITAL_COMMUNITY): Payer: Self-pay | Admitting: Family Medicine

## 2019-03-11 ENCOUNTER — Encounter (HOSPITAL_COMMUNITY): Payer: No Typology Code available for payment source

## 2019-03-16 ENCOUNTER — Other Ambulatory Visit: Payer: Self-pay

## 2019-03-16 ENCOUNTER — Encounter (HOSPITAL_COMMUNITY)
Admission: RE | Admit: 2019-03-16 | Discharge: 2019-03-16 | Disposition: A | Discharge status: Home / Self Care | Payer: No Typology Code available for payment source | Source: Ambulatory Visit | Attending: Pulmonary Disease | Admitting: Pulmonary Disease

## 2019-03-16 VITALS — Wt 119.7 lb

## 2019-03-16 DIAGNOSIS — J449 Chronic obstructive pulmonary disease, unspecified: Secondary | ICD-10-CM | POA: Diagnosis not present

## 2019-03-16 NOTE — Progress Notes (Signed)
Daily Session Note  Patient Details  Name: Brett Saunders MRN: [id number] Date of Birth: 1945/09/18 Referring Provider:     Pulmonary Rehab Walk Test from 02/22/2019 in [place]  Referring Provider  Dr. Gwenette Greet      Encounter Date: 03/16/2019  Check In: Session Check In - 03/16/19 1118      Check-In   Supervising physician immediately available to respond to emergencies  [facility] immediately available    Physician(s)  Dr. Nevada Crane    Location  [facility]    Staff Present  Rosebud Poles, RN, Bjorn Loser, MS, Exercise Physiologist;Catlyn Shipton Ysidro Evert, RN    Virtual Visit  No    Medication changes reported      No    Fall or balance concerns reported     No    Tobacco Cessation  No Change    Warm-up and Cool-down  Performed on first and last piece of equipment    Resistance Training Performed  Yes    VAD Patient?  No    PAD/SET Patient?  No      Pain Assessment   Currently in Pain?  No/denies    Multiple Pain Sites  No       Capillary Blood Glucose: No results found for this or any previous visit (from the past 24 hour(s)).  Exercise Prescription Changes - 03/16/19 1100      Response to Exercise   Blood Pressure (Admit)  110/50    Blood Pressure (Exercise)  132/64    Blood Pressure (Exit)  104/68    Heart Rate (Admit)  68 bpm    Heart Rate (Exercise)  90 bpm    Heart Rate (Exit)  68 bpm    Oxygen Saturation (Admit)  100 %    Oxygen Saturation (Exercise)  98 %    Oxygen Saturation (Exit)  100 %    Rating of Perceived Exertion (Exercise)  12    Perceived Dyspnea (Exercise)  3    Duration  Continue with 30 min of aerobic exercise without signs/symptoms of physical distress.    Intensity  THRR unchanged      Progression   Progression  Continue to progress workloads to maintain intensity without signs/symptoms of physical distress.      Resistance Training   Training Prescription  Yes    Weight  orange bands    Reps  10-15    Time  10 Minutes      Bike   Level  1.5    Minutes  15      NuStep   Level  2    SPM  80    Minutes  15    METs  1.8       Social History   Tobacco Use  Smoking Status Light Tobacco Smoker  . Packs/day: 0.10  . Types: Cigarettes  Smokeless Tobacco Never Used  Tobacco Comment   pt smokes 1 cigarrete occasionally. 1 pack "last a few months"    Goals Met:  Exercise tolerated well No report of cardiac concerns or symptoms Strength training completed today  Goals Unmet:  Not Applicable  Comments: Service time is from 1000 to 1100    Dr. Rush Farmer is Medical Director for Pulmonary Rehab at [facility].

## 2019-03-18 ENCOUNTER — Other Ambulatory Visit: Payer: Self-pay

## 2019-03-18 ENCOUNTER — Encounter (HOSPITAL_COMMUNITY)
Admission: RE | Admit: 2019-03-18 | Discharge: 2019-03-18 | Disposition: A | Discharge status: Home / Self Care | Payer: No Typology Code available for payment source | Source: Ambulatory Visit | Attending: Pulmonary Disease | Admitting: Pulmonary Disease

## 2019-03-18 DIAGNOSIS — J449 Chronic obstructive pulmonary disease, unspecified: Secondary | ICD-10-CM

## 2019-03-18 NOTE — Progress Notes (Signed)
         Confirm Consent - In the setting of the current Covid19 crisis, you are scheduled for a phone visit with your Cardiac or Pulmonary team member.  Just as we do with many in-gym visits, in order for you to participate in this visit, we must obtain consent.  If you'd like, I can send this to your mychart (if signed up) or email for you to review.  Otherwise, I can obtain your verbal consent now.  By agreeing to a telephone visit, we'd like you to understand that the technology does not allow for your Cardiac or Pulmonary Rehab team member to perform a physical assessment, and thus may limit their ability to fully assess your ability to perform exercise programs. If your provider identifies any concerns that need to be evaluated in person, we will make arrangements to do so.  Finally, though the technology is pretty good, we cannot assure that it will always work on either your or our end and we cannot ensure that we have a secure connection.  Cardiac and Pulmonary Rehab Telehealth visits and "At Home" cardiac and pulmonary rehab are provided at no cost to you.        Are you willing to proceed?"        STAFF: Did the patient verbally acknowledge consent to telehealth visit? Document YES/NO here: Yes     Rosebud Poles RN  Cardiac and Pulmonary Rehab Staff        Date 03/18/2019    @ Time 1020

## 2019-03-18 NOTE — Progress Notes (Signed)
Daily Session Note  Patient Details  Name: Brett Saunders MRN: [id number] Date of Birth: 02/24/1946 Referring Provider:     Pulmonary Rehab Walk Test from 02/22/2019 in [facility]  Referring Provider  Dr. Clance      Encounter Date: 03/18/2019  Check In: Session Check In - 03/18/19 0959      Check-In   Supervising physician immediately available to respond to emergencies  [facility] immediately available    Physician(s)  Dr. Hall    Location  [facility]    Staff Present   , RN, BSN;Dalton Fletcher, MS, Exercise Physiologist;Lisa Hughes, RN    Virtual Visit  No    Medication changes reported      No    Fall or balance concerns reported     No    Tobacco Cessation  No Change    Warm-up and Cool-down  Performed on first and last piece of equipment    Resistance Training Performed  Yes    VAD Patient?  No    PAD/SET Patient?  No      Pain Assessment   Currently in Pain?  No/denies    Multiple Pain Sites  No       Capillary Blood Glucose: No results found for this or any previous visit (from the past 24 hour(s)).    Social History   Tobacco Use  Smoking Status Light Tobacco Smoker  . Packs/day: 0.10  . Types: Cigarettes  Smokeless Tobacco Never Used  Tobacco Comment   pt smokes 1 cigarrete occasionally. 1 pack "last a few months"    Goals Met:  Proper associated with RPD/PD & O2 Sat Exercise tolerated well Strength training completed today  Goals Unmet:  Not Applicable  Comments: Service time is from 1000 to 1050    Dr. Wesam G. Yacoub is Medical Director for Pulmonary Rehab at [facility]. 

## 2019-03-22 ENCOUNTER — Inpatient Hospital Stay (HOSPITAL_COMMUNITY)
Admission: RE | Admit: 2019-03-22 | Discharge: 2019-03-22 | Disposition: A | Discharge status: Home / Self Care | Payer: Non-veteran care | Source: Ambulatory Visit

## 2019-03-22 ENCOUNTER — Other Ambulatory Visit: Payer: Self-pay

## 2019-03-22 DIAGNOSIS — J449 Chronic obstructive pulmonary disease, unspecified: Secondary | ICD-10-CM | POA: Insufficient documentation

## 2019-03-22 NOTE — Telephone Encounter (Signed)
Pt mentioned to cardiac rehab RD that he was was having trouble downloading app. Pt states he has misplaced the invitation via text message. Resent invitation to pt. Phoned pt to see if text message was received. Pt confirmed, denied additional help setting up app.   Carma Lair MS, ACSM CEP 12:10 PM 03/22/2019

## 2019-03-22 NOTE — Progress Notes (Signed)
Nutrition Note: Virtual Visit  Week 1 Spoke with pt for virtual pulmonary rehab. Pt has not started exercising at home - he has been getting his new bike put together and will begin exercising soon.   Diet recall reviewed. Goal: weight gain and healthier food choices Pt denies any problems with medications.  Nutrition goals reviewed. Activity: purchase nuts at store and add to snack with apple. Pt has already started eating peanut butter toast instead of a cake for breakfast. Pt reports gaining a few lbs. His weight at home this morning was 120 lbs. Handouts: snacks Reinforced education and goals with summary email.  Will continue to monitor pt with weekly phone calls for virtual cardiac rehab.   Michaele Offer, MS, RDN, LDN

## 2019-03-23 ENCOUNTER — Encounter (HOSPITAL_COMMUNITY): Payer: No Typology Code available for payment source

## 2019-03-25 ENCOUNTER — Encounter (HOSPITAL_COMMUNITY): Payer: No Typology Code available for payment source

## 2019-03-30 ENCOUNTER — Encounter (HOSPITAL_COMMUNITY): Payer: No Typology Code available for payment source

## 2019-03-30 ENCOUNTER — Encounter (HOSPITAL_COMMUNITY)
Admission: RE | Admit: 2019-03-30 | Discharge: 2019-03-30 | Disposition: A | Discharge status: Home / Self Care | Payer: No Typology Code available for payment source | Source: Ambulatory Visit | Attending: Pulmonary Disease | Admitting: Pulmonary Disease

## 2019-03-30 ENCOUNTER — Other Ambulatory Visit: Payer: Self-pay

## 2019-03-30 DIAGNOSIS — J449 Chronic obstructive pulmonary disease, unspecified: Secondary | ICD-10-CM

## 2019-03-30 NOTE — Progress Notes (Signed)
Called patient via phone regarding a symptom of shortness of breath he logged in the virtual pulmonary rehab app on 03/29/2019.  Brett Saunders has COPD and experiences shortness of breath with any activity normally.  When questioned about the symptom he states that it was his normal shortness of breath, not more than what he normally experiences.  I discussed with patient to log abnormal symptoms only.  He is still having problems logging his exercise, we discussed that it is logged in the post exercise tab.  I offered for him to come in person to pulmonary rehab to assist in using the app.  At this point he said he will figure it out.  Plan: Continue to monitor progression exercise in virtual pulmonary rehab.  Follow up 04/06/2019 @ 10am.

## 2019-03-30 NOTE — Progress Notes (Signed)
Nutrition Note: Virtual Visit  Week 2 Spoke with pt for virtual pulmonary rehab. Pt is exercising at home most days/week for 5-10 minutes/session (2 sessions/day). Pt is reporting exercise in virtual app.  He reports more energy and feeling better with exercise and diet changes.  Diet recall reviewed. Pt denies any problems with medications.  Nutrition goals reviewed. Activity: weekly weight to measure weight gain, 2 ensures per day, fruits and nuts for snacks.  Reinforced education and goals with summary email.  Will continue to monitor pt with weekly phone calls for virtual cardiac rehab.   Michaele Offer, MS, RDN, LDN

## 2019-04-01 ENCOUNTER — Encounter (HOSPITAL_COMMUNITY): Payer: No Typology Code available for payment source

## 2019-04-05 ENCOUNTER — Telehealth (HOSPITAL_COMMUNITY): Payer: Self-pay | Admitting: *Deleted

## 2019-04-05 ENCOUNTER — Encounter (HOSPITAL_COMMUNITY)
Admission: RE | Admit: 2019-04-05 | Discharge: 2019-04-05 | Disposition: A | Discharge status: Home / Self Care | Payer: No Typology Code available for payment source | Source: Ambulatory Visit | Attending: Pulmonary Disease | Admitting: Pulmonary Disease

## 2019-04-05 ENCOUNTER — Other Ambulatory Visit: Payer: Self-pay

## 2019-04-05 DIAGNOSIS — J449 Chronic obstructive pulmonary disease, unspecified: Secondary | ICD-10-CM

## 2019-04-05 NOTE — Telephone Encounter (Signed)
Called to see why patient has not logged any exercise in the virtual pulmonary rehab [employer] app.  Patient states that he has been busy and will try to exercise 2 times this week. Discussed that he and his wife are eligible for the COVID vaccine.  Patient states that he is on a waiting list through [employer], provided with information for covid vaccinations through [facility] which are being given at the [facility].  Patient is not sure if he wants to get the vaccine at this time.

## 2019-04-06 ENCOUNTER — Encounter (HOSPITAL_COMMUNITY): Payer: Non-veteran care

## 2019-04-06 ENCOUNTER — Encounter (HOSPITAL_COMMUNITY): Payer: No Typology Code available for payment source

## 2019-04-08 ENCOUNTER — Encounter (HOSPITAL_COMMUNITY): Payer: No Typology Code available for payment source

## 2019-04-12 ENCOUNTER — Telehealth (HOSPITAL_COMMUNITY): Payer: Self-pay | Admitting: *Deleted

## 2019-04-12 ENCOUNTER — Encounter (HOSPITAL_COMMUNITY)
Admission: RE | Admit: 2019-04-12 | Discharge: 2019-04-12 | Disposition: A | Discharge status: Home / Self Care | Payer: No Typology Code available for payment source | Source: Ambulatory Visit | Attending: Pulmonary Disease | Admitting: Pulmonary Disease

## 2019-04-12 NOTE — Telephone Encounter (Signed)
Called patient re: logged symptom of leg pain entered in the [employer] app for virtual pulmonary rehab.  Patient has peripheral vascular disease and experiences calf pain when walking, but describes this pain as different, a tingling as if his leg has "gone to sleep".  This has been occurring x 2 months and he has an appointment at [facility] to be evaluated for this issue.  Instructed patient to ask if there are any activities he should or should not do.   PLAN: Continue to monitor and follow progression while in virtual pulmonary rehab.

## 2019-04-13 ENCOUNTER — Other Ambulatory Visit (HOSPITAL_COMMUNITY): Payer: Self-pay | Admitting: Orthopedic Surgery

## 2019-04-13 ENCOUNTER — Encounter (HOSPITAL_COMMUNITY): Payer: No Typology Code available for payment source

## 2019-04-13 ENCOUNTER — Other Ambulatory Visit: Payer: Self-pay | Admitting: Orthopedic Surgery

## 2019-04-13 ENCOUNTER — Other Ambulatory Visit: Payer: Self-pay

## 2019-04-13 DIAGNOSIS — M546 Pain in thoracic spine: Secondary | ICD-10-CM

## 2019-04-15 ENCOUNTER — Encounter (HOSPITAL_COMMUNITY): Payer: No Typology Code available for payment source

## 2019-04-20 ENCOUNTER — Ambulatory Visit (HOSPITAL_COMMUNITY)
Admission: RE | Admit: 2019-04-20 | Discharge: 2019-04-20 | Disposition: A | Discharge status: Home / Self Care | Payer: No Typology Code available for payment source | Source: Ambulatory Visit | Attending: Orthopedic Surgery | Admitting: Orthopedic Surgery

## 2019-04-20 ENCOUNTER — Encounter (HOSPITAL_COMMUNITY): Payer: No Typology Code available for payment source

## 2019-04-20 ENCOUNTER — Other Ambulatory Visit: Payer: Self-pay

## 2019-04-20 DIAGNOSIS — M546 Pain in thoracic spine: Secondary | ICD-10-CM | POA: Insufficient documentation

## 2019-04-22 ENCOUNTER — Telehealth (HOSPITAL_COMMUNITY): Payer: Self-pay | Admitting: *Deleted

## 2019-04-22 ENCOUNTER — Other Ambulatory Visit: Payer: Self-pay

## 2019-04-22 ENCOUNTER — Encounter (HOSPITAL_COMMUNITY): Payer: No Typology Code available for payment source

## 2019-04-22 ENCOUNTER — Encounter (HOSPITAL_COMMUNITY)
Admission: RE | Admit: 2019-04-22 | Discharge: 2019-04-22 | Disposition: A | Discharge status: Home / Self Care | Payer: No Typology Code available for payment source | Source: Ambulatory Visit | Attending: Pulmonary Disease | Admitting: Pulmonary Disease

## 2019-04-22 DIAGNOSIS — J449 Chronic obstructive pulmonary disease, unspecified: Secondary | ICD-10-CM | POA: Insufficient documentation

## 2019-04-22 NOTE — Telephone Encounter (Signed)
Follow up call completed with patient re: his home exercise in virtual pulmonary rehab.  I am very proud of [name], he has been exercising at home on a stationary bike and walking for 20 minutes at a time.  His shortness of breath has not improved though.  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  Next is a bone density test in the future, but has not been scheduled.  He is to ask his orthopedic physician if he can continue exercising.  We also discussed exercising in person in pulmonary rehab in the near future, I will inform him of the date we will reopen.

## 2019-04-26 ENCOUNTER — Telehealth (HOSPITAL_COMMUNITY): Payer: Self-pay | Admitting: *Deleted

## 2019-04-26 NOTE — Telephone Encounter (Signed)
Called to check on patient who is participating in virtual pulmonary rehab, he would like to return to in person exercise which we are able to begin on May 11, 2019 and he would like to stay in the same class, which is 10:15 am.

## 2019-04-26 NOTE — Telephone Encounter (Signed)
Returned call, Brett Saunders does want to continue in person exercise in pulmonary rehab beginning 05/11/2019 in the 1015 class.

## 2019-04-27 ENCOUNTER — Encounter (HOSPITAL_COMMUNITY): Payer: No Typology Code available for payment source

## 2019-04-29 ENCOUNTER — Encounter (HOSPITAL_COMMUNITY): Payer: No Typology Code available for payment source

## 2019-05-04 NOTE — Progress Notes (Addendum)
Pulmonary Individual Treatment Plan  Patient Details  Name: Brett Saunders MRN: [id number] Date of Birth: 01/16/1946 Referring Provider:     Pulmonary Rehab Walk Test from 02/22/2019 in [place]  Referring Provider  Dr. Gwenette Greet      Initial Encounter Date:    Pulmonary Rehab Walk Test from 02/22/2019 in [place]  Date  02/22/19      Visit Diagnosis: Chronic obstructive pulmonary disease, unspecified COPD type ([place])  Patient's Home Medications on Admission:   Current Outpatient Medications:  .  acetaminophen (TYLENOL) 500 MG tablet, Take 500 mg by mouth 2 (two) times daily., Disp: , Rfl:  .  albuterol (PROVENTIL) (2.5 MG/3ML) 0.083% nebulizer solution, Take 2.5 mg by nebulization every 6 (six) hours as needed for wheezing or shortness of breath., Disp: , Rfl:  .  amiodarone (PACERONE) 200 MG tablet, Take 2 tablets (400 mg total) by mouth 2 (two) times daily. 2 tab twice daily for 2 weeks, 1 tablet twice daily for two weeks then one tablet daily, Disp: 90 tablet, Rfl: 1 .  apixaban (ELIQUIS) 5 MG TABS tablet, Take 2.5 mg by mouth 2 (two) times daily., Disp: , Rfl:  .  carvedilol (COREG) 3.125 MG tablet, Take 3.125 mg by mouth 2 (two) times daily with a meal., Disp: , Rfl:  .  diphenhydrAMINE (BENADRYL) 25 MG tablet, Take 25 mg by mouth at bedtime., Disp: , Rfl:  .  furosemide (LASIX) 20 MG tablet, Take 20 mg by mouth daily. , Disp: , Rfl:  .  Glycopyrrolate-Formoterol (BEVESPI AEROSPHERE) 9-4.8 MCG/ACT AERO, Inhale 2 puffs into the lungs 2 (two) times daily., Disp: , Rfl:  .  metoprolol succinate (TOPROL-XL) 25 MG 24 hr tablet, Take 25 mg by mouth daily., Disp: , Rfl:  .  OLODATEROL HCL IN, Inhale 2.5 mg into the lungs every morning. 2 puffs, Disp: , Rfl:  .  pentoxifylline (TRENTAL) 400 MG CR tablet, Take 400 mg by mouth 2 (two) times daily., Disp: , Rfl:  .  potassium chloride (KLOR-CON) 10 MEQ tablet, TAKE 1 TABLET BY  MOUTH EVERY DAY, Disp: 30 tablet, Rfl: 1 .  rOPINIRole (REQUIP) 0.25 MG tablet, Take 0.25 mg by mouth at bedtime. , Disp: , Rfl:  .  sodium chloride 1 g tablet, Take 1 g by mouth 3 (three) times daily., Disp: , Rfl:   Past Medical History: Past Medical History:  Diagnosis Date  . Aortic anomaly    aortic calcification  . Arrhythmia    a fib  . Cancer ([place])    lung  . Chronic obstructive lung disease ([place])   . Hypertension   . Ischemic cardiomyopathy   . Peripheral vascular disease ([place])   . Stroke [facility])    oct 2012    Tobacco Use: Social History   Tobacco Use  Smoking Status Light Tobacco Smoker  . Packs/day: 0.10  . Types: Cigarettes  Smokeless Tobacco Never Used  Tobacco Comment   pt smokes 1 cigarrete occasionally. 1 pack "last a few months"    Labs: Recent Review Flowsheet Data    There is no flowsheet data to display.      Capillary Blood Glucose: No results found for: GLUCAP   Pulmonary Assessment Scores: Pulmonary Assessment Scores    Row Name 02/22/19 1000 02/22/19 1030       ADL UCSD   ADL Phase  Entry  Entry    SOB Score total  45  -  CAT Score   CAT Score  22  -      mMRC Score   mMRC Score  -  2      UCSD: Self-administered rating of dyspnea associated with activities of daily living (ADLs) 6-point scale (0 = "not at all" to 5 = "maximal or unable to do because of breathlessness")  Scoring Scores range from 0 to 120.  Minimally important difference is 5 units  CAT: CAT can identify the health impairment of COPD patients and is better correlated with disease progression.  CAT has a scoring range of zero to 40. The CAT score is classified into four groups of low (less than 10), medium (10 - 20), high (21-30) and very high (31-40) based on the impact level of disease on health status. A CAT score over 10 suggests significant symptoms.  A worsening CAT score could be explained by an exacerbation, poor medication adherence, poor inhaler  technique, or progression of COPD or comorbid conditions.  CAT MCID is 2 points  mMRC: mMRC (Modified Medical Research Council) Dyspnea Scale is used to assess the degree of baseline functional disability in patients of respiratory disease due to dyspnea. No minimal important difference is established. A decrease in score of 1 point or greater is considered a positive change.   Pulmonary Function Assessment: Pulmonary Function Assessment - 02/22/19 0950      Breath   Bilateral Breath Sounds  Clear;Decreased    Shortness of Breath  Yes;Fear of Shortness of Breath;Limiting activity;Panic with Shortness of Breath       Exercise Target Goals: Exercise Program Goal: Individual exercise prescription set using results from initial 6 min walk test and THRR while considering  patient's activity barriers and safety.   Exercise Prescription Goal: Initial exercise prescription builds to 30-45 minutes a day of aerobic activity, 2-3 days per week.  Home exercise guidelines will be given to patient during program as part of exercise prescription that the participant will acknowledge.  Activity Barriers & Risk Stratification: Activity Barriers & Cardiac Risk Stratification - 02/22/19 0945      Activity Barriers & Cardiac Risk Stratification   Activity Barriers  Shortness of Breath;Muscular Weakness;Deconditioning;Back Problems       6 Minute Walk: 6 Minute Walk    Row Name 02/22/19 1030         6 Minute Walk   Phase  Initial     Distance  1055 feet     Walk Time  6 minutes     # of Rest Breaks  0     MPH  2     METS  3.02     RPE  12     Perceived Dyspnea   3     VO2 Peak  10.56     Symptoms  Yes (comment)     Comments  claudication in both calves     Resting HR  75 bpm     Resting BP  112/58     Resting Oxygen Saturation   98 %     Exercise Oxygen Saturation  during 6 min walk  94 %     Max Ex. HR  96 bpm     Max Ex. BP  140/68     2 Minute Post BP  108/64       Interval HR    1 Minute HR  96     2 Minute HR  96     3 Minute HR  95  4 Minute HR  94     5 Minute HR  95     6 Minute HR  95     2 Minute Post HR  85     Interval Heart Rate?  Yes       Interval Oxygen   Interval Oxygen?  Yes     Baseline Oxygen Saturation %  98 %     1 Minute Oxygen Saturation %  95 %     1 Minute Liters of Oxygen  0 L     2 Minute Oxygen Saturation %  94 %     2 Minute Liters of Oxygen  0 L     3 Minute Oxygen Saturation %  96 %     3 Minute Liters of Oxygen  0 L     4 Minute Oxygen Saturation %  94 %     4 Minute Liters of Oxygen  0 L     5 Minute Oxygen Saturation %  94 %     5 Minute Liters of Oxygen  0 L     6 Minute Oxygen Saturation %  95 %     6 Minute Liters of Oxygen  0 L     2 Minute Post Oxygen Saturation %  97 %     2 Minute Post Liters of Oxygen  0 L        Oxygen Initial Assessment: Oxygen Initial Assessment - 02/22/19 1029      Home Oxygen   Home Oxygen Device  Portable Concentrator;Home Concentrator;E-Tanks    Sleep Oxygen Prescription  Continuous    Home Exercise Oxygen Prescription  Pulsed    Liters per minute  2    Home at Rest Exercise Oxygen Prescription  None    Compliance with Home Oxygen Use  Yes      Initial 6 min Walk   Oxygen Used  None      Program Oxygen Prescription   Program Oxygen Prescription  None      Intervention   Short Term Goals  To learn and exhibit compliance with exercise, home and travel O2 prescription;To learn and understand importance of maintaining oxygen saturations>88%;To learn and demonstrate proper use of respiratory medications;To learn and understand importance of monitoring SPO2 with pulse oximeter and demonstrate accurate use of the pulse oximeter.;To learn and demonstrate proper pursed lip breathing techniques or other breathing techniques.    Long  Term Goals  Exhibits compliance with exercise, home and travel O2 prescription;Verbalizes importance of monitoring SPO2 with pulse oximeter and return  demonstration;Maintenance of O2 saturations>88%;Exhibits proper breathing techniques, such as pursed lip breathing or other method taught during program session;Compliance with respiratory medication;Demonstrates proper use of MDI's       Oxygen Re-Evaluation: Oxygen Re-Evaluation    Row Name 03/02/19 1147 05/04/19 0854           Program Oxygen Prescription   Program Oxygen Prescription  None  None        Home Oxygen   Home Oxygen Device  Portable Concentrator;Home Concentrator;E-Tanks  Portable Concentrator;Home Concentrator;E-Tanks      Sleep Oxygen Prescription  Continuous  Continuous      Liters per minute  2  2      Home Exercise Oxygen Prescription  Pulsed  Pulsed      Liters per minute  2  2      Home at Rest Exercise Oxygen Prescription  None  None  Compliance with Home Oxygen Use  Yes  Yes        Goals/Expected Outcomes   Short Term Goals  To learn and exhibit compliance with exercise, home and travel O2 prescription;To learn and understand importance of maintaining oxygen saturations>88%;To learn and demonstrate proper use of respiratory medications;To learn and understand importance of monitoring SPO2 with pulse oximeter and demonstrate accurate use of the pulse oximeter.;To learn and demonstrate proper pursed lip breathing techniques or other breathing techniques.  To learn and exhibit compliance with exercise, home and travel O2 prescription;To learn and understand importance of maintaining oxygen saturations>88%;To learn and demonstrate proper use of respiratory medications;To learn and understand importance of monitoring SPO2 with pulse oximeter and demonstrate accurate use of the pulse oximeter.;To learn and demonstrate proper pursed lip breathing techniques or other breathing techniques.      Long  Term Goals  Exhibits compliance with exercise, home and travel O2 prescription;Verbalizes importance of monitoring SPO2 with pulse oximeter and return  demonstration;Maintenance of O2 saturations>88%;Exhibits proper breathing techniques, such as pursed lip breathing or other method taught during program session;Compliance with respiratory medication;Demonstrates proper use of MDI's  Exhibits compliance with exercise, home and travel O2 prescription;Verbalizes importance of monitoring SPO2 with pulse oximeter and return demonstration;Maintenance of O2 saturations>88%;Exhibits proper breathing techniques, such as pursed lip breathing or other method taught during program session;Compliance with respiratory medication;Demonstrates proper use of MDI's      Goals/Expected Outcomes  compliance   compliance          Oxygen Discharge (Final Oxygen Re-Evaluation): Oxygen Re-Evaluation - 05/04/19 0854      Program Oxygen Prescription   Program Oxygen Prescription  None      Home Oxygen   Home Oxygen Device  Portable Concentrator;Home Concentrator;E-Tanks    Sleep Oxygen Prescription  Continuous    Liters per minute  2    Home Exercise Oxygen Prescription  Pulsed    Liters per minute  2    Home at Rest Exercise Oxygen Prescription  None    Compliance with Home Oxygen Use  Yes      Goals/Expected Outcomes   Short Term Goals  To learn and exhibit compliance with exercise, home and travel O2 prescription;To learn and understand importance of maintaining oxygen saturations>88%;To learn and demonstrate proper use of respiratory medications;To learn and understand importance of monitoring SPO2 with pulse oximeter and demonstrate accurate use of the pulse oximeter.;To learn and demonstrate proper pursed lip breathing techniques or other breathing techniques.    Long  Term Goals  Exhibits compliance with exercise, home and travel O2 prescription;Verbalizes importance of monitoring SPO2 with pulse oximeter and return demonstration;Maintenance of O2 saturations>88%;Exhibits proper breathing techniques, such as pursed lip breathing or other method taught during  program session;Compliance with respiratory medication;Demonstrates proper use of MDI's    Goals/Expected Outcomes  compliance        Initial Exercise Prescription: Initial Exercise Prescription - 02/22/19 1000      Date of Initial Exercise RX and Referring Provider   Date  02/22/19    Referring Provider  Dr. Gwenette Greet      Treadmill   MPH  1.7    Grade  1    Minutes  15      Bike   Level  2    Minutes  15      Prescription Details   Frequency (times per week)  2    Duration  Progress to 30 minutes of continuous aerobic without signs/symptoms of  physical distress      Intensity   THRR 40-80% of Max Heartrate  58-116    Ratings of Perceived Exertion  11-13    Perceived Dyspnea  0-4      Progression   Progression  Continue to progress workloads to maintain intensity without signs/symptoms of physical distress.      Resistance Training   Training Prescription  Yes    Weight  orange bands    Reps  10-15       Perform Capillary Blood Glucose checks as needed.  Exercise Prescription Changes: Exercise Prescription Changes    Row Name 03/02/19 1100 03/09/19 1100 03/16/19 1100         Response to Exercise   Blood Pressure (Admit)  108/52  -  110/50     Blood Pressure (Exercise)  144/64  -  132/64     Blood Pressure (Exit)  102/64  -  104/68     Heart Rate (Admit)  85 bpm  -  68 bpm     Heart Rate (Exercise)  94 bpm  -  90 bpm     Heart Rate (Exit)  74 bpm  -  68 bpm     Oxygen Saturation (Admit)  98 %  -  100 %     Oxygen Saturation (Exercise)  97 %  -  98 %     Oxygen Saturation (Exit)  100 %  -  100 %     Rating of Perceived Exertion (Exercise)  13  -  12     Perceived Dyspnea (Exercise)  3  -  3     Duration  Continue with 30 min of aerobic exercise without signs/symptoms of physical distress.  -  Continue with 30 min of aerobic exercise without signs/symptoms of physical distress.     Intensity  THRR unchanged  -  THRR unchanged       Progression   Progression   -  -  Continue to progress workloads to maintain intensity without signs/symptoms of physical distress.       Resistance Training   Training Prescription  Yes  -  Yes     Weight  orange bands  -  orange bands     Reps  10-15  -  10-15     Time  10 Minutes  -  10 Minutes       Bike   Level  1  -  1.5     Minutes  15  -  15     METs  2.1  -  -       NuStep   Level  2  -  2     SPM  80  -  80     Minutes  15  -  15     METs  2.1  -  1.8       Home Exercise Plan   Plans to continue exercise at  [facility] (comment)  -     Frequency  -  Add 3 additional days to program exercise sessions.  -     Initial Home Exercises Provided  -  03/09/19  -        Exercise Comments: Exercise Comments    Row Name 03/09/19 1148           Exercise Comments  home exercise complete          Exercise Goals and Review: Exercise Goals  [place] Name 02/22/19 1048 03/02/19 1147 05/04/19 0855         Exercise Goals   Increase Physical Activity  Yes  Yes  Yes     Intervention  Provide advice, education, support and counseling about physical activity/exercise needs.;Develop an individualized exercise prescription for aerobic and resistive training based on initial evaluation findings, risk stratification, comorbidities and participant's personal goals.  Provide advice, education, support and counseling about physical activity/exercise needs.;Develop an individualized exercise prescription for aerobic and resistive training based on initial evaluation findings, risk stratification, comorbidities and participant's personal goals.  Provide advice, education, support and counseling about physical activity/exercise needs.;Develop an individualized exercise prescription for aerobic and resistive training based on initial evaluation findings, risk stratification, comorbidities and participant's personal goals.     Expected Outcomes  Short Term: Attend rehab on a regular basis to increase amount of  physical activity.;Long Term: Add in home exercise to make exercise part of routine and to increase amount of physical activity.;Long Term: Exercising regularly at least 3-5 days a week.  Short Term: Attend rehab on a regular basis to increase amount of physical activity.;Long Term: Add in home exercise to make exercise part of routine and to increase amount of physical activity.;Long Term: Exercising regularly at least 3-5 days a week.  Short Term: Attend rehab on a regular basis to increase amount of physical activity.;Long Term: Add in home exercise to make exercise part of routine and to increase amount of physical activity.;Long Term: Exercising regularly at least 3-5 days a week.     Increase Strength and Stamina  Yes  Yes  Yes     Intervention  Provide advice, education, support and counseling about physical activity/exercise needs.;Develop an individualized exercise prescription for aerobic and resistive training based on initial evaluation findings, risk stratification, comorbidities and participant's personal goals.  Provide advice, education, support and counseling about physical activity/exercise needs.;Develop an individualized exercise prescription for aerobic and resistive training based on initial evaluation findings, risk stratification, comorbidities and participant's personal goals.  Provide advice, education, support and counseling about physical activity/exercise needs.;Develop an individualized exercise prescription for aerobic and resistive training based on initial evaluation findings, risk stratification, comorbidities and participant's personal goals.     Expected Outcomes  Short Term: Increase workloads from initial exercise prescription for resistance, speed, and METs.;Short Term: Perform resistance training exercises routinely during rehab and add in resistance training at home;Long Term: Improve cardiorespiratory fitness, muscular endurance and strength as measured by increased METs  and functional capacity (6MWT)  Short Term: Increase workloads from initial exercise prescription for resistance, speed, and METs.;Short Term: Perform resistance training exercises routinely during rehab and add in resistance training at home;Long Term: Improve cardiorespiratory fitness, muscular endurance and strength as measured by increased METs and functional capacity (6MWT)  Short Term: Increase workloads from initial exercise prescription for resistance, speed, and METs.;Short Term: Perform resistance training exercises routinely during rehab and add in resistance training at home;Long Term: Improve cardiorespiratory fitness, muscular endurance and strength as measured by increased METs and functional capacity (6MWT)     Able to understand and use rate of perceived exertion (RPE) scale  Yes  Yes  Yes     Intervention  Provide education and explanation on how to use RPE scale  Provide education and explanation on how to use RPE scale  Provide education and explanation on how to use RPE scale     Expected Outcomes  Short Term: Able to use RPE daily in rehab to express  subjective intensity level;Long Term:  Able to use RPE to guide intensity level when exercising independently  Short Term: Able to use RPE daily in rehab to express subjective intensity level;Long Term:  Able to use RPE to guide intensity level when exercising independently  Short Term: Able to use RPE daily in rehab to express subjective intensity level;Long Term:  Able to use RPE to guide intensity level when exercising independently     Able to understand and use Dyspnea scale  Yes  Yes  Yes     Intervention  Provide education and explanation on how to use Dyspnea scale  Provide education and explanation on how to use Dyspnea scale  Provide education and explanation on how to use Dyspnea scale     Expected Outcomes  Short Term: Able to use Dyspnea scale daily in rehab to express subjective sense of shortness of breath during exertion;Long  Term: Able to use Dyspnea scale to guide intensity level when exercising independently  Short Term: Able to use Dyspnea scale daily in rehab to express subjective sense of shortness of breath during exertion;Long Term: Able to use Dyspnea scale to guide intensity level when exercising independently  Short Term: Able to use Dyspnea scale daily in rehab to express subjective sense of shortness of breath during exertion;Long Term: Able to use Dyspnea scale to guide intensity level when exercising independently     Knowledge and understanding of Target Heart Rate Range (THRR)  Yes  Yes  Yes     Intervention  Provide education and explanation of THRR including how the numbers were predicted and where they are located for reference  Provide education and explanation of THRR including how the numbers were predicted and where they are located for reference  Provide education and explanation of THRR including how the numbers were predicted and where they are located for reference     Expected Outcomes  Short Term: Able to state/look up THRR;Long Term: Able to use THRR to govern intensity when exercising independently;Short Term: Able to use daily as guideline for intensity in rehab  Short Term: Able to state/look up THRR;Long Term: Able to use THRR to govern intensity when exercising independently;Short Term: Able to use daily as guideline for intensity in rehab  Short Term: Able to state/look up THRR;Long Term: Able to use THRR to govern intensity when exercising independently;Short Term: Able to use daily as guideline for intensity in rehab     Understanding of Exercise Prescription  Yes  Yes  Yes     Intervention  Provide education, explanation, and written materials on patient's individual exercise prescription  Provide education, explanation, and written materials on patient's individual exercise prescription  Provide education, explanation, and written materials on patient's individual exercise prescription      Expected Outcomes  Short Term: Able to explain program exercise prescription;Long Term: Able to explain home exercise prescription to exercise independently  Short Term: Able to explain program exercise prescription;Long Term: Able to explain home exercise prescription to exercise independently  Short Term: Able to explain program exercise prescription;Long Term: Able to explain home exercise prescription to exercise independently     Improve claudication pain toleration; Improve walking ability  Yes  Yes  Yes     Intervention  Participate in PAD/SET Rehab 2-3 days a week, walking at home as part of exercise prescription;Attend education sessions to aid in risk factor modification and understanding of disease process  Participate in PAD/SET Rehab 2-3 days a week, walking at home as  part of exercise prescription;Attend education sessions to aid in risk factor modification and understanding of disease process  Participate in PAD/SET Rehab 2-3 days a week, walking at home as part of exercise prescription;Attend education sessions to aid in risk factor modification and understanding of disease process     Expected Outcomes  Short Term: Improve walking distance/time to onset of claudication pain;Long Term: Improve walking ability and toleration to claudication;Long Term: Improve score of PAD questionnaires  Short Term: Improve walking distance/time to onset of claudication pain;Long Term: Improve walking ability and toleration to claudication;Long Term: Improve score of PAD questionnaires  Short Term: Improve walking distance/time to onset of claudication pain;Long Term: Improve walking ability and toleration to claudication;Long Term: Improve score of PAD questionnaires        Exercise Goals Re-Evaluation : Exercise Goals Re-Evaluation    Row Name 03/02/19 1148 05/04/19 0855           Exercise Goal Re-Evaluation   Exercise Goals Review  Increase Physical Activity;Able to understand and use rate of  perceived exertion (RPE) scale;Increase Strength and Stamina;Able to understand and use Dyspnea scale;Knowledge and understanding of Target Heart Rate Range (THRR);Understanding of Exercise Prescription  Increase Physical Activity;Able to understand and use rate of perceived exertion (RPE) scale;Increase Strength and Stamina;Able to understand and use Dyspnea scale;Knowledge and understanding of Target Heart Rate Range (THRR);Understanding of Exercise Prescription      Comments  Pt completed first exercise session. Pt was unable to walk on the TM due to fatigue. Pt exercised at 2.1 METs on the stepper. Will continue to monitor and progress as able.  Pt has been riding a stationary bike at home since our cessation of in person exercise. Pt will return to exercise 2/23. Will continue to monitor and progress.      Expected Outcomes  Through exercise at rehab and at home, the patient will decrease shortness of breath with daily activities and feel confident in carrying out an exercise regime at home.   Through exercise at rehab and at home, the patient will decrease shortness of breath with daily activities and feel confident in carrying out an exercise regime at home.          Discharge Exercise Prescription (Final Exercise Prescription Changes): Exercise Prescription Changes - 03/16/19 1100      Response to Exercise   Blood Pressure (Admit)  110/50    Blood Pressure (Exercise)  132/64    Blood Pressure (Exit)  104/68    Heart Rate (Admit)  68 bpm    Heart Rate (Exercise)  90 bpm    Heart Rate (Exit)  68 bpm    Oxygen Saturation (Admit)  100 %    Oxygen Saturation (Exercise)  98 %    Oxygen Saturation (Exit)  100 %    Rating of Perceived Exertion (Exercise)  12    Perceived Dyspnea (Exercise)  3    Duration  Continue with 30 min of aerobic exercise without signs/symptoms of physical distress.    Intensity  THRR unchanged      Progression   Progression  Continue to progress workloads to maintain  intensity without signs/symptoms of physical distress.      Resistance Training   Training Prescription  Yes    Weight  orange bands    Reps  10-15    Time  10 Minutes      Bike   Level  1.5    Minutes  15      NuStep  Level  2    SPM  80    Minutes  15    METs  1.8       Nutrition:  Target Goals: Understanding of nutrition guidelines, daily intake of sodium 1500mg , cholesterol 200mg , calories 30% from fat and 7% or less from saturated fats, daily to have 5 or more servings of fruits and vegetables.  Biometrics: Pre Biometrics - 02/22/19 0948      Pre Biometrics   Height  5\' 11"  (1.803 m)    Weight  53.1 kg    BMI (Calculated)  16.33    Grip Strength  24 kg        Nutrition Therapy Plan and Nutrition Goals: Nutrition Therapy & Goals - 03/04/19 1332      Nutrition Therapy   Diet  high protein, high calorie      Personal Nutrition Goals   Nutrition Goal  Pt to drink 3 ensures/day and add fats to meals and snacks    Personal Goal #2  Identify food quantities necessary to achieve wt gain of  -2# per week to a goal wt gain of 5-24 lb at graduation from pulmonary rehab.      Intervention Plan   Intervention  Prescribe, educate and counsel regarding individualized specific dietary modifications aiming towards targeted core components such as weight, hypertension, lipid management, diabetes, heart failure and other comorbidities.    Expected Outcomes  Long Term Goal: Adherence to prescribed nutrition plan.;Short Term Goal: A plan has been developed with personal nutrition goals set during dietitian appointment.       Nutrition Assessments: Nutrition Assessments - 03/04/19 1335      Rate Your Plate Scores   Pre Score  30       Nutrition Goals Re-Evaluation: Nutrition Goals Re-Evaluation    Row Name 03/04/19 1334             Goals   Current Weight  118 lb (53.5 kg)       Nutrition Goal  Pt to drink 3 ensures/day and add fats to meals and snacks          Personal Goal #2 Re-Evaluation   Personal Goal #2  Identify food quantities necessary to achieve wt gain of  -2# per week to a goal wt gain of 5-24 lb at graduation from pulmonary rehab.          Nutrition Goals Discharge (Final Nutrition Goals Re-Evaluation): Nutrition Goals Re-Evaluation - 03/04/19 1334      Goals   Current Weight  118 lb (53.5 kg)    Nutrition Goal  Pt to drink 3 ensures/day and add fats to meals and snacks      Personal Goal #2 Re-Evaluation   Personal Goal #2  Identify food quantities necessary to achieve wt gain of  -2# per week to a goal wt gain of 5-24 lb at graduation from pulmonary rehab.       Psychosocial: Target Goals: Acknowledge presence or absence of significant depression and/or stress, maximize coping skills, provide positive support system. Participant is able to verbalize types and ability to use techniques and skills needed for reducing stress and depression.  Initial Review & Psychosocial Screening: Initial Psych Review & Screening - 02/22/19 1001      Initial Review   Current issues with  None Identified      Family Dynamics   Good Support System?  Yes      Barriers   Psychosocial barriers to participate in program  There are no identifiable barriers or psychosocial needs.      Screening Interventions   Interventions  Encouraged to exercise       Quality of Life Scores:  Scores of 19 and below usually indicate a poorer quality of life in these areas.  A difference of  2-3 points is a clinically meaningful difference.  A difference of 2-3 points in the total score of the Quality of Life Index has been associated with significant improvement in overall quality of life, self-image, physical symptoms, and general health in studies assessing change in quality of life.  PHQ-9: Recent Review Flowsheet Data    Depression screen [facility] 2/9 02/22/2019 03/13/2018 03/13/2018   Decreased Interest 0 0 0   Down, Depressed, Hopeless 0 0 0   PHQ - 2  Score 0 0 0   Altered sleeping 1 0 0   Tired, decreased energy 0 3 3   Change in appetite 0 3 3   Feeling bad or failure about yourself  0 0 0   Trouble concentrating 0 0 0   Moving slowly or fidgety/restless 0 0 0   Suicidal thoughts 0 0 0   PHQ-9 Score - 6 6   Difficult doing work/chores Not difficult at all Somewhat difficult Somewhat difficult     Interpretation of Total Score  Total Score Depression Severity:  1-4 = Minimal depression, 5-9 = Mild depression, 10-14 = Moderate depression, 15-19 = Moderately severe depression, 20-27 = Severe depression   Psychosocial Evaluation and Intervention: Psychosocial Evaluation - 02/22/19 1001      Psychosocial Evaluation & Interventions   Interventions  Encouraged to exercise with the program and follow exercise prescription    Continue Psychosocial Services   No Follow up required       Psychosocial Re-Evaluation: Psychosocial Re-Evaluation    [place] Name 02/22/19 1001 03/02/19 1150 05/03/19 1338         Psychosocial Re-Evaluation   Current issues with  None Identified  None Identified  None Identified     Comments  -  -  I have called patient weekly since department closure, which has been 7 weeks.  He has been exercising at home with a gym system he has purchased.  His mental well being has been stable.     Expected Outcomes  -  -  Continued mental well-being.     Interventions  Encouraged to attend Pulmonary Rehabilitation for the exercise  Encouraged to attend Pulmonary Rehabilitation for the exercise  Encouraged to attend Pulmonary Rehabilitation for the exercise     Continue Psychosocial Services   No Follow up required  No Follow up required  No Follow up required        Psychosocial Discharge (Final Psychosocial Re-Evaluation): Psychosocial Re-Evaluation - 05/03/19 1338      Psychosocial Re-Evaluation   Current issues with  None Identified    Comments  I have called patient weekly since department closure, which has been  7 weeks.  He has been exercising at home with a gym system he has purchased.  His mental well being has been stable.    Expected Outcomes  Continued mental well-being.    Interventions  Encouraged to attend Pulmonary Rehabilitation for the exercise    Continue Psychosocial Services   No Follow up required       Education: Education Goals: Education classes will be provided on a weekly basis, covering required topics. Participant will state understanding/return demonstration of topics presented.  Learning Barriers/Preferences: Learning  Barriers/Preferences - 02/22/19 1002      Learning Barriers/Preferences   Learning Barriers  None    Learning Preferences  Audio;Computer/Internet;Group Instruction;Individual Instruction;Pictoral;Skilled Demonstration;Verbal Instruction;Video;Written Material       Education Topics: Risk Factor Reduction:  -Group instruction that is supported by a PowerPoint presentation. Instructor discusses the definition of a risk factor, different risk factors for pulmonary disease, and how the heart and lungs work together.     Nutrition for Pulmonary Patient:  -Group instruction provided by PowerPoint slides, verbal discussion, and written materials to support subject matter. The instructor gives an explanation and review of healthy diet recommendations, which includes a discussion on weight management, recommendations for fruit and vegetable consumption, as well as protein, fluid, caffeine, fiber, sodium, sugar, and alcohol. Tips for eating when patients are short of breath are discussed.   PULMONARY REHAB CHRONIC OBSTRUCTIVE PULMONARY DISEASE from 03/09/2019 in [place]  Date  03/09/19  Educator  -- [[place]]      Pursed Lip Breathing:  -Group instruction that is supported by demonstration and informational handouts. Instructor discusses the benefits of pursed lip and diaphragmatic breathing and detailed demonstration on how to  preform both.     Oxygen Safety:  -Group instruction provided by PowerPoint, verbal discussion, and written material to support subject matter. There is an overview of "What is Oxygen" and "Why do we need it".  Instructor also reviews how to create a safe environment for oxygen use, the importance of using oxygen as prescribed, and the risks of noncompliance. There is a brief discussion on traveling with oxygen and resources the patient may utilize.   PULMONARY REHAB CHRONIC OBSTRUCTIVE PULMONARY DISEASE from 05/28/2018 in [place]  Date  04/23/18  Educator  Cloyde Reams  Instruction Review Code  1- Verbalizes Understanding      Oxygen Equipment:  -Group instruction provided by [employer] utilizing handouts, written materials, and Insurance underwriter.   Signs and Symptoms:  -Group instruction provided by written material and verbal discussion to support subject matter. Warning signs and symptoms of infection, stroke, and heart attack are reviewed and when to call the physician/911 reinforced. Tips for preventing the spread of infection discussed.   PULMONARY REHAB CHRONIC OBSTRUCTIVE PULMONARY DISEASE from 05/28/2018 in [place]  Date  05/14/18  Educator  Remo Lipps  Instruction Review Code  1- Verbalizes Understanding      Advanced Directives:  -Group instruction provided by verbal instruction and written material to support subject matter. Instructor reviews Advanced Directive laws and proper instruction for filling out document.   Pulmonary Video:  -Group video education that reviews the importance of medication and oxygen compliance, exercise, good nutrition, pulmonary hygiene, and pursed lip and diaphragmatic breathing for the pulmonary patient.   Exercise for the Pulmonary Patient:  -Group instruction that is supported by a PowerPoint presentation. Instructor discusses benefits of exercise, core components of  exercise, frequency, duration, and intensity of an exercise routine, importance of utilizing pulse oximetry during exercise, safety while exercising, and options of places to exercise outside of rehab.     PULMONARY REHAB CHRONIC OBSTRUCTIVE PULMONARY DISEASE from 05/28/2018 in [place]  Date  05/07/18  Educator  Kirk Ruths  Instruction Review Code  1- Verbalizes Understanding      Pulmonary Medications:  -Verbally interactive group education provided by instructor with focus on inhaled medications and proper administration.   PULMONARY REHAB CHRONIC OBSTRUCTIVE PULMONARY DISEASE  from 03/09/2019 in [place]  Date  03/04/19  Educator  -- [Handout]      Anatomy and Physiology of the Respiratory System and Intimacy:  -Group instruction provided by PowerPoint, verbal discussion, and written material to support subject matter. Instructor reviews respiratory cycle and anatomical components of the respiratory system and their functions. Instructor also reviews differences in obstructive and restrictive respiratory diseases with examples of each. Intimacy, Sex, and Sexuality differences are reviewed with a discussion on how relationships can change when diagnosed with pulmonary disease. Common sexual concerns are reviewed.   PULMONARY REHAB CHRONIC OBSTRUCTIVE PULMONARY DISEASE from 05/28/2018 in [place]  Date  05/28/18  Educator  rn  Instruction Review Code  1- Verbalizes Understanding      MD DAY -A group question and answer session with a medical doctor that allows participants to ask questions that relate to their pulmonary disease state.   OTHER EDUCATION -Group or individual verbal, written, or video instructions that support the educational goals of the pulmonary rehab program.   PULMONARY REHAB CHRONIC OBSTRUCTIVE PULMONARY DISEASE from 05/28/2018 in [place]  Date  04/30/18  Educator  Iona Beard [[employer]]  Instruction Review Code  1- Verbalizes Understanding      Holiday Eating Survival Tips:  -Group instruction provided by PowerPoint slides, verbal discussion, and written materials to support subject matter. The instructor gives patients tips, tricks, and techniques to help them not only survive but enjoy the holidays despite the onslaught of food that accompanies the holidays.   Knowledge Questionnaire Score: Knowledge Questionnaire Score - 02/22/19 1002      Knowledge Questionnaire Score   Pre Score  16/18       Core Components/Risk Factors/Patient Goals at Admission: Personal Goals and Risk Factors at Admission - 02/22/19 1002      Core Components/Risk Factors/Patient Goals on Admission    Weight Management  Weight Gain    Improve shortness of breath with ADL's  Yes    Intervention  Provide education, individualized exercise plan and daily activity instruction to help decrease symptoms of SOB with activities of daily living.    Expected Outcomes  Short Term: Improve cardiorespiratory fitness to achieve a reduction of symptoms when performing ADLs;Long Term: Be able to perform more ADLs without symptoms or delay the onset of symptoms       Core Components/Risk Factors/Patient Goals Review:  Goals and Risk Factor Review    Row Name 02/22/19 1003 03/02/19 1151 05/03/19 1340         Core Components/Risk Factors/Patient Goals Review   Personal Goals Review  Improve shortness of breath with ADL's;Increase knowledge of respiratory medications and ability to use respiratory devices properly.;Develop more efficient breathing techniques such as purse lipped breathing and diaphragmatic breathing and practicing self-pacing with activity.;Weight Management/Obesity weight gain needed  Weight Management/Obesity;Develop more efficient breathing techniques such as purse lipped breathing and diaphragmatic breathing and practicing self-pacing  with activity.;Increase knowledge of respiratory medications and ability to use respiratory devices properly.;Improve shortness of breath with ADL's  Weight Management/Obesity;Develop more efficient breathing techniques such as purse lipped breathing and diaphragmatic breathing and practicing self-pacing with activity.;Increase knowledge of respiratory medications and ability to use respiratory devices properly.;Improve shortness of breath with ADL's     Review  -  Just started program today, is very deconditioned, unable to walk on the treadmill for a sustained 15 minutes, changed equipment to upright bike and nustep.  Also suggested he should eat breakfast before exercising.  Department closure for 7 weeks, patient will begin in person exercise in pulmonary rehab 05/11/2019.  Will begin working on shortness of breath and deconditioning.     Expected Outcomes  -  See admission goals.  See admission goals.        Core Components/Risk Factors/Patient Goals at Discharge (Final Review):  Goals and Risk Factor Review - 05/03/19 1340      Core Components/Risk Factors/Patient Goals Review   Personal Goals Review  Weight Management/Obesity;Develop more efficient breathing techniques such as purse lipped breathing and diaphragmatic breathing and practicing self-pacing with activity.;Increase knowledge of respiratory medications and ability to use respiratory devices properly.;Improve shortness of breath with ADL's    Review  Department closure for 7 weeks, patient will begin in person exercise in pulmonary rehab 05/11/2019.  Will begin working on shortness of breath and deconditioning.    Expected Outcomes  See admission goals.       ITP Comments:   Comments: ITP REVIEW Pt is making expected progress toward pulmonary rehab goals after completing 5 sessions. Recommend continued exercise, life style modification, education, and utilization of breathing techniques to increase stamina and strength and decrease  shortness of breath with exertion.

## 2019-05-11 ENCOUNTER — Encounter (HOSPITAL_COMMUNITY)
Admission: RE | Admit: 2019-05-11 | Discharge: 2019-05-11 | Disposition: A | Discharge status: Home / Self Care | Payer: No Typology Code available for payment source | Source: Ambulatory Visit | Attending: Pulmonary Disease | Admitting: Pulmonary Disease

## 2019-05-11 ENCOUNTER — Other Ambulatory Visit: Payer: Self-pay

## 2019-05-11 VITALS — Wt 123.2 lb

## 2019-05-11 DIAGNOSIS — J449 Chronic obstructive pulmonary disease, unspecified: Secondary | ICD-10-CM | POA: Diagnosis not present

## 2019-05-11 NOTE — Progress Notes (Signed)
Daily Session Note  Patient Details  Name: Brett Saunders MRN: [id number] Date of Birth: 11/05/1945 Referring Provider:     Pulmonary Rehab Walk Test from 02/22/2019 in [place]  Referring Provider  Dr. Gwenette Greet      Encounter Date: 05/11/2019  Check In: Session Check In - 05/11/19 1028      Check-In   Supervising physician immediately available to respond to emergencies  See telemetry face sheet for immediately available MD    Physician(s)  Dr. Cyndia Skeeters    Location  [facility]    Staff Present  Rosebud Poles, RN, Bjorn Loser, MS, Exercise Physiologist;Lisa Ysidro Evert, RN    Virtual Visit  No    Medication changes reported      No    Tobacco Cessation  No Change    Warm-up and Cool-down  Performed as group-led instruction    Resistance Training Performed  Yes    VAD Patient?  No    PAD/SET Patient?  No      Pain Assessment   Currently in Pain?  No/denies    Multiple Pain Sites  No       Capillary Blood Glucose: No results found for this or any previous visit (from the past 24 hour(s)).  Exercise Prescription Changes - 05/11/19 1100      Response to Exercise   Blood Pressure (Admit)  124/56    Blood Pressure (Exercise)  122/74    Blood Pressure (Exit)  116/60    Heart Rate (Admit)  79 bpm    Heart Rate (Exercise)  80 bpm    Heart Rate (Exit)  80 bpm    Oxygen Saturation (Admit)  100 %    Oxygen Saturation (Exercise)  98 %    Oxygen Saturation (Exit)  100 %    Rating of Perceived Exertion (Exercise)  12    Perceived Dyspnea (Exercise)  1    Duration  Continue with 30 min of aerobic exercise without signs/symptoms of physical distress.    Intensity  THRR unchanged      Progression   Progression  Continue to progress workloads to maintain intensity without signs/symptoms of physical distress.      Resistance Training   Training Prescription  Yes    Weight  orange bands    Reps  10-15    Time  10 Minutes      Bike   Level  1.5    Minutes  15    METs  2      NuStep   Level  2    SPM  80    Minutes  15    METs  1.8       Social History   Tobacco Use  Smoking Status Light Tobacco Smoker  . Packs/day: 0.10  . Types: Cigarettes  Smokeless Tobacco Never Used  Tobacco Comment   pt smokes 1 cigarrete occasionally. 1 pack "last a few months"    Goals Met:  Independence with exercise equipment Exercise tolerated well Strength training completed today  Goals Unmet:  Not Applicable  Comments: Service time is from 1005 to 1107    Dr. Fransico Him is Medical Director for Cardiac Rehab at [facility].

## 2019-05-11 NOTE — Progress Notes (Signed)
Nutrition Note Spoke with Brett Saunders during exercise. He said his legs feels stronger from exercising at home. He reports his weight ~120-122 lbs. He lost 25 lbs in 2020 (135 lbs to 110 lbs). He has made good progress with his weight gain and feeling more energy. He continues to work on increasing nutrient dense foods. Currently eating peanut butter, whole grain bread, peanuts, and apples.We discussed vegetables and how to incorporate. Will continue monitoring [name]'s nutrition intake during cardiac rehab. He verbalized understanding.   Michaele Offer, MS, RDN, LDN

## 2019-05-13 ENCOUNTER — Encounter (HOSPITAL_COMMUNITY): Payer: No Typology Code available for payment source

## 2019-05-13 ENCOUNTER — Telehealth (HOSPITAL_COMMUNITY): Payer: Self-pay | Admitting: Family Medicine

## 2019-05-18 ENCOUNTER — Other Ambulatory Visit: Payer: Self-pay

## 2019-05-18 ENCOUNTER — Encounter (HOSPITAL_COMMUNITY)
Admission: RE | Admit: 2019-05-18 | Discharge: 2019-05-18 | Disposition: A | Discharge status: Home / Self Care | Payer: No Typology Code available for payment source | Source: Ambulatory Visit | Attending: Pulmonary Disease | Admitting: Pulmonary Disease

## 2019-05-18 DIAGNOSIS — J449 Chronic obstructive pulmonary disease, unspecified: Secondary | ICD-10-CM | POA: Diagnosis present

## 2019-05-18 NOTE — Progress Notes (Signed)
Daily Session Note  Patient Details  Name: Brett Saunders MRN: [id number] Date of Birth: November 20, 1945 Referring Provider:     Pulmonary Rehab Walk Test from 02/22/2019 in [place]  Referring Provider  Dr. Gwenette Greet      Encounter Date: 05/18/2019  Check In: Session Check In - 05/18/19 1035      Check-In   Supervising physician immediately available to respond to emergencies  [facility] immediately available    Physician(s)  Dr. Doristine Bosworth    Location  [facility]    Staff Present  Rosebud Poles, RN, Bjorn Loser, MS, Exercise Physiologist;Brittany Durene Fruits, BS, ACSM CEP, Exercise Physiologist    Virtual Visit  No    Medication changes reported      No    Fall or balance concerns reported     No    Tobacco Cessation  No Change    Warm-up and Cool-down  Performed as group-led instruction    Resistance Training Performed  Yes    VAD Patient?  No    PAD/SET Patient?  No      Pain Assessment   Currently in Pain?  No/denies    Multiple Pain Sites  No       Capillary Blood Glucose: No results found for this or any previous visit (from the past 24 hour(s)).    Social History   Tobacco Use  Smoking Status Light Tobacco Smoker  . Packs/day: 0.10  . Types: Cigarettes  Smokeless Tobacco Never Used  Tobacco Comment   pt smokes 1 cigarrete occasionally. 1 pack "last a few months"    Goals Met:  Independence with exercise equipment Exercise tolerated well Strength training completed today  Goals Unmet:  Not Applicable  Comments: Service time is from 1005 to 1100    Dr. Fransico Him is Medical Director for Cardiac Rehab at [facility].

## 2019-05-20 ENCOUNTER — Other Ambulatory Visit: Payer: Self-pay

## 2019-05-20 ENCOUNTER — Encounter (HOSPITAL_COMMUNITY)
Admission: RE | Admit: 2019-05-20 | Discharge: 2019-05-20 | Disposition: A | Discharge status: Home / Self Care | Payer: No Typology Code available for payment source | Source: Ambulatory Visit | Attending: Pulmonary Disease | Admitting: Pulmonary Disease

## 2019-05-20 DIAGNOSIS — J449 Chronic obstructive pulmonary disease, unspecified: Secondary | ICD-10-CM | POA: Diagnosis not present

## 2019-05-20 NOTE — Progress Notes (Signed)
Daily Session Note  Patient Details  Name: Brett Saunders MRN: [id number] Date of Birth: 09/29/1945 Referring Provider:     Pulmonary Rehab Walk Test from 02/22/2019 in [place]  Referring Provider  Dr. Gwenette Greet      Encounter Date: 05/20/2019  Check In: Session Check In - 05/20/19 1133      Check-In   Supervising physician immediately available to respond to emergencies  [facility] immediately available    Physician(s)  Dr. Broadus John    Location  [facility]    Staff Present  Rosebud Poles, RN, Bjorn Loser, MS, Exercise Physiologist;Brittany Durene Fruits, BS, ACSM CEP, Exercise Physiologist    Virtual Visit  No    Medication changes reported      No    Fall or balance concerns reported     No    Tobacco Cessation  No Change    Warm-up and Cool-down  Performed on first and last piece of equipment    Resistance Training Performed  Yes    VAD Patient?  No    PAD/SET Patient?  No      Pain Assessment   Currently in Pain?  No/denies    Multiple Pain Sites  No       Capillary Blood Glucose: No results found for this or any previous visit (from the past 24 hour(s)).    Social History   Tobacco Use  Smoking Status Light Tobacco Smoker  . Packs/day: 0.10  . Types: Cigarettes  Smokeless Tobacco Never Used  Tobacco Comment   pt smokes 1 cigarrete occasionally. 1 pack "last a few months"    Goals Met:  Independence with exercise equipment Exercise tolerated well Strength training completed today  Goals Unmet:  Not Applicable  Comments: Service time is from 1008 to 1055    Dr. Rush Farmer is Medical Director for Pulmonary Rehab at [facility].

## 2019-05-25 ENCOUNTER — Encounter (HOSPITAL_COMMUNITY)
Admission: RE | Admit: 2019-05-25 | Discharge: 2019-05-25 | Disposition: A | Discharge status: Home / Self Care | Payer: No Typology Code available for payment source | Source: Ambulatory Visit | Attending: Pulmonary Disease | Admitting: Pulmonary Disease

## 2019-05-25 ENCOUNTER — Other Ambulatory Visit: Payer: Self-pay

## 2019-05-25 VITALS — Wt 121.7 lb

## 2019-05-25 DIAGNOSIS — J449 Chronic obstructive pulmonary disease, unspecified: Secondary | ICD-10-CM

## 2019-05-25 NOTE — Progress Notes (Signed)
Daily Session Note  Patient Details  Name: Brett Saunders MRN: [id number] Date of Birth: 1946-03-01 Referring Provider:     Pulmonary Rehab Walk Test from 02/22/2019 in [place]  Referring Provider  Dr. Gwenette Greet      Encounter Date: 05/25/2019  Check In: Session Check In - 05/25/19 1023      Check-In   Supervising physician immediately available to respond to emergencies  [facility] immediately available    Physician(s)  Dr. Broadus John    Location  [facility]    Staff Present  Rosebud Poles, RN, Bjorn Loser, MS, Exercise Physiologist;Kasumi Ditullio Ysidro Evert, RN    Virtual Visit  No    Medication changes reported      No    Fall or balance concerns reported     No    Tobacco Cessation  No Change    Warm-up and Cool-down  Performed as group-led instruction    Resistance Training Performed  Yes    VAD Patient?  No    PAD/SET Patient?  No      Pain Assessment   Currently in Pain?  No/denies    Multiple Pain Sites  No       Capillary Blood Glucose: No results found for this or any previous visit (from the past 24 hour(s)).  Exercise Prescription Changes - 05/25/19 1100      Response to Exercise   Blood Pressure (Admit)  114/64    Blood Pressure (Exercise)  148/72    Blood Pressure (Exit)  112/62    Heart Rate (Admit)  84 bpm    Heart Rate (Exercise)  89 bpm    Heart Rate (Exit)  84 bpm    Oxygen Saturation (Admit)  100 %    Oxygen Saturation (Exercise)  100 %    Oxygen Saturation (Exit)  100 %    Rating of Perceived Exertion (Exercise)  13    Perceived Dyspnea (Exercise)  3    Duration  Continue with 30 min of aerobic exercise without signs/symptoms of physical distress.    Intensity  THRR unchanged      Progression   Progression  Continue to progress workloads to maintain intensity without signs/symptoms of physical distress.      Resistance Training   Training Prescription  Yes    Weight  orange bands    Reps   10-15    Time  10 Minutes      Bike   Level  1.5    Minutes  15    METs  1.9      NuStep   Level  3    SPM  80    Minutes  15    METs  1.6       Social History   Tobacco Use  Smoking Status Light Tobacco Smoker  . Packs/day: 0.10  . Types: Cigarettes  Smokeless Tobacco Never Used  Tobacco Comment   pt smokes 1 cigarrete occasionally. 1 pack "last a few months"    Goals Met:  Exercise tolerated well No report of cardiac concerns or symptoms Strength training completed today  Goals Unmet:  Not Applicable  Comments: Service time is from 1000 to 1150    Dr. Fransico Him is Medical Director for Cardiac Rehab at [facility].

## 2019-05-27 ENCOUNTER — Encounter (HOSPITAL_COMMUNITY)
Admission: RE | Admit: 2019-05-27 | Discharge: 2019-05-27 | Disposition: A | Discharge status: Home / Self Care | Payer: No Typology Code available for payment source | Source: Ambulatory Visit | Attending: Pulmonary Disease | Admitting: Pulmonary Disease

## 2019-05-27 ENCOUNTER — Other Ambulatory Visit: Payer: Self-pay

## 2019-05-27 DIAGNOSIS — J449 Chronic obstructive pulmonary disease, unspecified: Secondary | ICD-10-CM | POA: Diagnosis not present

## 2019-05-27 NOTE — Progress Notes (Signed)
Daily Session Note  Patient Details  Name: Brett Saunders MRN: [id number] Date of Birth: 15-Nov-1945 Referring Provider:     Pulmonary Rehab Walk Test from 02/22/2019 in [place]  Referring Provider  Dr. Gwenette Greet      Encounter Date: 05/27/2019  Check In: Session Check In - 05/27/19 1119      Check-In   Supervising physician immediately available to respond to emergencies  [facility] immediately available    Physician(s)  Dr. Dessa Phi    Location  [facility]    Staff Present  Rosebud Poles, RN, Bjorn Loser, MS, Exercise Physiologist;[name] Ysidro Evert, RN    Virtual Visit  No    Medication changes reported      No    Fall or balance concerns reported     No    Tobacco Cessation  No Change    Warm-up and Cool-down  Performed as group-led instruction    Resistance Training Performed  Yes    VAD Patient?  No    PAD/SET Patient?  No      Pain Assessment   Currently in Pain?  No/denies    Multiple Pain Sites  No       Capillary Blood Glucose: No results found for this or any previous visit (from the past 24 hour(s)).    Social History   Tobacco Use  Smoking Status Light Tobacco Smoker  . Packs/day: 0.10  . Types: Cigarettes  Smokeless Tobacco Never Used  Tobacco Comment   pt smokes 1 cigarrete occasionally. 1 pack "last a few months"    Goals Met:  Exercise tolerated well No report of cardiac concerns or symptoms Strength training completed today  Goals Unmet:  Not Applicable  Comments: Service time is from 1005 to 1100    Dr. Fransico Him is Medical Director for Cardiac Rehab at [facility].

## 2019-06-01 ENCOUNTER — Other Ambulatory Visit: Payer: Self-pay

## 2019-06-01 ENCOUNTER — Encounter (HOSPITAL_COMMUNITY)
Admission: RE | Admit: 2019-06-01 | Discharge: 2019-06-01 | Disposition: A | Discharge status: Home / Self Care | Payer: No Typology Code available for payment source | Source: Ambulatory Visit | Attending: Pulmonary Disease | Admitting: Pulmonary Disease

## 2019-06-01 DIAGNOSIS — J449 Chronic obstructive pulmonary disease, unspecified: Secondary | ICD-10-CM | POA: Diagnosis not present

## 2019-06-01 NOTE — Progress Notes (Signed)
Daily Session Note  Patient Details  Name: Brett Saunders MRN: [id number] Date of Birth: October 03, 1945 Referring Provider:     Pulmonary Rehab Walk Test from 02/22/2019 in [place]  Referring Provider  Dr. Gwenette Greet      Encounter Date: 06/01/2019  Check In: Session Check In - 06/01/19 1020      Check-In   Supervising physician immediately available to respond to emergencies  [facility] immediately available    Physician(s)  Dr. Dessa Phi    Location  [facility]    Staff Present  Rosebud Poles, RN, Bjorn Loser, MS, Exercise Physiologist;Aundrea Higginbotham Ysidro Evert, RN    Virtual Visit  No    Medication changes reported      No    Fall or balance concerns reported     No    Tobacco Cessation  No Change    Warm-up and Cool-down  Performed as group-led instruction    Resistance Training Performed  Yes    VAD Patient?  No    PAD/SET Patient?  No      Pain Assessment   Currently in Pain?  No/denies    Multiple Pain Sites  No       Capillary Blood Glucose: No results found for this or any previous visit (from the past 24 hour(s)).    Social History   Tobacco Use  Smoking Status Light Tobacco Smoker  . Packs/day: 0.10  . Types: Cigarettes  Smokeless Tobacco Never Used  Tobacco Comment   pt smokes 1 cigarrete occasionally. 1 pack "last a few months"    Goals Met:  Exercise tolerated well No report of cardiac concerns or symptoms Strength training completed today  Goals Unmet:  Not Applicable  Comments: Service time is from 1000 to 1055    Dr. Fransico Him is Medical Director for Cardiac Rehab at [facility].

## 2019-06-03 ENCOUNTER — Other Ambulatory Visit: Payer: Self-pay

## 2019-06-03 ENCOUNTER — Encounter (HOSPITAL_COMMUNITY)
Admission: RE | Admit: 2019-06-03 | Discharge: 2019-06-03 | Disposition: A | Discharge status: Home / Self Care | Payer: No Typology Code available for payment source | Source: Ambulatory Visit | Attending: Pulmonary Disease | Admitting: Pulmonary Disease

## 2019-06-03 DIAGNOSIS — J449 Chronic obstructive pulmonary disease, unspecified: Secondary | ICD-10-CM

## 2019-06-03 NOTE — Progress Notes (Signed)
Daily Session Note  Patient Details  Name: Brett Saunders MRN: [id number] Date of Birth: 10/24/1945 Referring Provider:     Pulmonary Rehab Walk Test from 02/22/2019 in [facility]  Referring Provider  Dr. Clance      Encounter Date: 06/03/2019  Check In: Session Check In - 06/03/19 1125      Check-In   Supervising physician immediately available to respond to emergencies  [facility] immediately available    Physician(s)  Dr. Lama    Location  [facility]    Staff Present  Joan Behrens, RN, BSN; , RN;Dalton Fletcher, MS, Exercise Physiologist    Virtual Visit  No    Medication changes reported      No    Fall or balance concerns reported     No    Tobacco Cessation  No Change    Warm-up and Cool-down  Performed as group-led instruction    Resistance Training Performed  Yes    VAD Patient?  No    PAD/SET Patient?  No      Pain Assessment   Currently in Pain?  No/denies    Multiple Pain Sites  No       Capillary Blood Glucose: No results found for this or any previous visit (from the past 24 hour(s)).    Social History   Tobacco Use  Smoking Status Light Tobacco Smoker  . Packs/day: 0.10  . Types: Cigarettes  Smokeless Tobacco Never Used  Tobacco Comment   pt smokes 1 cigarrete occasionally. 1 pack "last a few months"    Goals Met:  Exercise tolerated well No report of cardiac concerns or symptoms Strength training completed today  Goals Unmet:  Not Applicable  Comments: Service time is from 1000 to 1050    Dr. Traci Turner is Medical Director for Cardiac Rehab at [facility]. 

## 2019-06-08 ENCOUNTER — Encounter (HOSPITAL_COMMUNITY)
Admission: RE | Admit: 2019-06-08 | Discharge: 2019-06-08 | Disposition: A | Discharge status: Home / Self Care | Payer: No Typology Code available for payment source | Source: Ambulatory Visit | Attending: Pulmonary Disease | Admitting: Pulmonary Disease

## 2019-06-08 ENCOUNTER — Other Ambulatory Visit: Payer: Self-pay

## 2019-06-08 VITALS — Wt 121.5 lb

## 2019-06-08 DIAGNOSIS — J449 Chronic obstructive pulmonary disease, unspecified: Secondary | ICD-10-CM

## 2019-06-08 NOTE — Progress Notes (Signed)
Pulmonary Individual Treatment Plan  Patient Details  Name: Brett Saunders MRN: [id number] Date of Birth: 1945-06-03 Referring Provider:     Pulmonary Rehab Walk Test from 02/22/2019 in [place]  Referring Provider  Dr. Gwenette Greet      Initial Encounter Date:    Pulmonary Rehab Walk Test from 02/22/2019 in [place]  Date  02/22/19      Visit Diagnosis: Chronic obstructive pulmonary disease, unspecified COPD type ([place])  Patient's Home Medications on Admission:   Current Outpatient Medications:  .  acetaminophen (TYLENOL) 500 MG tablet, Take 500 mg by mouth 2 (two) times daily., Disp: , Rfl:  .  albuterol (PROVENTIL) (2.5 MG/3ML) 0.083% nebulizer solution, Take 2.5 mg by nebulization every 6 (six) hours as needed for wheezing or shortness of breath., Disp: , Rfl:  .  amiodarone (PACERONE) 200 MG tablet, Take 2 tablets (400 mg total) by mouth 2 (two) times daily. 2 tab twice daily for 2 weeks, 1 tablet twice daily for two weeks then one tablet daily, Disp: 90 tablet, Rfl: 1 .  apixaban (ELIQUIS) 5 MG TABS tablet, Take 2.5 mg by mouth 2 (two) times daily., Disp: , Rfl:  .  carvedilol (COREG) 3.125 MG tablet, Take 3.125 mg by mouth 2 (two) times daily with a meal., Disp: , Rfl:  .  diphenhydrAMINE (BENADRYL) 25 MG tablet, Take 25 mg by mouth at bedtime., Disp: , Rfl:  .  furosemide (LASIX) 20 MG tablet, Take 20 mg by mouth daily. , Disp: , Rfl:  .  Glycopyrrolate-Formoterol (BEVESPI AEROSPHERE) 9-4.8 MCG/ACT AERO, Inhale 2 puffs into the lungs 2 (two) times daily., Disp: , Rfl:  .  metoprolol succinate (TOPROL-XL) 25 MG 24 hr tablet, Take 25 mg by mouth daily., Disp: , Rfl:  .  OLODATEROL HCL IN, Inhale 2.5 mg into the lungs every morning. 2 puffs, Disp: , Rfl:  .  pentoxifylline (TRENTAL) 400 MG CR tablet, Take 400 mg by mouth 2 (two) times daily., Disp: , Rfl:  .  potassium chloride (KLOR-CON) 10 MEQ tablet, TAKE 1 TABLET BY  MOUTH EVERY DAY, Disp: 30 tablet, Rfl: 1 .  rOPINIRole (REQUIP) 0.25 MG tablet, Take 0.25 mg by mouth at bedtime. , Disp: , Rfl:  .  sodium chloride 1 g tablet, Take 1 g by mouth 3 (three) times daily., Disp: , Rfl:   Past Medical History: Past Medical History:  Diagnosis Date  . Aortic anomaly    aortic calcification  . Arrhythmia    a fib  . Cancer ([place])    lung  . Chronic obstructive lung disease ([place])   . Hypertension   . Ischemic cardiomyopathy   . Peripheral vascular disease ([place])   . Stroke [facility])    oct 2012    Tobacco Use: Social History   Tobacco Use  Smoking Status Light Tobacco Smoker  . Packs/day: 0.10  . Types: Cigarettes  Smokeless Tobacco Never Used  Tobacco Comment   pt smokes 1 cigarrete occasionally. 1 pack "last a few months"    Labs: Recent Review Flowsheet Data    There is no flowsheet data to display.      Capillary Blood Glucose: No results found for: GLUCAP   Pulmonary Assessment Scores: Pulmonary Assessment Scores    Row Name 02/22/19 1000 02/22/19 1030       ADL UCSD   ADL Phase  Entry  Entry    SOB Score total  45  --  CAT Score   CAT Score  22  --      mMRC Score   mMRC Score  --  2      UCSD: Self-administered rating of dyspnea associated with activities of daily living (ADLs) 6-point scale (0 = "not at all" to 5 = "maximal or unable to do because of breathlessness")  Scoring Scores range from 0 to 120.  Minimally important difference is 5 units  CAT: CAT can identify the health impairment of COPD patients and is better correlated with disease progression.  CAT has a scoring range of zero to 40. The CAT score is classified into four groups of low (less than 10), medium (10 - 20), high (21-30) and very high (31-40) based on the impact level of disease on health status. A CAT score over 10 suggests significant symptoms.  A worsening CAT score could be explained by an exacerbation, poor medication adherence, poor inhaler  technique, or progression of COPD or comorbid conditions.  CAT MCID is 2 points  mMRC: mMRC (Modified Medical Research Council) Dyspnea Scale is used to assess the degree of baseline functional disability in patients of respiratory disease due to dyspnea. No minimal important difference is established. A decrease in score of 1 point or greater is considered a positive change.   Pulmonary Function Assessment: Pulmonary Function Assessment - 02/22/19 0950      Breath   Bilateral Breath Sounds  Clear;Decreased    Shortness of Breath  Yes;Fear of Shortness of Breath;Limiting activity;Panic with Shortness of Breath       Exercise Target Goals: Exercise Program Goal: Individual exercise prescription set using results from initial 6 min walk test and THRR while considering  patient's activity barriers and safety.   Exercise Prescription Goal: Initial exercise prescription builds to 30-45 minutes a day of aerobic activity, 2-3 days per week.  Home exercise guidelines will be given to patient during program as part of exercise prescription that the participant will acknowledge.  Activity Barriers & Risk Stratification: Activity Barriers & Cardiac Risk Stratification - 02/22/19 0945      Activity Barriers & Cardiac Risk Stratification   Activity Barriers  Shortness of Breath;Muscular Weakness;Deconditioning;Back Problems       6 Minute Walk: 6 Minute Walk    Row Name 02/22/19 1030         6 Minute Walk   Phase  Initial     Distance  1055 feet     Walk Time  6 minutes     # of Rest Breaks  0     MPH  2     METS  3.02     RPE  12     Perceived Dyspnea   3     VO2 Peak  10.56     Symptoms  Yes (comment)     Comments  claudication in both calves     Resting HR  75 bpm     Resting BP  112/58     Resting Oxygen Saturation   98 %     Exercise Oxygen Saturation  during 6 min walk  94 %     Max Ex. HR  96 bpm     Max Ex. BP  140/68     2 Minute Post BP  108/64       Interval HR    1 Minute HR  96     2 Minute HR  96     3 Minute HR  95  4 Minute HR  94     5 Minute HR  95     6 Minute HR  95     2 Minute Post HR  85     Interval Heart Rate?  Yes       Interval Oxygen   Interval Oxygen?  Yes     Baseline Oxygen Saturation %  98 %     1 Minute Oxygen Saturation %  95 %     1 Minute Liters of Oxygen  0 L     2 Minute Oxygen Saturation %  94 %     2 Minute Liters of Oxygen  0 L     3 Minute Oxygen Saturation %  96 %     3 Minute Liters of Oxygen  0 L     4 Minute Oxygen Saturation %  94 %     4 Minute Liters of Oxygen  0 L     5 Minute Oxygen Saturation %  94 %     5 Minute Liters of Oxygen  0 L     6 Minute Oxygen Saturation %  95 %     6 Minute Liters of Oxygen  0 L     2 Minute Post Oxygen Saturation %  97 %     2 Minute Post Liters of Oxygen  0 L        Oxygen Initial Assessment: Oxygen Initial Assessment - 02/22/19 1029      Home Oxygen   Home Oxygen Device  Portable Concentrator;Home Concentrator;E-Tanks    Sleep Oxygen Prescription  Continuous    Home Exercise Oxygen Prescription  Pulsed    Liters per minute  2    Home at Rest Exercise Oxygen Prescription  None    Compliance with Home Oxygen Use  Yes      Initial 6 min Walk   Oxygen Used  None      Program Oxygen Prescription   Program Oxygen Prescription  None      Intervention   Short Term Goals  To learn and exhibit compliance with exercise, home and travel O2 prescription;To learn and understand importance of maintaining oxygen saturations>88%;To learn and demonstrate proper use of respiratory medications;To learn and understand importance of monitoring SPO2 with pulse oximeter and demonstrate accurate use of the pulse oximeter.;To learn and demonstrate proper pursed lip breathing techniques or other breathing techniques.    Long  Term Goals  Exhibits compliance with exercise, home and travel O2 prescription;Verbalizes importance of monitoring SPO2 with pulse oximeter and return  demonstration;Maintenance of O2 saturations>88%;Exhibits proper breathing techniques, such as pursed lip breathing or other method taught during program session;Compliance with respiratory medication;Demonstrates proper use of MDI's       Oxygen Re-Evaluation: Oxygen Re-Evaluation    Row Name 03/02/19 1147 05/04/19 0854 06/08/19 0709         Program Oxygen Prescription   Program Oxygen Prescription  None  None  None       Home Oxygen   Home Oxygen Device  Portable Concentrator;Home Concentrator;E-Tanks  Portable Concentrator;Home Concentrator;E-Tanks  Portable Concentrator;Home Concentrator;E-Tanks     Sleep Oxygen Prescription  Continuous  Continuous  Continuous     Liters per minute  2  2  2      Home Exercise Oxygen Prescription  Pulsed  Pulsed  None     Liters per minute  2  2  --     Home at Rest Exercise Oxygen Prescription  None  None  None     Compliance with Home Oxygen Use  Yes  Yes  Yes       Goals/Expected Outcomes   Short Term Goals  To learn and exhibit compliance with exercise, home and travel O2 prescription;To learn and understand importance of maintaining oxygen saturations>88%;To learn and demonstrate proper use of respiratory medications;To learn and understand importance of monitoring SPO2 with pulse oximeter and demonstrate accurate use of the pulse oximeter.;To learn and demonstrate proper pursed lip breathing techniques or other breathing techniques.  To learn and exhibit compliance with exercise, home and travel O2 prescription;To learn and understand importance of maintaining oxygen saturations>88%;To learn and demonstrate proper use of respiratory medications;To learn and understand importance of monitoring SPO2 with pulse oximeter and demonstrate accurate use of the pulse oximeter.;To learn and demonstrate proper pursed lip breathing techniques or other breathing techniques.  To learn and exhibit compliance with exercise, home and travel O2 prescription;To learn  and understand importance of maintaining oxygen saturations>88%;To learn and demonstrate proper use of respiratory medications;To learn and understand importance of monitoring SPO2 with pulse oximeter and demonstrate accurate use of the pulse oximeter.;To learn and demonstrate proper pursed lip breathing techniques or other breathing techniques.     Long  Term Goals  Exhibits compliance with exercise, home and travel O2 prescription;Verbalizes importance of monitoring SPO2 with pulse oximeter and return demonstration;Maintenance of O2 saturations>88%;Exhibits proper breathing techniques, such as pursed lip breathing or other method taught during program session;Compliance with respiratory medication;Demonstrates proper use of MDI's  Exhibits compliance with exercise, home and travel O2 prescription;Verbalizes importance of monitoring SPO2 with pulse oximeter and return demonstration;Maintenance of O2 saturations>88%;Exhibits proper breathing techniques, such as pursed lip breathing or other method taught during program session;Compliance with respiratory medication;Demonstrates proper use of MDI's  Exhibits compliance with exercise, home and travel O2 prescription;Verbalizes importance of monitoring SPO2 with pulse oximeter and return demonstration;Maintenance of O2 saturations>88%;Exhibits proper breathing techniques, such as pursed lip breathing or other method taught during program session;Compliance with respiratory medication;Demonstrates proper use of MDI's     Goals/Expected Outcomes  compliance   compliance   compliance         Oxygen Discharge (Final Oxygen Re-Evaluation): Oxygen Re-Evaluation - 06/08/19 0709      Program Oxygen Prescription   Program Oxygen Prescription  None      Home Oxygen   Home Oxygen Device  Portable Concentrator;Home Concentrator;E-Tanks    Sleep Oxygen Prescription  Continuous    Liters per minute  2    Home Exercise Oxygen Prescription  None    Home at Rest  Exercise Oxygen Prescription  None    Compliance with Home Oxygen Use  Yes      Goals/Expected Outcomes   Short Term Goals  To learn and exhibit compliance with exercise, home and travel O2 prescription;To learn and understand importance of maintaining oxygen saturations>88%;To learn and demonstrate proper use of respiratory medications;To learn and understand importance of monitoring SPO2 with pulse oximeter and demonstrate accurate use of the pulse oximeter.;To learn and demonstrate proper pursed lip breathing techniques or other breathing techniques.    Long  Term Goals  Exhibits compliance with exercise, home and travel O2 prescription;Verbalizes importance of monitoring SPO2 with pulse oximeter and return demonstration;Maintenance of O2 saturations>88%;Exhibits proper breathing techniques, such as pursed lip breathing or other method taught during program session;Compliance with respiratory medication;Demonstrates proper use of MDI's    Goals/Expected Outcomes  compliance  Initial Exercise Prescription: Initial Exercise Prescription - 02/22/19 1000      Date of Initial Exercise RX and Referring Provider   Date  02/22/19    Referring Provider  Dr. Gwenette Greet      Treadmill   MPH  1.7    Grade  1    Minutes  15      Bike   Level  2    Minutes  15      Prescription Details   Frequency (times per week)  2    Duration  Progress to 30 minutes of continuous aerobic without signs/symptoms of physical distress      Intensity   THRR 40-80% of Max Heartrate  58-116    Ratings of Perceived Exertion  11-13    Perceived Dyspnea  0-4      Progression   Progression  Continue to progress workloads to maintain intensity without signs/symptoms of physical distress.      Resistance Training   Training Prescription  Yes    Weight  orange bands    Reps  10-15       Perform Capillary Blood Glucose checks as needed.  Exercise Prescription Changes: Exercise Prescription Changes     Row Name 03/02/19 1100 03/09/19 1100 03/16/19 1100 05/11/19 1100 05/25/19 1100     Response to Exercise   Blood Pressure (Admit)  108/52  --  110/50  124/56  114/64   Blood Pressure (Exercise)  144/64  --  132/64  122/74  148/72   Blood Pressure (Exit)  102/64  --  104/68  116/60  112/62   Heart Rate (Admit)  85 bpm  --  68 bpm  79 bpm  84 bpm   Heart Rate (Exercise)  94 bpm  --  90 bpm  80 bpm  89 bpm   Heart Rate (Exit)  74 bpm  --  68 bpm  80 bpm  84 bpm   Oxygen Saturation (Admit)  98 %  --  100 %  100 %  100 %   Oxygen Saturation (Exercise)  97 %  --  98 %  98 %  100 %   Oxygen Saturation (Exit)  100 %  --  100 %  100 %  100 %   Rating of Perceived Exertion (Exercise)  13  --  12  12  13    Perceived Dyspnea (Exercise)  3  --  3  1  3    Duration  Continue with 30 min of aerobic exercise without signs/symptoms of physical distress.  --  Continue with 30 min of aerobic exercise without signs/symptoms of physical distress.  Continue with 30 min of aerobic exercise without signs/symptoms of physical distress.  Continue with 30 min of aerobic exercise without signs/symptoms of physical distress.   Intensity  THRR unchanged  --  THRR unchanged  THRR unchanged  THRR unchanged     Progression   Progression  --  --  Continue to progress workloads to maintain intensity without signs/symptoms of physical distress.  Continue to progress workloads to maintain intensity without signs/symptoms of physical distress.  Continue to progress workloads to maintain intensity without signs/symptoms of physical distress.     Resistance Training   Training Prescription  Yes  --  Yes  Yes  Yes   Weight  orange bands  --  orange bands  orange bands  orange bands   Reps  10-15  --  10-15  10-15  10-15   Time  10  Minutes  --  10 Minutes  10 Minutes  10 Minutes     Bike   Level  1  --  1.5  1.5  1.5   Minutes  15  --  15  15  15    METs  2.1  --  --  2  1.9     NuStep   Level  2  --  2  2  3    SPM  80  --  80   80  80   Minutes  15  --  15  15  15    METs  2.1  --  1.8  1.8  1.6     Home Exercise Plan   Plans to continue exercise at  --  [facility] [employer] (comment)  --  --  --   Frequency  --  Add 3 additional days to program exercise sessions.  --  --  --   Initial Home Exercises Provided  --  03/09/19  --  --  --      Exercise Comments: Exercise Comments    Row Name 03/09/19 1148           Exercise Comments  home exercise complete          Exercise Goals and Review: Exercise Goals    Row Name 02/22/19 1048 03/02/19 1147 05/04/19 0855 06/08/19 0709       Exercise Goals   Increase Physical Activity  Yes  Yes  Yes  Yes    Intervention  Provide advice, education, support and counseling about physical activity/exercise needs.;Develop an individualized exercise prescription for aerobic and resistive training based on initial evaluation findings, risk stratification, comorbidities and participant's personal goals.  Provide advice, education, support and counseling about physical activity/exercise needs.;Develop an individualized exercise prescription for aerobic and resistive training based on initial evaluation findings, risk stratification, comorbidities and participant's personal goals.  Provide advice, education, support and counseling about physical activity/exercise needs.;Develop an individualized exercise prescription for aerobic and resistive training based on initial evaluation findings, risk stratification, comorbidities and participant's personal goals.  Provide advice, education, support and counseling about physical activity/exercise needs.;Develop an individualized exercise prescription for aerobic and resistive training based on initial evaluation findings, risk stratification, comorbidities and participant's personal goals.    Expected Outcomes  Short Term: Attend rehab on a regular basis to increase amount of physical activity.;Long Term: Add in home exercise to make exercise part  of routine and to increase amount of physical activity.;Long Term: Exercising regularly at least 3-5 days a week.  Short Term: Attend rehab on a regular basis to increase amount of physical activity.;Long Term: Add in home exercise to make exercise part of routine and to increase amount of physical activity.;Long Term: Exercising regularly at least 3-5 days a week.  Short Term: Attend rehab on a regular basis to increase amount of physical activity.;Long Term: Add in home exercise to make exercise part of routine and to increase amount of physical activity.;Long Term: Exercising regularly at least 3-5 days a week.  Short Term: Attend rehab on a regular basis to increase amount of physical activity.;Long Term: Add in home exercise to make exercise part of routine and to increase amount of physical activity.;Long Term: Exercising regularly at least 3-5 days a week.    Increase Strength and Stamina  Yes  Yes  Yes  Yes    Intervention  Provide advice, education, support and counseling about physical activity/exercise needs.;Develop an individualized exercise prescription for aerobic and  resistive training based on initial evaluation findings, risk stratification, comorbidities and participant's personal goals.  Provide advice, education, support and counseling about physical activity/exercise needs.;Develop an individualized exercise prescription for aerobic and resistive training based on initial evaluation findings, risk stratification, comorbidities and participant's personal goals.  Provide advice, education, support and counseling about physical activity/exercise needs.;Develop an individualized exercise prescription for aerobic and resistive training based on initial evaluation findings, risk stratification, comorbidities and participant's personal goals.  Provide advice, education, support and counseling about physical activity/exercise needs.;Develop an individualized exercise prescription for aerobic and  resistive training based on initial evaluation findings, risk stratification, comorbidities and participant's personal goals.    Expected Outcomes  Short Term: Increase workloads from initial exercise prescription for resistance, speed, and METs.;Short Term: Perform resistance training exercises routinely during rehab and add in resistance training at home;Long Term: Improve cardiorespiratory fitness, muscular endurance and strength as measured by increased METs and functional capacity (6MWT)  Short Term: Increase workloads from initial exercise prescription for resistance, speed, and METs.;Short Term: Perform resistance training exercises routinely during rehab and add in resistance training at home;Long Term: Improve cardiorespiratory fitness, muscular endurance and strength as measured by increased METs and functional capacity (6MWT)  Short Term: Increase workloads from initial exercise prescription for resistance, speed, and METs.;Short Term: Perform resistance training exercises routinely during rehab and add in resistance training at home;Long Term: Improve cardiorespiratory fitness, muscular endurance and strength as measured by increased METs and functional capacity (6MWT)  Short Term: Increase workloads from initial exercise prescription for resistance, speed, and METs.;Short Term: Perform resistance training exercises routinely during rehab and add in resistance training at home;Long Term: Improve cardiorespiratory fitness, muscular endurance and strength as measured by increased METs and functional capacity (6MWT)    Able to understand and use rate of perceived exertion (RPE) scale  Yes  Yes  Yes  Yes    Intervention  Provide education and explanation on how to use RPE scale  Provide education and explanation on how to use RPE scale  Provide education and explanation on how to use RPE scale  Provide education and explanation on how to use RPE scale    Expected Outcomes  Short Term: Able to use RPE  daily in rehab to express subjective intensity level;Long Term:  Able to use RPE to guide intensity level when exercising independently  Short Term: Able to use RPE daily in rehab to express subjective intensity level;Long Term:  Able to use RPE to guide intensity level when exercising independently  Short Term: Able to use RPE daily in rehab to express subjective intensity level;Long Term:  Able to use RPE to guide intensity level when exercising independently  Short Term: Able to use RPE daily in rehab to express subjective intensity level;Long Term:  Able to use RPE to guide intensity level when exercising independently    Able to understand and use Dyspnea scale  Yes  Yes  Yes  Yes    Intervention  Provide education and explanation on how to use Dyspnea scale  Provide education and explanation on how to use Dyspnea scale  Provide education and explanation on how to use Dyspnea scale  Provide education and explanation on how to use Dyspnea scale    Expected Outcomes  Short Term: Able to use Dyspnea scale daily in rehab to express subjective sense of shortness of breath during exertion;Long Term: Able to use Dyspnea scale to guide intensity level when exercising independently  Short Term: Able to use Dyspnea  scale daily in rehab to express subjective sense of shortness of breath during exertion;Long Term: Able to use Dyspnea scale to guide intensity level when exercising independently  Short Term: Able to use Dyspnea scale daily in rehab to express subjective sense of shortness of breath during exertion;Long Term: Able to use Dyspnea scale to guide intensity level when exercising independently  Short Term: Able to use Dyspnea scale daily in rehab to express subjective sense of shortness of breath during exertion;Long Term: Able to use Dyspnea scale to guide intensity level when exercising independently    Knowledge and understanding of Target Heart Rate Range (THRR)  Yes  Yes  Yes  Yes    Intervention   Provide education and explanation of THRR including how the numbers were predicted and where they are located for reference  Provide education and explanation of THRR including how the numbers were predicted and where they are located for reference  Provide education and explanation of THRR including how the numbers were predicted and where they are located for reference  Provide education and explanation of THRR including how the numbers were predicted and where they are located for reference    Expected Outcomes  Short Term: Able to state/look up THRR;Long Term: Able to use THRR to govern intensity when exercising independently;Short Term: Able to use daily as guideline for intensity in rehab  Short Term: Able to state/look up THRR;Long Term: Able to use THRR to govern intensity when exercising independently;Short Term: Able to use daily as guideline for intensity in rehab  Short Term: Able to state/look up THRR;Long Term: Able to use THRR to govern intensity when exercising independently;Short Term: Able to use daily as guideline for intensity in rehab  Short Term: Able to state/look up THRR;Long Term: Able to use THRR to govern intensity when exercising independently;Short Term: Able to use daily as guideline for intensity in rehab    Understanding of Exercise Prescription  Yes  Yes  Yes  Yes    Intervention  Provide education, explanation, and written materials on patient's individual exercise prescription  Provide education, explanation, and written materials on patient's individual exercise prescription  Provide education, explanation, and written materials on patient's individual exercise prescription  Provide education, explanation, and written materials on patient's individual exercise prescription    Expected Outcomes  Short Term: Able to explain program exercise prescription;Long Term: Able to explain home exercise prescription to exercise independently  Short Term: Able to explain program exercise  prescription;Long Term: Able to explain home exercise prescription to exercise independently  Short Term: Able to explain program exercise prescription;Long Term: Able to explain home exercise prescription to exercise independently  Short Term: Able to explain program exercise prescription;Long Term: Able to explain home exercise prescription to exercise independently    Improve claudication pain toleration; Improve walking ability  Yes  Yes  Yes  Yes    Intervention  Participate in PAD/SET Rehab 2-3 days a week, walking at home as part of exercise prescription;Attend education sessions to aid in risk factor modification and understanding of disease process  Participate in PAD/SET Rehab 2-3 days a week, walking at home as part of exercise prescription;Attend education sessions to aid in risk factor modification and understanding of disease process  Participate in PAD/SET Rehab 2-3 days a week, walking at home as part of exercise prescription;Attend education sessions to aid in risk factor modification and understanding of disease process  Participate in PAD/SET Rehab 2-3 days a week, walking at home as part  of exercise prescription;Attend education sessions to aid in risk factor modification and understanding of disease process    Expected Outcomes  Short Term: Improve walking distance/time to onset of claudication pain;Long Term: Improve walking ability and toleration to claudication;Long Term: Improve score of PAD questionnaires  Short Term: Improve walking distance/time to onset of claudication pain;Long Term: Improve walking ability and toleration to claudication;Long Term: Improve score of PAD questionnaires  Short Term: Improve walking distance/time to onset of claudication pain;Long Term: Improve walking ability and toleration to claudication;Long Term: Improve score of PAD questionnaires  Short Term: Improve walking distance/time to onset of claudication pain;Long Term: Improve walking ability and  toleration to claudication;Long Term: Improve score of PAD questionnaires       Exercise Goals Re-Evaluation : Exercise Goals Re-Evaluation    Row Name 03/02/19 1148 05/04/19 0855 06/08/19 0651 06/08/19 0709       Exercise Goal Re-Evaluation   Exercise Goals Review  Increase Physical Activity;Able to understand and use rate of perceived exertion (RPE) scale;Increase Strength and Stamina;Able to understand and use Dyspnea scale;Knowledge and understanding of Target Heart Rate Range (THRR);Understanding of Exercise Prescription  Increase Physical Activity;Able to understand and use rate of perceived exertion (RPE) scale;Increase Strength and Stamina;Able to understand and use Dyspnea scale;Knowledge and understanding of Target Heart Rate Range (THRR);Understanding of Exercise Prescription  --  Increase Physical Activity;Able to understand and use rate of perceived exertion (RPE) scale;Increase Strength and Stamina;Able to understand and use Dyspnea scale;Knowledge and understanding of Target Heart Rate Range (THRR);Understanding of Exercise Prescription    Comments  Pt completed first exercise session. Pt was unable to walk on the TM due to fatigue. Pt exercised at 2.1 METs on the stepper. Will continue to monitor and progress as able.  Pt has been riding a stationary bike at home since our cessation of in person exercise. Pt will return to exercise 2/23. Will continue to monitor and progress.  --  Pt has attended 12 exercise sessions. Pt is not very welcoming of workload increases and I believe that pt could push himself harder than he does. Progress has been minimal. Pt currently exercises at 1.6 METs on the stepper. WIll continue to monitor and progress as able.    Expected Outcomes  Through exercise at rehab and at home, the patient will decrease shortness of breath with daily activities and feel confident in carrying out an exercise regime at home.   Through exercise at rehab and at home, the patient  will decrease shortness of breath with daily activities and feel confident in carrying out an exercise regime at home.   Through exercise at rehab and at home, the patient will decrease shortness of breath with daily activities and feel confident in carrying out an exercise regime at home.   Through exercise at rehab and at home, the patient will decrease shortness of breath with daily activities and feel confident in carrying out an exercise regime at home.        Discharge Exercise Prescription (Final Exercise Prescription Changes): Exercise Prescription Changes - 05/25/19 1100      Response to Exercise   Blood Pressure (Admit)  114/64    Blood Pressure (Exercise)  148/72    Blood Pressure (Exit)  112/62    Heart Rate (Admit)  84 bpm    Heart Rate (Exercise)  89 bpm    Heart Rate (Exit)  84 bpm    Oxygen Saturation (Admit)  100 %    Oxygen Saturation (Exercise)  100 %    Oxygen Saturation (Exit)  100 %    Rating of Perceived Exertion (Exercise)  13    Perceived Dyspnea (Exercise)  3    Duration  Continue with 30 min of aerobic exercise without signs/symptoms of physical distress.    Intensity  THRR unchanged      Progression   Progression  Continue to progress workloads to maintain intensity without signs/symptoms of physical distress.      Resistance Training   Training Prescription  Yes    Weight  orange bands    Reps  10-15    Time  10 Minutes      Bike   Level  1.5    Minutes  15    METs  1.9      NuStep   Level  3    SPM  80    Minutes  15    METs  1.6       Nutrition:  Target Goals: Understanding of nutrition guidelines, daily intake of sodium 1500mg , cholesterol 200mg , calories 30% from fat and 7% or less from saturated fats, daily to have 5 or more servings of fruits and vegetables.  Biometrics: Pre Biometrics - 02/22/19 0948      Pre Biometrics   Height  5\' 11"  (1.803 m)    Weight  53.1 kg    BMI (Calculated)  16.33    Grip Strength  24 kg         Nutrition Therapy Plan and Nutrition Goals: Nutrition Therapy & Goals - 03/04/19 1332      Nutrition Therapy   Diet  high protein, high calorie      Personal Nutrition Goals   Nutrition Goal  Pt to drink 3 ensures/day and add fats to meals and snacks    Personal Goal #2  Identify food quantities necessary to achieve wt gain of  -2# per week to a goal wt gain of 5-24 lb at graduation from pulmonary rehab.      Intervention Plan   Intervention  Prescribe, educate and counsel regarding individualized specific dietary modifications aiming towards targeted core components such as weight, hypertension, lipid management, diabetes, heart failure and other comorbidities.    Expected Outcomes  Long Term Goal: Adherence to prescribed nutrition plan.;Short Term Goal: A plan has been developed with personal nutrition goals set during dietitian appointment.       Nutrition Assessments: Nutrition Assessments - 03/04/19 1335      Rate Your Plate Scores   Pre Score  30       Nutrition Goals Re-Evaluation: Nutrition Goals Re-Evaluation    [place] Name 03/04/19 1334 06/04/19 0825           Goals   Current Weight  118 lb (53.5 kg)  121 lb (54.9 kg)      Nutrition Goal  Pt to drink 3 ensures/day and add fats to meals and snacks  Pt to drink 3 ensures/day and add fats to meals and snacks      Comment  --  Weight gain 3 lbs since starting cardiac rehab, pt reports more energy        Personal Goal #2 Re-Evaluation   Personal Goal #2  Identify food quantities necessary to achieve wt gain of  -2# per week to a goal wt gain of 5-24 lb at graduation from pulmonary rehab.  Identify food quantities necessary to achieve wt gain of  -2# per week to a goal wt gain of 5-24 lb  at graduation from pulmonary rehab.         Nutrition Goals Discharge (Final Nutrition Goals Re-Evaluation): Nutrition Goals Re-Evaluation - 06/04/19 0825      Goals   Current Weight  121 lb (54.9 kg)    Nutrition Goal  Pt to  drink 3 ensures/day and add fats to meals and snacks    Comment  Weight gain 3 lbs since starting cardiac rehab, pt reports more energy      Personal Goal #2 Re-Evaluation   Personal Goal #2  Identify food quantities necessary to achieve wt gain of  -2# per week to a goal wt gain of 5-24 lb at graduation from pulmonary rehab.       Psychosocial: Target Goals: Acknowledge presence or absence of significant depression and/or stress, maximize coping skills, provide positive support system. Participant is able to verbalize types and ability to use techniques and skills needed for reducing stress and depression.  Initial Review & Psychosocial Screening: Initial Psych Review & Screening - 02/22/19 1001      Initial Review   Current issues with  None Identified      Family Dynamics   Good Support System?  Yes      Barriers   Psychosocial barriers to participate in program  There are no identifiable barriers or psychosocial needs.      Screening Interventions   Interventions  Encouraged to exercise       Quality of Life Scores:  Scores of 19 and below usually indicate a poorer quality of life in these areas.  A difference of  2-3 points is a clinically meaningful difference.  A difference of 2-3 points in the total score of the Quality of Life Index has been associated with significant improvement in overall quality of life, self-image, physical symptoms, and general health in studies assessing change in quality of life.  PHQ-9: Recent Review Flowsheet Data    Depression screen [facility] 2/9 02/22/2019 03/13/2018 03/13/2018   Decreased Interest 0 0 0   Down, Depressed, Hopeless 0 0 0   PHQ - 2 Score 0 0 0   Altered sleeping 1 0 0   Tired, decreased energy 0 3 3   Change in appetite 0 3 3   Feeling bad or failure about yourself  0 0 0   Trouble concentrating 0 0 0   Moving slowly or fidgety/restless 0 0 0   Suicidal thoughts 0 0 0   PHQ-9 Score - 6 6   Difficult doing work/chores Not  difficult at all Somewhat difficult Somewhat difficult     Interpretation of Total Score  Total Score Depression Severity:  1-4 = Minimal depression, 5-9 = Mild depression, 10-14 = Moderate depression, 15-19 = Moderately severe depression, 20-27 = Severe depression   Psychosocial Evaluation and Intervention: Psychosocial Evaluation - 02/22/19 1001      Psychosocial Evaluation & Interventions   Interventions  Encouraged to exercise with the program and follow exercise prescription    Continue Psychosocial Services   No Follow up required       Psychosocial Re-Evaluation: Psychosocial Re-Evaluation    Row Name 02/22/19 1001 03/02/19 1150 05/03/19 1338 06/07/19 1308       Psychosocial Re-Evaluation   Current issues with  None Identified  None Identified  None Identified  None Identified    Comments  --  --  I have called patient weekly since department closure, which has been 7 weeks.  He has been exercising at home with a  gym system he has purchased.  His mental well being has been stable.  No psychosocial concerns identified at this time.    Expected Outcomes  --  --  Continued mental well-being.  For patient to have no barriers or psychosocial concerns while participating in pulmonary rehab.    Interventions  Encouraged to attend Pulmonary Rehabilitation for the exercise  Encouraged to attend Pulmonary Rehabilitation for the exercise  Encouraged to attend Pulmonary Rehabilitation for the exercise  Encouraged to attend Pulmonary Rehabilitation for the exercise    Continue Psychosocial Services   No Follow up required  No Follow up required  No Follow up required  No Follow up required       Psychosocial Discharge (Final Psychosocial Re-Evaluation): Psychosocial Re-Evaluation - 06/07/19 1308      Psychosocial Re-Evaluation   Current issues with  None Identified    Comments  No psychosocial concerns identified at this time.    Expected Outcomes  For patient to have no barriers or  psychosocial concerns while participating in pulmonary rehab.    Interventions  Encouraged to attend Pulmonary Rehabilitation for the exercise    Continue Psychosocial Services   No Follow up required       Education: Education Goals: Education classes will be provided on a weekly basis, covering required topics. Participant will state understanding/return demonstration of topics presented.  Learning Barriers/Preferences: Learning Barriers/Preferences - 02/22/19 1002      Learning Barriers/Preferences   Learning Barriers  None    Learning Preferences  Audio;Computer/Internet;Group Instruction;Individual Instruction;Pictoral;Skilled Demonstration;Verbal Instruction;Video;Written Material       Education Topics: Risk Factor Reduction:  -Group instruction that is supported by a PowerPoint presentation. Instructor discusses the definition of a risk factor, different risk factors for pulmonary disease, and how the heart and lungs work together.     Nutrition for Pulmonary Patient:  -Group instruction provided by PowerPoint slides, verbal discussion, and written materials to support subject matter. The instructor gives an explanation and review of healthy diet recommendations, which includes a discussion on weight management, recommendations for fruit and vegetable consumption, as well as protein, fluid, caffeine, fiber, sodium, sugar, and alcohol. Tips for eating when patients are short of breath are discussed.   PULMONARY REHAB CHRONIC OBSTRUCTIVE PULMONARY DISEASE from 03/09/2019 in [place]  Date  03/09/19  Educator  -- [[place]]      Pursed Lip Breathing:  -Group instruction that is supported by demonstration and informational handouts. Instructor discusses the benefits of pursed lip and diaphragmatic breathing and detailed demonstration on how to preform both.     Oxygen Safety:  -Group instruction provided by PowerPoint, verbal discussion, and  written material to support subject matter. There is an overview of "What is Oxygen" and "Why do we need it".  Instructor also reviews how to create a safe environment for oxygen use, the importance of using oxygen as prescribed, and the risks of noncompliance. There is a brief discussion on traveling with oxygen and resources the patient may utilize.   PULMONARY REHAB CHRONIC OBSTRUCTIVE PULMONARY DISEASE from 05/28/2018 in [place]  Date  04/23/18  Educator  Cloyde Reams  Instruction Review Code  1- Verbalizes Understanding      Oxygen Equipment:  -Group instruction provided by [employer] utilizing handouts, written materials, and Insurance underwriter.   Signs and Symptoms:  -Group instruction provided by written material and verbal discussion to support subject matter. Warning signs and symptoms of infection, stroke,  and heart attack are reviewed and when to call the physician/911 reinforced. Tips for preventing the spread of infection discussed.   PULMONARY REHAB CHRONIC OBSTRUCTIVE PULMONARY DISEASE from 05/28/2018 in [place]  Date  05/14/18  Educator  Remo Lipps  Instruction Review Code  1- Verbalizes Understanding      Advanced Directives:  -Group instruction provided by verbal instruction and written material to support subject matter. Instructor reviews Advanced Directive laws and proper instruction for filling out document.   Pulmonary Video:  -Group video education that reviews the importance of medication and oxygen compliance, exercise, good nutrition, pulmonary hygiene, and pursed lip and diaphragmatic breathing for the pulmonary patient.   Exercise for the Pulmonary Patient:  -Group instruction that is supported by a PowerPoint presentation. Instructor discusses benefits of exercise, core components of exercise, frequency, duration, and intensity of an exercise routine, importance of utilizing pulse oximetry  during exercise, safety while exercising, and options of places to exercise outside of rehab.     PULMONARY REHAB CHRONIC OBSTRUCTIVE PULMONARY DISEASE from 05/28/2018 in [place]  Date  05/07/18  Educator  Kirk Ruths  Instruction Review Code  1- Verbalizes Understanding      Pulmonary Medications:  -Verbally interactive group education provided by instructor with focus on inhaled medications and proper administration.   PULMONARY REHAB CHRONIC OBSTRUCTIVE PULMONARY DISEASE from 05/27/2019 in [place]  Date  05/27/19  Educator  -- [Handout]      Anatomy and Physiology of the Respiratory System and Intimacy:  -Group instruction provided by PowerPoint, verbal discussion, and written material to support subject matter. Instructor reviews respiratory cycle and anatomical components of the respiratory system and their functions. Instructor also reviews differences in obstructive and restrictive respiratory diseases with examples of each. Intimacy, Sex, and Sexuality differences are reviewed with a discussion on how relationships can change when diagnosed with pulmonary disease. Common sexual concerns are reviewed.   PULMONARY REHAB CHRONIC OBSTRUCTIVE PULMONARY DISEASE from 05/28/2018 in [place]  Date  05/28/18  Educator  rn  Instruction Review Code  1- Verbalizes Understanding      MD DAY -A group question and answer session with a medical doctor that allows participants to ask questions that relate to their pulmonary disease state.   OTHER EDUCATION -Group or individual verbal, written, or video instructions that support the educational goals of the pulmonary rehab program.   PULMONARY REHAB CHRONIC OBSTRUCTIVE PULMONARY DISEASE from 05/27/2019 in [place]  Date  05/20/19  Educator  DF Gwyndolyn Kaufman your numbers]  Instruction Review Code  2- Demonstrated  Understanding      Holiday Eating Survival Tips:  -Group instruction provided by PowerPoint slides, verbal discussion, and written materials to support subject matter. The instructor gives patients tips, tricks, and techniques to help them not only survive but enjoy the holidays despite the onslaught of food that accompanies the holidays.   Knowledge Questionnaire Score: Knowledge Questionnaire Score - 02/22/19 1002      Knowledge Questionnaire Score   Pre Score  16/18       Core Components/Risk Factors/Patient Goals at Admission: Personal Goals and Risk Factors at Admission - 02/22/19 1002      Core Components/Risk Factors/Patient Goals on Admission    Weight Management  Weight Gain    Improve shortness of breath with ADL's  Yes    Intervention  Provide education, individualized exercise plan and  daily activity instruction to help decrease symptoms of SOB with activities of daily living.    Expected Outcomes  Short Term: Improve cardiorespiratory fitness to achieve a reduction of symptoms when performing ADLs;Long Term: Be able to perform more ADLs without symptoms or delay the onset of symptoms       Core Components/Risk Factors/Patient Goals Review:  Goals and Risk Factor Review    Row Name 02/22/19 1003 03/02/19 1151 05/03/19 1340 06/07/19 1310       Core Components/Risk Factors/Patient Goals Review   Personal Goals Review  Improve shortness of breath with ADL's;Increase knowledge of respiratory medications and ability to use respiratory devices properly.;Develop more efficient breathing techniques such as purse lipped breathing and diaphragmatic breathing and practicing self-pacing with activity.;Weight Management/Obesity weight gain needed  Weight Management/Obesity;Develop more efficient breathing techniques such as purse lipped breathing and diaphragmatic breathing and practicing self-pacing with activity.;Increase knowledge of respiratory medications and ability to use  respiratory devices properly.;Improve shortness of breath with ADL's  Weight Management/Obesity;Develop more efficient breathing techniques such as purse lipped breathing and diaphragmatic breathing and practicing self-pacing with activity.;Increase knowledge of respiratory medications and ability to use respiratory devices properly.;Improve shortness of breath with ADL's  Develop more efficient breathing techniques such as purse lipped breathing and diaphragmatic breathing and practicing self-pacing with activity.;Increase knowledge of respiratory medications and ability to use respiratory devices properly.;Improve shortness of breath with ADL's    Review  --  Just started program today, is very deconditioned, unable to walk on the treadmill for a sustained 15 minutes, changed equipment to upright bike and nustep.  Also suggested he should eat breakfast before exercising.  Department closure for 7 weeks, patient will begin in person exercise in pulmonary rehab 05/11/2019.  Will begin working on shortness of breath and deconditioning.  [name] has completed 12 exercise sessions and is very slow to progress due to his COPD.  He is exercising on the [employer] bike at level 1.5 and it is hard for him, and level 3 on the nustep.  He continues to be underweight despite multiple discussions with our nutritionist.    Expected Outcomes  --  See admission goals.  See admission goals.  See admission goals.       Core Components/Risk Factors/Patient Goals at Discharge (Final Review):  Goals and Risk Factor Review - 06/07/19 1310      Core Components/Risk Factors/Patient Goals Review   Personal Goals Review  Develop more efficient breathing techniques such as purse lipped breathing and diaphragmatic breathing and practicing self-pacing with activity.;Increase knowledge of respiratory medications and ability to use respiratory devices properly.;Improve shortness of breath with ADL's    Review  [name] has completed 12  exercise sessions and is very slow to progress due to his COPD.  He is exercising on the Sci-[employer] bike at level 1.5 and it is hard for him, and level 3 on the nustep.  He continues to be underweight despite multiple discussions with our nutritionist.    Expected Outcomes  See admission goals.       ITP Comments:   Comments: ITP REVIEW Pt is making expected progress toward pulmonary rehab goals after completing 12 sessions. Recommend continued exercise, life style modification, education, and utilization of breathing techniques to increase stamina and strength and decrease shortness of breath with exertion.

## 2019-06-08 NOTE — Progress Notes (Signed)
Daily Session Note  Patient Details  Name: Brett Saunders MRN: [id number] Date of Birth: 07-05-1945 Referring Provider:     Pulmonary Rehab Walk Test from 02/22/2019 in [place]  Referring Provider  Dr. Gwenette Greet      Encounter Date: 06/08/2019  Check In: Session Check In - 06/08/19 1009      Check-In   Supervising physician immediately available to respond to emergencies  [facility] immediately available    Physician(s)  Dr. Darrick Meigs    Location  [facility]    Staff Present  Rosebud Poles, RN, BSN;Lisa Ysidro Evert, RN;Whitnie Deleon, MS, Exercise Physiologist    Virtual Visit  No    Medication changes reported      No    Fall or balance concerns reported     No    Tobacco Cessation  No Change    Warm-up and Cool-down  Performed as group-led instruction    Resistance Training Performed  Yes    VAD Patient?  No    PAD/SET Patient?  No      Pain Assessment   Currently in Pain?  No/denies    Multiple Pain Sites  No       Capillary Blood Glucose: No results found for this or any previous visit (from the past 24 hour(s)).  Exercise Prescription Changes - 06/08/19 1200      Response to Exercise   Blood Pressure (Admit)  118/70    Blood Pressure (Exercise)  132/76    Blood Pressure (Exit)  104/56    Heart Rate (Admit)  83 bpm    Heart Rate (Exercise)  75 bpm    Heart Rate (Exit)  69 bpm    Oxygen Saturation (Admit)  97 %    Oxygen Saturation (Exercise)  94 %    Oxygen Saturation (Exit)  96 %    Rating of Perceived Exertion (Exercise)  13    Perceived Dyspnea (Exercise)  3    Duration  Continue with 30 min of aerobic exercise without signs/symptoms of physical distress.    Intensity  THRR unchanged      Progression   Progression  Continue to progress workloads to maintain intensity without signs/symptoms of physical distress.      Resistance Training   Training Prescription  Yes    Weight  orange bands    Reps  10-15     Time  10 Minutes      Bike   Level  2    Minutes  15      NuStep   Level  3    SPM  80    Minutes  15    METs  1.7       Social History   Tobacco Use  Smoking Status Light Tobacco Smoker  . Packs/day: 0.10  . Types: Cigarettes  Smokeless Tobacco Never Used  Tobacco Comment   pt smokes 1 cigarrete occasionally. 1 pack "last a few months"    Goals Met:  Independence with exercise equipment Exercise tolerated well Strength training completed today  Goals Unmet:  Not Applicable  Comments: Service time is from 1000 to 1050    Dr. Fransico Him is Medical Director for Cardiac Rehab at [facility].

## 2019-06-10 ENCOUNTER — Encounter (HOSPITAL_COMMUNITY)
Admission: RE | Admit: 2019-06-10 | Discharge: 2019-06-10 | Disposition: A | Discharge status: Home / Self Care | Payer: No Typology Code available for payment source | Source: Ambulatory Visit | Attending: Pulmonary Disease | Admitting: Pulmonary Disease

## 2019-06-10 ENCOUNTER — Other Ambulatory Visit: Payer: Self-pay

## 2019-06-10 DIAGNOSIS — J449 Chronic obstructive pulmonary disease, unspecified: Secondary | ICD-10-CM

## 2019-06-10 NOTE — Progress Notes (Signed)
Daily Session Note  Patient Details  Name: Brett Saunders MRN: [id number] Date of Birth: April 11, 1945 Referring Provider:     Pulmonary Rehab Walk Test from 02/22/2019 in [place]  Referring Provider  Dr. Gwenette Greet      Encounter Date: 06/10/2019  Check In: Session Check In - 06/10/19 1149      Check-In   Supervising physician immediately available to respond to emergencies  [facility] immediately available    Physician(s)  Dr. Marylyn Ishihara    Location  [facility]    Staff Present  Rosebud Poles, RN, Bjorn Loser, MS, Exercise Physiologist    Virtual Visit  No    Medication changes reported      No    Fall or balance concerns reported     No    Tobacco Cessation  No Change    Warm-up and Cool-down  Performed as group-led instruction    Resistance Training Performed  Yes    VAD Patient?  No    PAD/SET Patient?  No      Pain Assessment   Currently in Pain?  No/denies    Multiple Pain Sites  No       Capillary Blood Glucose: No results found for this or any previous visit (from the past 24 hour(s)).    Social History   Tobacco Use  Smoking Status Light Tobacco Smoker  . Packs/day: 0.10  . Types: Cigarettes  Smokeless Tobacco Never Used  Tobacco Comment   pt smokes 1 cigarrete occasionally. 1 pack "last a few months"    Goals Met:  Proper associated with RPD/PD & O2 Sat Exercise tolerated well Strength training completed today  Goals Unmet:  Not Applicable  Comments: Service time is from 1005 to 1100.    Dr. Fransico Him is Medical Director for Cardiac Rehab at [facility].

## 2019-06-15 ENCOUNTER — Other Ambulatory Visit: Payer: Self-pay

## 2019-06-15 ENCOUNTER — Encounter (HOSPITAL_COMMUNITY)
Admission: RE | Admit: 2019-06-15 | Discharge: 2019-06-15 | Disposition: A | Discharge status: Home / Self Care | Payer: No Typology Code available for payment source | Source: Ambulatory Visit | Attending: Pulmonary Disease | Admitting: Pulmonary Disease

## 2019-06-15 DIAGNOSIS — J449 Chronic obstructive pulmonary disease, unspecified: Secondary | ICD-10-CM | POA: Diagnosis not present

## 2019-06-15 NOTE — Progress Notes (Signed)
Daily Session Note  Patient Details  Name: Marks Scalera MRN: [id number] Date of Birth: 1945-10-20 Referring Provider:     Pulmonary Rehab Walk Test from 02/22/2019 in [place]  Referring Provider  Dr. Gwenette Greet      Encounter Date: 06/15/2019  Check In: Session Check In - 06/15/19 1137      Check-In   Supervising physician immediately available to respond to emergencies  [facility] immediately available    Physician(s)  Dr. Sharlet Salina    Location  [facility]    Staff Present  Rosebud Poles, RN, Bjorn Loser, MS, Exercise Physiologist;Keeshawn Fakhouri Ysidro Evert, RN    Virtual Visit  No    Medication changes reported      No    Fall or balance concerns reported     No    Tobacco Cessation  No Change    Warm-up and Cool-down  Performed as group-led instruction    Resistance Training Performed  Yes    VAD Patient?  No    PAD/SET Patient?  No      Pain Assessment   Currently in Pain?  No/denies    Multiple Pain Sites  No       Capillary Blood Glucose: No results found for this or any previous visit (from the past 24 hour(s)).    Social History   Tobacco Use  Smoking Status Light Tobacco Smoker  . Packs/day: 0.10  . Types: Cigarettes  Smokeless Tobacco Never Used  Tobacco Comment   pt smokes 1 cigarrete occasionally. 1 pack "last a few months"    Goals Met:  Exercise tolerated well No report of cardiac concerns or symptoms Strength training completed today  Goals Unmet:  Not Applicable  Comments: Service time is from 1005 to 47    Dr. Fransico Him is Medical Director for Cardiac Rehab at [facility].

## 2019-06-17 ENCOUNTER — Other Ambulatory Visit: Payer: Self-pay

## 2019-06-17 ENCOUNTER — Encounter (HOSPITAL_COMMUNITY)
Admission: RE | Admit: 2019-06-17 | Discharge: 2019-06-17 | Disposition: A | Discharge status: Home / Self Care | Payer: No Typology Code available for payment source | Source: Ambulatory Visit | Attending: Pulmonary Disease | Admitting: Pulmonary Disease

## 2019-06-17 DIAGNOSIS — J449 Chronic obstructive pulmonary disease, unspecified: Secondary | ICD-10-CM | POA: Diagnosis not present

## 2019-07-15 NOTE — Progress Notes (Signed)
Discharge Progress Report  Patient Details  Name: Brett Saunders MRN: [id number] Date of Birth: 01-10-1946 Referring Provider:     Pulmonary Rehab Walk Test from 02/22/2019 in [facility]  Referring Provider  Dr. Gwenette Greet       Number of Visits: 16  Reason for Discharge:  Patient reached a stable level of exercise. Patient independent in their exercise. Patient has met program and personal goals.  Smoking History:  Social History   Tobacco Use  Smoking Status Light Tobacco Smoker  . Packs/day: 0.10  . Types: Cigarettes  Smokeless Tobacco Never Used  Tobacco Comment   pt smokes 1 cigarrete occasionally. 1 pack "last a few months"    Diagnosis:  Chronic obstructive pulmonary disease, unspecified COPD type ([place])  ADL [facility]: Pulmonary Assessment Scores    Row Name 02/22/19 1000 02/22/19 1030 06/17/19 1126     ADL [facility]   ADL Phase  Entry  Entry  Exit   SOB Score total  45  --  --     CAT Score   CAT Score  22  --  --     mMRC Score   mMRC Score  --  2  1      Initial Exercise Prescription: Initial Exercise Prescription - 02/22/19 1000      Date of Initial Exercise RX and Referring Provider   Date  02/22/19    Referring Provider  Dr. Gwenette Greet      Treadmill   MPH  1.7    Grade  1    Minutes  15      Bike   Level  2    Minutes  15      Prescription Details   Frequency (times per week)  2    Duration  Progress to 30 minutes of continuous aerobic without signs/symptoms of physical distress      Intensity   THRR 40-80% of Max Heartrate  58-116    Ratings of Perceived Exertion  11-13    Perceived Dyspnea  0-4      Progression   Progression  Continue to progress workloads to maintain intensity without signs/symptoms of physical distress.      Resistance Training   Training Prescription  Yes    Weight  orange bands    Reps  10-15       Discharge Exercise Prescription (Final Exercise Prescription Changes): Exercise  Prescription Changes - 06/08/19 1200      Response to Exercise   Blood Pressure (Admit)  118/70    Blood Pressure (Exercise)  132/76    Blood Pressure (Exit)  104/56    Heart Rate (Admit)  83 bpm    Heart Rate (Exercise)  75 bpm    Heart Rate (Exit)  69 bpm    Oxygen Saturation (Admit)  97 %    Oxygen Saturation (Exercise)  94 %    Oxygen Saturation (Exit)  96 %    Rating of Perceived Exertion (Exercise)  13    Perceived Dyspnea (Exercise)  3    Duration  Continue with 30 min of aerobic exercise without signs/symptoms of physical distress.    Intensity  THRR unchanged      Progression   Progression  Continue to progress workloads to maintain intensity without signs/symptoms of physical distress.      Resistance Training   Training Prescription  Yes    Weight  orange bands    Reps  10-15    Time  10 Minutes      Bike   Level  2    Minutes  15      NuStep   Level  3    SPM  80    Minutes  15    METs  1.7       Functional Capacity: 6 Minute Walk    Row Name 02/22/19 1030 06/17/19 1128       6 Minute Walk   Phase  Initial  Discharge    Distance  1055 feet  1078 feet    Distance Feet Change  --  23 ft    Walk Time  6 minutes  6 minutes    # of Rest Breaks  0  0    MPH  2  2.04    METS  3.02  3.17    RPE  12  13    Perceived Dyspnea   3  3    VO2 Peak  10.56  11.08    Symptoms  Yes (comment)  Yes (comment)    Comments  claudication in both calves  claudication in both calves. "Not as bad as it used to be"    Resting HR  75 bpm  80 bpm    Resting BP  112/58  112/56    Resting Oxygen Saturation   98 %  100 %    Exercise Oxygen Saturation  during 6 min walk  94 %  95 %    Max Ex. HR  96 bpm  96 bpm    Max Ex. BP  140/68  162/68    2 Minute Post BP  108/64  112/64      Interval HR   1 Minute HR  96  80    2 Minute HR  96  95    3 Minute HR  95  95    4 Minute HR  94  95    5 Minute HR  95  95    6 Minute HR  95  96    2 Minute Post HR  85  88    Interval  Heart Rate?  Yes  Yes      Interval Oxygen   Interval Oxygen?  Yes  Yes    Baseline Oxygen Saturation %  98 %  100 %    1 Minute Oxygen Saturation %  95 %  97 %    1 Minute Liters of Oxygen  0 L  0 L    2 Minute Oxygen Saturation %  94 %  96 %    2 Minute Liters of Oxygen  0 L  0 L    3 Minute Oxygen Saturation %  96 %  96 %    3 Minute Liters of Oxygen  0 L  0 L    4 Minute Oxygen Saturation %  94 %  96 %    4 Minute Liters of Oxygen  0 L  0 L    5 Minute Oxygen Saturation %  94 %  96 %    5 Minute Liters of Oxygen  0 L  0 L    6 Minute Oxygen Saturation %  95 %  95 %    6 Minute Liters of Oxygen  0 L  0 L    2 Minute Post Oxygen Saturation %  97 %  99 %    2 Minute Post Liters of Oxygen  0 L  0 L  Psychological, QOL, Others - Outcomes: PHQ 2/9: Depression screen [facility] 2/9 02/22/2019 03/13/2018 03/13/2018  Decreased Interest 0 0 0  Down, Depressed, Hopeless 0 0 0  PHQ - 2 Score 0 0 0  Altered sleeping 1 0 0  Tired, decreased energy 0 3 3  Change in appetite 0 3 3  Feeling bad or failure about yourself  0 0 0  Trouble concentrating 0 0 0  Moving slowly or fidgety/restless 0 0 0  Suicidal thoughts 0 0 0  PHQ-9 Score - 6 6  Difficult doing work/chores Not difficult at all Somewhat difficult Somewhat difficult    Quality of Life:   Personal Goals: Goals established at orientation with interventions provided to work toward goal. Personal Goals and Risk Factors at Admission - 02/22/19 1002      Core Components/Risk Factors/Patient Goals on Admission    Weight Management  Weight Gain    Improve shortness of breath with ADL's  Yes    Intervention  Provide education, individualized exercise plan and daily activity instruction to help decrease symptoms of SOB with activities of daily living.    Expected Outcomes  Short Term: Improve cardiorespiratory fitness to achieve a reduction of symptoms when performing ADLs;Long Term: Be able to perform more ADLs without symptoms or  delay the onset of symptoms        Personal Goals Discharge: Goals and Risk Factor Review    Row Name 02/22/19 1003 03/02/19 1151 05/03/19 1340 06/07/19 1310       Core Components/Risk Factors/Patient Goals Review   Personal Goals Review  Improve shortness of breath with ADL's;Increase knowledge of respiratory medications and ability to use respiratory devices properly.;Develop more efficient breathing techniques such as purse lipped breathing and diaphragmatic breathing and practicing self-pacing with activity.;Weight Management/Obesity weight gain needed  Weight Management/Obesity;Develop more efficient breathing techniques such as purse lipped breathing and diaphragmatic breathing and practicing self-pacing with activity.;Increase knowledge of respiratory medications and ability to use respiratory devices properly.;Improve shortness of breath with ADL's  Weight Management/Obesity;Develop more efficient breathing techniques such as purse lipped breathing and diaphragmatic breathing and practicing self-pacing with activity.;Increase knowledge of respiratory medications and ability to use respiratory devices properly.;Improve shortness of breath with ADL's  Develop more efficient breathing techniques such as purse lipped breathing and diaphragmatic breathing and practicing self-pacing with activity.;Increase knowledge of respiratory medications and ability to use respiratory devices properly.;Improve shortness of breath with ADL's    Review  --  Just started program today, is very deconditioned, unable to walk on the treadmill for a sustained 15 minutes, changed equipment to upright bike and nustep.  Also suggested he should eat breakfast before exercising.  Department closure for 7 weeks, patient will begin in person exercise in pulmonary rehab 05/11/2019.  Will begin working on shortness of breath and deconditioning.  Brett Saunders has completed 12 exercise sessions and is very slow to progress due to his COPD.   He is exercising on the [employer] bike at level 1.5 and it is hard for him, and level 3 on the nustep.  He continues to be underweight despite multiple discussions with our nutritionist.    Expected Outcomes  --  See admission goals.  See admission goals.  See admission goals.       Exercise Goals and Review: Exercise Goals    Row Name 02/22/19 1048 03/02/19 1147 05/04/19 0855 06/08/19 0709       Exercise Goals   Increase Physical Activity  Yes  Yes  Yes  Yes    Intervention  Provide advice, education, support and counseling about physical activity/exercise needs.;Develop an individualized exercise prescription for aerobic and resistive training based on initial evaluation findings, risk stratification, comorbidities and participant's personal goals.  Provide advice, education, support and counseling about physical activity/exercise needs.;Develop an individualized exercise prescription for aerobic and resistive training based on initial evaluation findings, risk stratification, comorbidities and participant's personal goals.  Provide advice, education, support and counseling about physical activity/exercise needs.;Develop an individualized exercise prescription for aerobic and resistive training based on initial evaluation findings, risk stratification, comorbidities and participant's personal goals.  Provide advice, education, support and counseling about physical activity/exercise needs.;Develop an individualized exercise prescription for aerobic and resistive training based on initial evaluation findings, risk stratification, comorbidities and participant's personal goals.    Expected Outcomes  Short Term: Attend rehab on a regular basis to increase amount of physical activity.;Long Term: Add in home exercise to make exercise part of routine and to increase amount of physical activity.;Long Term: Exercising regularly at least 3-5 days a week.  Short Term: Attend rehab on a regular basis to increase  amount of physical activity.;Long Term: Add in home exercise to make exercise part of routine and to increase amount of physical activity.;Long Term: Exercising regularly at least 3-5 days a week.  Short Term: Attend rehab on a regular basis to increase amount of physical activity.;Long Term: Add in home exercise to make exercise part of routine and to increase amount of physical activity.;Long Term: Exercising regularly at least 3-5 days a week.  Short Term: Attend rehab on a regular basis to increase amount of physical activity.;Long Term: Add in home exercise to make exercise part of routine and to increase amount of physical activity.;Long Term: Exercising regularly at least 3-5 days a week.    Increase Strength and Stamina  Yes  Yes  Yes  Yes    Intervention  Provide advice, education, support and counseling about physical activity/exercise needs.;Develop an individualized exercise prescription for aerobic and resistive training based on initial evaluation findings, risk stratification, comorbidities and participant's personal goals.  Provide advice, education, support and counseling about physical activity/exercise needs.;Develop an individualized exercise prescription for aerobic and resistive training based on initial evaluation findings, risk stratification, comorbidities and participant's personal goals.  Provide advice, education, support and counseling about physical activity/exercise needs.;Develop an individualized exercise prescription for aerobic and resistive training based on initial evaluation findings, risk stratification, comorbidities and participant's personal goals.  Provide advice, education, support and counseling about physical activity/exercise needs.;Develop an individualized exercise prescription for aerobic and resistive training based on initial evaluation findings, risk stratification, comorbidities and participant's personal goals.    Expected Outcomes  Short Term: Increase  workloads from initial exercise prescription for resistance, speed, and METs.;Short Term: Perform resistance training exercises routinely during rehab and add in resistance training at home;Long Term: Improve cardiorespiratory fitness, muscular endurance and strength as measured by increased METs and functional capacity (6MWT)  Short Term: Increase workloads from initial exercise prescription for resistance, speed, and METs.;Short Term: Perform resistance training exercises routinely during rehab and add in resistance training at home;Long Term: Improve cardiorespiratory fitness, muscular endurance and strength as measured by increased METs and functional capacity (6MWT)  Short Term: Increase workloads from initial exercise prescription for resistance, speed, and METs.;Short Term: Perform resistance training exercises routinely during rehab and add in resistance training at home;Long Term: Improve cardiorespiratory fitness, muscular endurance and strength as measured by increased METs and functional capacity (6MWT)  Short  Term: Increase workloads from initial exercise prescription for resistance, speed, and METs.;Short Term: Perform resistance training exercises routinely during rehab and add in resistance training at home;Long Term: Improve cardiorespiratory fitness, muscular endurance and strength as measured by increased METs and functional capacity (6MWT)    Able to understand and use rate of perceived exertion (RPE) scale  Yes  Yes  Yes  Yes    Intervention  Provide education and explanation on how to use RPE scale  Provide education and explanation on how to use RPE scale  Provide education and explanation on how to use RPE scale  Provide education and explanation on how to use RPE scale    Expected Outcomes  Short Term: Able to use RPE daily in rehab to express subjective intensity level;Long Term:  Able to use RPE to guide intensity level when exercising independently  Short Term: Able to use RPE daily in  rehab to express subjective intensity level;Long Term:  Able to use RPE to guide intensity level when exercising independently  Short Term: Able to use RPE daily in rehab to express subjective intensity level;Long Term:  Able to use RPE to guide intensity level when exercising independently  Short Term: Able to use RPE daily in rehab to express subjective intensity level;Long Term:  Able to use RPE to guide intensity level when exercising independently    Able to understand and use Dyspnea scale  Yes  Yes  Yes  Yes    Intervention  Provide education and explanation on how to use Dyspnea scale  Provide education and explanation on how to use Dyspnea scale  Provide education and explanation on how to use Dyspnea scale  Provide education and explanation on how to use Dyspnea scale    Expected Outcomes  Short Term: Able to use Dyspnea scale daily in rehab to express subjective sense of shortness of breath during exertion;Long Term: Able to use Dyspnea scale to guide intensity level when exercising independently  Short Term: Able to use Dyspnea scale daily in rehab to express subjective sense of shortness of breath during exertion;Long Term: Able to use Dyspnea scale to guide intensity level when exercising independently  Short Term: Able to use Dyspnea scale daily in rehab to express subjective sense of shortness of breath during exertion;Long Term: Able to use Dyspnea scale to guide intensity level when exercising independently  Short Term: Able to use Dyspnea scale daily in rehab to express subjective sense of shortness of breath during exertion;Long Term: Able to use Dyspnea scale to guide intensity level when exercising independently    Knowledge and understanding of Target Heart Rate Range (THRR)  Yes  Yes  Yes  Yes    Intervention  Provide education and explanation of THRR including how the numbers were predicted and where they are located for reference  Provide education and explanation of THRR including how  the numbers were predicted and where they are located for reference  Provide education and explanation of THRR including how the numbers were predicted and where they are located for reference  Provide education and explanation of THRR including how the numbers were predicted and where they are located for reference    Expected Outcomes  Short Term: Able to state/look up THRR;Long Term: Able to use THRR to govern intensity when exercising independently;Short Term: Able to use daily as guideline for intensity in rehab  Short Term: Able to state/look up THRR;Long Term: Able to use THRR to govern intensity when exercising independently;Short Term: Able  to use daily as guideline for intensity in rehab  Short Term: Able to state/look up THRR;Long Term: Able to use THRR to govern intensity when exercising independently;Short Term: Able to use daily as guideline for intensity in rehab  Short Term: Able to state/look up THRR;Long Term: Able to use THRR to govern intensity when exercising independently;Short Term: Able to use daily as guideline for intensity in rehab    Understanding of Exercise Prescription  Yes  Yes  Yes  Yes    Intervention  Provide education, explanation, and written materials on patient's individual exercise prescription  Provide education, explanation, and written materials on patient's individual exercise prescription  Provide education, explanation, and written materials on patient's individual exercise prescription  Provide education, explanation, and written materials on patient's individual exercise prescription    Expected Outcomes  Short Term: Able to explain program exercise prescription;Long Term: Able to explain home exercise prescription to exercise independently  Short Term: Able to explain program exercise prescription;Long Term: Able to explain home exercise prescription to exercise independently  Short Term: Able to explain program exercise prescription;Long Term: Able to explain  home exercise prescription to exercise independently  Short Term: Able to explain program exercise prescription;Long Term: Able to explain home exercise prescription to exercise independently    Improve claudication pain toleration; Improve walking ability  Yes  Yes  Yes  Yes    Intervention  Participate in PAD/SET Rehab 2-3 days a week, walking at home as part of exercise prescription;Attend education sessions to aid in risk factor modification and understanding of disease process  Participate in PAD/SET Rehab 2-3 days a week, walking at home as part of exercise prescription;Attend education sessions to aid in risk factor modification and understanding of disease process  Participate in PAD/SET Rehab 2-3 days a week, walking at home as part of exercise prescription;Attend education sessions to aid in risk factor modification and understanding of disease process  Participate in PAD/SET Rehab 2-3 days a week, walking at home as part of exercise prescription;Attend education sessions to aid in risk factor modification and understanding of disease process    Expected Outcomes  Short Term: Improve walking distance/time to onset of claudication pain;Long Term: Improve walking ability and toleration to claudication;Long Term: Improve score of PAD questionnaires  Short Term: Improve walking distance/time to onset of claudication pain;Long Term: Improve walking ability and toleration to claudication;Long Term: Improve score of PAD questionnaires  Short Term: Improve walking distance/time to onset of claudication pain;Long Term: Improve walking ability and toleration to claudication;Long Term: Improve score of PAD questionnaires  Short Term: Improve walking distance/time to onset of claudication pain;Long Term: Improve walking ability and toleration to claudication;Long Term: Improve score of PAD questionnaires       Exercise Goals Re-Evaluation: Exercise Goals Re-Evaluation    Row Name 03/02/19 1148 05/04/19 0855  06/08/19 0651 06/08/19 0709       Exercise Goal Re-Evaluation   Exercise Goals Review  Increase Physical Activity;Able to understand and use rate of perceived exertion (RPE) scale;Increase Strength and Stamina;Able to understand and use Dyspnea scale;Knowledge and understanding of Target Heart Rate Range (THRR);Understanding of Exercise Prescription  Increase Physical Activity;Able to understand and use rate of perceived exertion (RPE) scale;Increase Strength and Stamina;Able to understand and use Dyspnea scale;Knowledge and understanding of Target Heart Rate Range (THRR);Understanding of Exercise Prescription  --  Increase Physical Activity;Able to understand and use rate of perceived exertion (RPE) scale;Increase Strength and Stamina;Able to understand and use Dyspnea scale;Knowledge and understanding  of Target Heart Rate Range (THRR);Understanding of Exercise Prescription    Comments  Pt completed first exercise session. Pt was unable to walk on the TM due to fatigue. Pt exercised at 2.1 METs on the stepper. Will continue to monitor and progress as able.  Pt has been riding a stationary bike at home since our cessation of in person exercise. Pt will return to exercise 2/23. Will continue to monitor and progress.  --  Pt has attended 12 exercise sessions. Pt is not very welcoming of workload increases and I believe that pt could push himself harder than he does. Progress has been minimal. Pt currently exercises at 1.6 METs on the stepper. WIll continue to monitor and progress as able.    Expected Outcomes  Through exercise at rehab and at home, the patient will decrease shortness of breath with daily activities and feel confident in carrying out an exercise regime at home.   Through exercise at rehab and at home, the patient will decrease shortness of breath with daily activities and feel confident in carrying out an exercise regime at home.   Through exercise at rehab and at home, the patient will decrease  shortness of breath with daily activities and feel confident in carrying out an exercise regime at home.   Through exercise at rehab and at home, the patient will decrease shortness of breath with daily activities and feel confident in carrying out an exercise regime at home.        Nutrition & Weight - Outcomes: Pre Biometrics - 02/22/19 0948      Pre Biometrics   Height  5' 11"  (1.803 m)    Weight  53.1 kg    BMI (Calculated)  16.33    Grip Strength  24 kg        Nutrition: Nutrition Therapy & Goals - 03/04/19 1332      Nutrition Therapy   Diet  high protein, high calorie      Personal Nutrition Goals   Nutrition Goal  Pt to drink 3 ensures/day and add fats to meals and snacks    Personal Goal #2  Identify food quantities necessary to achieve wt gain of  -2# per week to a goal wt gain of 5-24 lb at graduation from pulmonary rehab.      Intervention Plan   Intervention  Prescribe, educate and counsel regarding individualized specific dietary modifications aiming towards targeted core components such as weight, hypertension, lipid management, diabetes, heart failure and other comorbidities.    Expected Outcomes  Long Term Goal: Adherence to prescribed nutrition plan.;Short Term Goal: A plan has been developed with personal nutrition goals set during dietitian appointment.       Nutrition Discharge: Nutrition Assessments - 03/04/19 1335      Rate Your Plate Scores   Pre Score  30       Education Questionnaire Score: Knowledge Questionnaire Score - 02/22/19 1002      Knowledge Questionnaire Score   Pre Score  16/18       Goals reviewed with patient; copy given to patient.

## 2019-07-15 NOTE — Addendum Note (Signed)
Encounter addended by: Lance Morin, RN on: 07/15/2019 9:16 AM  Actions taken: Clinical Note Signed, Episode resolved

## 2020-01-21 ENCOUNTER — Encounter: Payer: Self-pay | Admitting: Gastroenterology

## 2020-02-02 ENCOUNTER — Encounter: Payer: Self-pay | Admitting: Gastroenterology

## 2020-02-02 ENCOUNTER — Ambulatory Visit (INDEPENDENT_AMBULATORY_CARE_PROVIDER_SITE_OTHER): Payer: No Typology Code available for payment source | Admitting: Gastroenterology

## 2020-02-02 VITALS — BP 120/66 | HR 68 | Ht 68.0 in | Wt 129.5 lb

## 2020-02-02 DIAGNOSIS — R195 Other fecal abnormalities: Secondary | ICD-10-CM | POA: Diagnosis not present

## 2020-02-02 DIAGNOSIS — Z9981 Dependence on supplemental oxygen: Secondary | ICD-10-CM | POA: Diagnosis not present

## 2020-02-02 DIAGNOSIS — Z7901 Long term (current) use of anticoagulants: Secondary | ICD-10-CM

## 2020-02-02 NOTE — Progress Notes (Signed)
HPI :  74 year old male with a history of atrial fibrillation, history of stroke in 2013, history of DVT, on chronic Eliquis, history of COPD on 2 L of supplemental oxygen, referred by the [facility] for positive fit test.  Patient denies any blood in his stools.  He does notice red blood on the toilet paper at times which she attributes from hemorrhoids which has been longstanding.  He denies any constipation or diarrhea, his bowel habits have been regular.  He denies any abdominal pains.  He last reportedly had a colonoscopy in 2012 which showed diverticulosis and no polyps.  He generally has been feeling well.  He eats well, has occasional indigestion but not too bothersome.  He takes omeprazole as needed, does not really need to take this too much.  If he avoids spicy foods he does not have any problems.  Denies any cardiopulmonary symptoms.  He does have a history of lung cancer status post CyberKnife therapy, he states he is in remission or no active problems with that.  He is a bit frustrated by his (+) fit test, he was under the impression he did not warrant any further colonoscopy exams after the age 89 and is not sure if he wishes to proceed with colonoscopy.  Echocardiogram - 12/23/18 - EF 50-55%,   Colonoscopy 04/23/2010 - diverticulosis, otherwise normal  Labs: 10/06/19 WBC 8.37, Hgb 13.4, Plt 181, MCV 93.4 AST 19, ALT 18, BUN 11, Cr 0.942     Past Medical History:  Diagnosis Date  . Aortic anomaly    aortic calcification  . Arrhythmia    a fib  . Atrial fibrillation ([place])   . CAD (coronary artery disease)   . CHF (congestive heart failure) ([place])   . Chronic obstructive lung disease ([place])   . COPD (chronic obstructive pulmonary disease) ([place])   . Diverticulosis 2010   colonoscopy 08-2008  . DVT (deep venous thrombosis) ([place])   . GERD (gastroesophageal reflux disease)   . Hemorrhoids   . History of atrial fibrillation   . Hypertension   . Ischemic cardiomyopathy   . Lung  cancer ([place])    lung  . On home oxygen therapy   . Peripheral vascular disease ([place])   . Stroke [facility])    oct 2012     Past Surgical History:  Procedure Laterality Date  . PACEMAKER PLACEMENT     and defibrillator   History reviewed. No pertinent family history. Social History   Tobacco Use  . Smoking status: Former Smoker    Packs/day: 0.10    Types: Cigarettes    Quit date: 12/2011    Years since quitting: 8.1  . Smokeless tobacco: Never Used  . Tobacco comment: pt smokes 1 cigarrete occasionally. 1 pack "last a few months"  Vaping Use  . Vaping Use: Every day  . Start date: 04/14/2011  . Substances: Nicotine  Substance Use Topics  . Alcohol use: Yes    Comment: occasional beer  . Drug use: Never   Current Outpatient Medications  Medication Sig Dispense Refill  . acetaminophen (TYLENOL) 500 MG tablet Take 500 mg by mouth 2 (two) times daily.    Marland Kitchen albuterol (PROVENTIL) (2.5 MG/3ML) 0.083% nebulizer solution Take 2.5 mg by nebulization every 6 (six) hours as needed for wheezing or shortness of breath.    Marland Kitchen albuterol (VENTOLIN HFA) 108 (90 Base) MCG/ACT inhaler Inhale into the lungs as needed for wheezing or shortness of breath.    Marland Kitchen amiodarone (PACERONE) 200  MG tablet Take 2 tablets (400 mg total) by mouth 2 (two) times daily. 2 tab twice daily for 2 weeks, 1 tablet twice daily for two weeks then one tablet daily (Patient taking differently: Take 200 mg by mouth 2 (two) times daily. 2 tab twice daily for 2 weeks, 1 tablet twice daily for two weeks then one tablet daily) 90 tablet 1  . apixaban (ELIQUIS) 5 MG TABS tablet Take 2.5 mg by mouth 2 (two) times daily.    . diphenhydrAMINE (BENADRYL) 25 MG tablet Take 25 mg by mouth at bedtime.    . furosemide (LASIX) 20 MG tablet Take 20 mg by mouth daily.     . metoprolol succinate (TOPROL-XL) 25 MG 24 hr tablet Take 12.5 mg by mouth daily.     . Mometasone Furoate 200 MCG/ACT AERO Inhale 2 puffs into the lungs at bedtime.    .  OLODATEROL HCL IN Inhale 2.5 mg into the lungs every morning. 2 puffs    . OXYGEN Inhale 2 L/min into the lungs continuous.    . pravastatin (PRAVACHOL) 40 MG tablet Take 40 mg by mouth at bedtime.    . sodium chloride 1 g tablet Take 1 g by mouth 3 (three) times daily.     No current facility-administered medications for this visit.   Allergies  Allergen Reactions  . Amoxicillin Shortness Of Breath    Has patient had a PCN reaction causing immediate rash, facial/tongue/throat swelling, SOB or lightheadedness with hypotension: Yes Has patient had a PCN reaction causing severe rash involving mucus membranes or skin necrosis: Yes Has patient had a PCN reaction that required hospitalization: Yes Has patient had a PCN reaction occurring within the last 10 years: No If all of the above answers are "NO", then may proceed with Cephalosporin use.  . Avelox [Moxifloxacin Hcl] Other (See Comments)    confusion     Review of Systems: All systems reviewed and negative except where noted in HPI.   Labs per HPI  Physical Exam: BP 120/66 (BP Location: Left Arm, Patient Position: Sitting, Cuff Size: Normal)   Pulse 68   Ht 5\' 8"  (1.727 m) Comment: height measured without shoes  Wt 129 lb 8 oz (58.7 kg)   BMI 19.69 kg/m  Constitutional: Pleasant,well-developed, male in no acute distress. HEENT: Normocephalic and atraumatic. Conjunctivae are normal. No scleral icterus. Neck supple.  Cardiovascular: Normal rate, regular rhythm.  Pulmonary/chest: Effort normal and breath sounds normal, some coarse BS B Abdominal: Soft, nondistended, nontender.   Extremities: no edema Lymphadenopathy: No cervical adenopathy noted. Neurological: Alert and oriented to person place and time. Skin: Skin is warm and dry. No rashes noted. Psychiatric: Normal mood and affect. Behavior is normal.   ASSESSMENT AND PLAN: 74 year old male here for new patient assessment the following:  Positive fit test /  Anticoagulated / Oxygen dependant - as above, patient with a positive fit test in the setting of anticoagulation.  He had a negative colonoscopy in 2012.  I discussed what a fit test is with him, and differential for what is making this positive to include polyps in the colon and colon cancer, while false positive in the setting of anticoagulation also possible.  Nonetheless, colonoscopy is recommended to further evaluate this given he is not had a colonoscopy in several years.  I discussed risk and benefits of colonoscopy and anesthesia with him.  He does have COPD with oxygen dependence, given this, his case would need to be done at the hospital  with anesthesia support.  Given backlog of cases at the hospital, anticipate this may not be able to be done for the next 1 to 2 months.  He is okay with that.  In fact he is not sure if he wants to proceed with a colonoscopy at all, he voiced frustration about the fact that the fit test was done in general, he had thought he was done with colon cancer screening in general.  He wants to think about this and I told him we will call him in the next 1 to 2 months to discuss scheduling he can clarify if he is wishes to proceed or not at that time.  I counseled him that the risks of declining colonoscopy is developing colon cancer or failing to diagnose colon cancer if he already has it, which would lead to likely worse prognosis.  Of note, if he does wish to proceed with colonoscopy he will need to hold Eliquis for 2 days prior to the exam given bleeding risk.  He understands this. He verbalized understanding of my recommendations.  Palmer Cellar, MD [facility]  CC: Lin Landsman, MD

## 2020-02-02 NOTE — Patient Instructions (Addendum)
If you are age 76 or older, your body mass index should be between 23-30. Your Body mass index is 19.69 kg/m. If this is out of the aforementioned range listed, please consider follow up with your Primary Care Provider.  If you are age 24 or younger, your body mass index should be between 19-25. Your Body mass index is 19.69 kg/m. If this is out of the aformentioned range listed, please consider follow up with your Primary Care Provider.   We will add you to the wait list to have a colonoscopy at the hospital for + FIT test. We will need to get clearance.

## 2020-03-06 ENCOUNTER — Telehealth: Payer: Self-pay

## 2020-03-06 NOTE — Telephone Encounter (Signed)
Pt had Cardiology appt on 02-07-20 at the [place]. ECHO in 03-2018 with EF of 50-55%.  Filbert Berthold. Cardiology PA-C stated that pt is "cleared to hold apixaban for 2 to 3 days prior to planned procedure".

## 2020-03-20 ENCOUNTER — Telehealth: Payer: Self-pay | Admitting: Gastroenterology

## 2020-03-20 NOTE — Telephone Encounter (Signed)
Records from recent cardiology appointment at the [facility] reviewed (02/07/20) Patient cleared to proceed with colonoscopy. EF 50% Cleared to hold Eliquis for 2-3 days pre-procedure.  Jan can you please touch base with this patient and see if he wishes to proceed with colonoscopy or not. It is recommended in light of (+) FIT. We can schedule in Feb or can discuss alternative days I may need to open in the interim. Thanks

## 2020-03-22 NOTE — Telephone Encounter (Signed)
Called and left detailed message for pt regarding have colonoscopy at [facility] on Tuesday, 2-15 with Dr. Havery Moros (+ FIT test). He would need to be covid tested on 2-10,Thursday and have a PV to be instructed. Note: Pt is on Eliquis and has been cleared to hold for 2 to 3 days by the [place].  He is on O2.

## 2020-03-23 ENCOUNTER — Other Ambulatory Visit: Payer: Self-pay

## 2020-03-23 DIAGNOSIS — Z9981 Dependence on supplemental oxygen: Secondary | ICD-10-CM

## 2020-03-23 DIAGNOSIS — Z7901 Long term (current) use of anticoagulants: Secondary | ICD-10-CM

## 2020-03-23 DIAGNOSIS — R195 Other fecal abnormalities: Secondary | ICD-10-CM

## 2020-03-23 NOTE — Telephone Encounter (Signed)
Let message for pt to call back to discuss colon at [facility] on 2-15

## 2020-03-23 NOTE — Telephone Encounter (Signed)
Scheduled pt for colonoscopy at [facility] on 05-02-20.  PV scheduled for 1-19 at 4:00pm and Covid test on Friday, 2-11 at 1:00pm.

## 2020-03-24 ENCOUNTER — Telehealth: Payer: Self-pay | Admitting: Gastroenterology

## 2020-03-24 NOTE — Telephone Encounter (Signed)
Called and spoke to pt. He has decided to "pass" on having a procedure.  He indicated he has done a lot of reading and he feels it is just too much "worry".  He believes with his "comorbidities and his age it is not recommended" that he have a procedure.  He understands there are 2 sides to every story but he wants to pass on having a procedure. I let him know I will inform Dr. Havery Moros and cancel his procedure.

## 2020-03-24 NOTE — Telephone Encounter (Signed)
Called and spoke to pt. He has decided to "pass" on having a procedure.  He indicated he has done a lot of reading and he feels it is just too much "worry".  He believes with his "comorbidities and his age it is not recommended" that he have a procedure.  He understands there are 2 sides to every story but he wants to pass on having a procedure. I let him know I will inform Dr. Havery Moros and cancel his procedure

## 2020-03-24 NOTE — Telephone Encounter (Signed)
Patient called states he is fearful of having the procedure in addition to having to stop the Eliquis requesting to cancel

## 2020-04-28 ENCOUNTER — Other Ambulatory Visit (HOSPITAL_COMMUNITY): Payer: Non-veteran care

## 2020-05-02 ENCOUNTER — Ambulatory Visit (HOSPITAL_COMMUNITY): Admit: 2020-05-02 | Payer: Non-veteran care | Admitting: Gastroenterology

## 2020-05-02 ENCOUNTER — Encounter (HOSPITAL_COMMUNITY): Payer: Self-pay

## 2020-05-02 SURGERY — COLONOSCOPY WITH PROPOFOL
Anesthesia: Monitor Anesthesia Care

## 2020-05-18 ENCOUNTER — Telehealth (HOSPITAL_COMMUNITY): Payer: Self-pay | Admitting: *Deleted

## 2020-05-18 NOTE — Telephone Encounter (Signed)
Received VA authorization for this pt to participate in pulmonary rehab again.  Called and left message for Otelia Limes community care coordinator at the [facility] regarding sending pulmonary clinic notes and advising where we are with scheduling.  Priority will be given to pt who have not had an opportunity to participate in pulmonary rehab. Cherre Huger, BSN Cardiac and Training and development officer

## 2020-05-22 ENCOUNTER — Encounter (HOSPITAL_COMMUNITY): Payer: Self-pay | Admitting: *Deleted

## 2020-05-22 NOTE — Progress Notes (Signed)
Received requested documents from the Rafael Capo in the [facility] coordinator at [place]. Received Authorization [id number] from Dr. Gwenette Greet for this pt to participate in pulmonary rehab with the the diagnosis of severe COPD with Respiratory Failure.Pt is known to pulmonary rehab staff from previous participation in Pulmonary Rehab.  Pt graduated with the completion of 16 sessions. Clinical review of pt follow up appt on 2/28Pulmonary office note.  Pt with Covid Risk Score - 4. Dr. Gwenette Greet made aware that there is an extended wait list and priority will be by date of the referral and those patients who have not had an opportunity to participate in pulmonary or cardiac rehab.  Brett Saunders will be scheduled when able.  Cherre Huger, BSN Cardiac and Training and development officer

## 2020-06-08 ENCOUNTER — Telehealth (HOSPITAL_COMMUNITY): Payer: Self-pay

## 2020-06-08 NOTE — Telephone Encounter (Signed)
Called patient to see if he is interested in the Pulmonary Rehab Program. Patient expressed interest. Explained scheduling process and went over insurance, patient verbalized understanding. Will contact patient for scheduling once we start scheduling for May.

## 2020-08-01 ENCOUNTER — Telehealth (HOSPITAL_COMMUNITY): Payer: Self-pay

## 2020-08-01 NOTE — Telephone Encounter (Signed)
Patient called and was interested in participating in the Pulmonary Rehab Program. Patient will come in for orientation on 08/18/2020@9 :00am and will attend the 10:15am exercise class.  Tourist information centre manager.

## 2020-08-17 ENCOUNTER — Telehealth (HOSPITAL_COMMUNITY): Payer: Self-pay

## 2020-08-17 NOTE — Telephone Encounter (Signed)
Called pt to remind him of his appointment tomorrow 08/17/20 at 0900. LMTCB with any questions or if he cannot make it.

## 2020-08-18 ENCOUNTER — Other Ambulatory Visit: Payer: Self-pay

## 2020-08-18 ENCOUNTER — Encounter (HOSPITAL_COMMUNITY)
Admission: RE | Admit: 2020-08-18 | Discharge: 2020-08-18 | Disposition: A | Discharge status: Home / Self Care | Payer: No Typology Code available for payment source | Source: Ambulatory Visit | Attending: Cardiology | Admitting: Cardiology

## 2020-08-18 ENCOUNTER — Encounter (HOSPITAL_COMMUNITY): Payer: Self-pay

## 2020-08-18 VITALS — BP 110/50 | HR 82 | Ht 69.0 in | Wt 124.6 lb

## 2020-08-18 DIAGNOSIS — J449 Chronic obstructive pulmonary disease, unspecified: Secondary | ICD-10-CM | POA: Diagnosis present

## 2020-08-18 NOTE — Progress Notes (Signed)
Brett Saunders 75 y.o. male Pulmonary Rehab Orientation Note This patient who was referred to Pulmonary rehab by Dr. Gwenette Greet at the [facility] with the diagnosis of COPD arrived today in Cardiac and Pulmonary Rehab. He arrived ambulatory with steady gait. He does carry portable oxygen. [employer] is the provider for their DME. Per pt, he uses oxygen with exertion. Color good, skin warm and dry. Patient is oriented to time and place. Patient's medical history, psychosocial health, and medications reviewed. Psychosocial assessment reveals pt lives with their spouse. Pt is currently retired. Pt hobbies include spending time with his dog. Pt reports his stress level is low. Areas of stress/anxiety include Health.  PHQ2/9 score 0/4. Pt shows good coping skills with positive outlook. [place] was offered emotional support and reassurance. Will continue to monitor and evaluate progress toward psychosocial goal(s) of continued mental well being and improved quality of life. Patient reports he does take medications as prescribed. Patient states he follows a Regular diet. The patient reports no specific efforts to gain or lose weight. Patient's weight will be monitored closely. Demonstration and practice of PLB using pulse oximeter. Patient able to return demonstration satisfactorily. Safety and hand hygiene in the exercise area reviewed with patient. Patient voices understanding of the information reviewed. Department expectations discussed with patient and achievable goals were set. The patient shows enthusiasm about attending the program and we look forward to working with this nice gentleman. The patient completed a 6 min walk test today and is scheduled to begin exercise on 08/22/20. 45 minutes was spent on a variety of activities such as assessment of the patient, obtaining baseline data including height, weight, BMI, and grip strength, verifying medical history, allergies, and current medications, and teaching patient strategies  for performing tasks with less respiratory effort with emphasis on pursed lip breathing.  Rick Duff MS, ACSM CEP

## 2020-08-18 NOTE — Progress Notes (Signed)
Pulmonary Rehab Orientation Physical Assessment Note  Physical assessment reveals  Pt is alert and oriented x 3.  Heart rate is normal, breath sounds diminished throughout. Reports productive at times and occasionally non-productive cough. Bowel sounds present x 4 quadrant.  Pt denies abdominal discomfort, nausea, vomiting or diarrhea. Grip strength equal, strong. Distal pulses palpable; denies any swelling to lower extremities. Pt is looking forward to participating in Pulmonary rehab again. Cherre Huger, BSN Cardiac and Training and development officer

## 2020-08-18 NOTE — Progress Notes (Signed)
Pulmonary Individual Treatment Plan  Patient Details  Name: Brett Saunders MRN: [id number] Date of Birth: 05-25-1945 Referring Provider:   April Manson Pulmonary Rehab Walk Test from 08/18/2020 in [place]  Referring Provider Dr. Gwenette Greet ([place])      Initial Encounter Date:  Flowsheet Row Pulmonary Rehab Walk Test from 08/18/2020 in [place]  Date 08/18/20      Visit Diagnosis: Chronic obstructive pulmonary disease, unspecified COPD type ([place])  Patient's Home Medications on Admission:   Current Outpatient Medications:  .  albuterol (PROVENTIL) (2.5 MG/3ML) 0.083% nebulizer solution, Take 2.5 mg by nebulization every 6 (six) hours as needed for wheezing or shortness of breath., Disp: , Rfl:  .  albuterol (VENTOLIN HFA) 108 (90 Base) MCG/ACT inhaler, Inhale into the lungs as needed for wheezing or shortness of breath., Disp: , Rfl:  .  amiodarone (PACERONE) 200 MG tablet, Take 2 tablets (400 mg total) by mouth 2 (two) times daily. 2 tab twice daily for 2 weeks, 1 tablet twice daily for two weeks then one tablet daily (Patient taking differently: Take 200 mg by mouth 2 (two) times daily. 2 tab twice daily for 2 weeks, 1 tablet twice daily for two weeks then one tablet daily), Disp: 90 tablet, Rfl: 1 .  apixaban (ELIQUIS) 5 MG TABS tablet, Take 2.5 mg by mouth 2 (two) times daily., Disp: , Rfl:  .  furosemide (LASIX) 20 MG tablet, Take 20 mg by mouth daily. , Disp: , Rfl:  .  metoprolol succinate (TOPROL-XL) 25 MG 24 hr tablet, Take 12.5 mg by mouth daily. , Disp: , Rfl:  .  Mometasone Furoate 200 MCG/ACT AERO, Inhale 2 puffs into the lungs at bedtime., Disp: , Rfl:  .  OLODATEROL HCL IN, Inhale 2.5 mg into the lungs every morning. 2 puffs, Disp: , Rfl:  .  omeprazole (PRILOSEC) 20 MG capsule, Take 20 mg by mouth daily before breakfast., Disp: , Rfl:  .  OXYGEN, Inhale 2 L/min into the lungs continuous., Disp: , Rfl:  .   pravastatin (PRAVACHOL) 40 MG tablet, Take 40 mg by mouth at bedtime., Disp: , Rfl:  .  sodium chloride 1 g tablet, Take 1 g by mouth 3 (three) times daily., Disp: , Rfl:  .  acetaminophen (TYLENOL) 500 MG tablet, Take 500 mg by mouth 2 (two) times daily. (Patient not taking: Reported on 08/18/2020), Disp: , Rfl:  .  diphenhydrAMINE (BENADRYL) 25 MG tablet, Take 25 mg by mouth at bedtime. (Patient not taking: Reported on 08/18/2020), Disp: , Rfl:   Past Medical History: Past Medical History:  Diagnosis Date  . Aortic anomaly    aortic calcification  . Arrhythmia    a fib  . Atrial fibrillation ([place])   . CAD (coronary artery disease)   . CHF (congestive heart failure) ([place])   . Chronic obstructive lung disease ([place])   . COPD (chronic obstructive pulmonary disease) ([place])   . Diverticulosis 2010   colonoscopy 08-2008  . DVT (deep venous thrombosis) ([place])   . GERD (gastroesophageal reflux disease)   . Hemorrhoids   . History of atrial fibrillation   . Hypertension   . Ischemic cardiomyopathy   . Lung cancer ([place])    lung  . On home oxygen therapy   . Peripheral vascular disease ([place])   . Stroke [facility])    oct 2012    Tobacco Use: Social History   Tobacco Use  Smoking Status Former Smoker  .  Packs/day: 0.10  . Types: Cigarettes  . Quit date: 12/2011  . Years since quitting: 8.6  Smokeless Tobacco Never Used  Tobacco Comment   pt smokes 1 cigarrete occasionally. 1 pack "last a few months"    Labs: Recent Review Flowsheet Data   There is no flowsheet data to display.     Capillary Blood Glucose: No results found for: GLUCAP   Pulmonary Assessment Scores:  Pulmonary Assessment Scores    Row Name 08/18/20 1208         ADL UCSD   ADL Phase Exit     SOB Score total 35           CAT Score   CAT Score 20           mMRC Score   mMRC Score 3           UCSD: Self-administered rating of dyspnea associated with activities of daily living (ADLs) 6-point scale (0  = "not at all" to 5 = "maximal or unable to do because of breathlessness")  Scoring Scores range from 0 to 120.  Minimally important difference is 5 units  CAT: CAT can identify the health impairment of COPD patients and is better correlated with disease progression.  CAT has a scoring range of zero to 40. The CAT score is classified into four groups of low (less than 10), medium (10 - 20), high (21-30) and very high (31-40) based on the impact level of disease on health status. A CAT score over 10 suggests significant symptoms.  A worsening CAT score could be explained by an exacerbation, poor medication adherence, poor inhaler technique, or progression of COPD or comorbid conditions.  CAT MCID is 2 points  mMRC: mMRC (Modified Medical Research Council) Dyspnea Scale is used to assess the degree of baseline functional disability in patients of respiratory disease due to dyspnea. No minimal important difference is established. A decrease in score of 1 point or greater is considered a positive change.   Pulmonary Function Assessment:  Pulmonary Function Assessment - 08/18/20 0932      Breath   Shortness of Breath Yes;Limiting activity;Panic with Shortness of Breath           Exercise Target Goals: Exercise Program Goal: Individual exercise prescription set using results from initial 6 min walk test and THRR while considering  patient's activity barriers and safety.   Exercise Prescription Goal: Initial exercise prescription builds to 30-45 minutes a day of aerobic activity, 2-3 days per week.  Home exercise guidelines will be given to patient during program as part of exercise prescription that the participant will acknowledge.  Activity Barriers & Risk Stratification:  Activity Barriers & Cardiac Risk Stratification - 08/18/20 0930      Activity Barriers & Cardiac Risk Stratification   Activity Barriers Shortness of Breath;Back Problems;Deconditioning;Muscular Weakness            6 Minute Walk:  6 Minute Walk    Row Name 08/18/20 1141         6 Minute Walk   Phase Initial     Distance 872 feet     Walk Time 6 minutes     # of Rest Breaks 1  1 seated rest break for 45 seconds due to leg fatigue and shortness of breath     MPH 1.65     METS 2.39     RPE 12     Perceived Dyspnea  2     VO2 Peak 8.38  Symptoms Yes (comment)     Comments leg weakness/fatigue     Resting HR 82 bpm     Resting BP 110/50     Resting Oxygen Saturation  100 %     Exercise Oxygen Saturation  during 6 min walk 95 %     Max Ex. HR 98 bpm     Max Ex. BP 130/60     2 Minute Post BP 120/60           Interval HR   1 Minute HR 97     2 Minute HR 95     3 Minute HR 96     4 Minute HR 97     5 Minute HR 97     6 Minute HR 95     2 Minute Post HR 98     Interval Heart Rate? Yes           Interval Oxygen   Interval Oxygen? Yes     Baseline Oxygen Saturation % 99 %     1 Minute Oxygen Saturation % 99 %     1 Minute Liters of Oxygen 2 L     2 Minute Oxygen Saturation % 98 %     2 Minute Liters of Oxygen 2 L     3 Minute Oxygen Saturation % 97 %     3 Minute Liters of Oxygen 2 L     4 Minute Oxygen Saturation % 97 %     4 Minute Liters of Oxygen 2 L     5 Minute Oxygen Saturation % 97 %     5 Minute Liters of Oxygen 2 L     6 Minute Oxygen Saturation % 95 %     6 Minute Liters of Oxygen 2 L     2 Minute Post Oxygen Saturation % 98 %     2 Minute Post Liters of Oxygen 2 L            Oxygen Initial Assessment:  Oxygen Initial Assessment - 08/18/20 0931      Home Oxygen   Home Oxygen Device Portable Concentrator;Home Concentrator    Sleep Oxygen Prescription Continuous    Liters per minute 2    Home Exercise Oxygen Prescription Pulsed    Liters per minute 2    Home Resting Oxygen Prescription None    Compliance with Home Oxygen Use Yes      Initial 6 min Walk   Oxygen Used Continuous    Liters per minute 2      Program Oxygen Prescription   Program  Oxygen Prescription Continuous    Liters per minute 2      Intervention   Short Term Goals To learn and exhibit compliance with exercise, home and travel O2 prescription;To learn and understand importance of maintaining oxygen saturations>88%;To learn and demonstrate proper use of respiratory medications;To learn and understand importance of monitoring SPO2 with pulse oximeter and demonstrate accurate use of the pulse oximeter.;To learn and demonstrate proper pursed lip breathing techniques or other breathing techniques.    Long  Term Goals Exhibits compliance with exercise, home and travel O2 prescription;Verbalizes importance of monitoring SPO2 with pulse oximeter and return demonstration;Maintenance of O2 saturations>88%;Exhibits proper breathing techniques, such as pursed lip breathing or other method taught during program session;Compliance with respiratory medication;Demonstrates proper use of MDI's           Oxygen Re-Evaluation:   Oxygen Discharge (Final Oxygen Re-Evaluation):   Initial  Exercise Prescription:  Initial Exercise Prescription - 08/18/20 1100      Date of Initial Exercise RX and Referring Provider   Date 08/18/20    Referring Provider Dr. Gwenette Greet ([place])    Expected Discharge Date 10/19/20      Oxygen   Oxygen Continuous    Liters 2      NuStep   Level 1    SPM 70    Minutes 15      Track   Minutes 15      Prescription Details   Frequency (times per week) 2    Duration Progress to 30 minutes of continuous aerobic without signs/symptoms of physical distress      Intensity   THRR 40-80% of Max Heartrate 58-115    Ratings of Perceived Exertion 11-13    Perceived Dyspnea 0-4      Progression   Progression Continue to progress workloads to maintain intensity without signs/symptoms of physical distress.      Resistance Training   Training Prescription Yes    Weight Orange bands    Reps 10-15           Perform Capillary Blood Glucose checks as  needed.  Exercise Prescription Changes:   Exercise Comments:   Exercise Goals and Review:  Exercise Goals    Row Name 08/18/20 1148             Exercise Goals   Increase Physical Activity Yes       Intervention Provide advice, education, support and counseling about physical activity/exercise needs.;Develop an individualized exercise prescription for aerobic and resistive training based on initial evaluation findings, risk stratification, comorbidities and participant's personal goals.       Expected Outcomes Short Term: Attend rehab on a regular basis to increase amount of physical activity.;Long Term: Add in home exercise to make exercise part of routine and to increase amount of physical activity.;Long Term: Exercising regularly at least 3-5 days a week.       Increase Strength and Stamina Yes       Intervention Provide advice, education, support and counseling about physical activity/exercise needs.;Develop an individualized exercise prescription for aerobic and resistive training based on initial evaluation findings, risk stratification, comorbidities and participant's personal goals.       Expected Outcomes Short Term: Increase workloads from initial exercise prescription for resistance, speed, and METs.;Short Term: Perform resistance training exercises routinely during rehab and add in resistance training at home;Long Term: Improve cardiorespiratory fitness, muscular endurance and strength as measured by increased METs and functional capacity (6MWT)       Able to understand and use rate of perceived exertion (RPE) scale Yes       Intervention Provide education and explanation on how to use RPE scale       Expected Outcomes Short Term: Able to use RPE daily in rehab to express subjective intensity level;Long Term:  Able to use RPE to guide intensity level when exercising independently       Able to understand and use Dyspnea scale Yes       Intervention Provide education and  explanation on how to use Dyspnea scale       Expected Outcomes Short Term: Able to use Dyspnea scale daily in rehab to express subjective sense of shortness of breath during exertion;Long Term: Able to use Dyspnea scale to guide intensity level when exercising independently       Knowledge and understanding of Target Heart Rate Range (THRR) Yes  Intervention Provide education and explanation of THRR including how the numbers were predicted and where they are located for reference       Expected Outcomes Short Term: Able to state/look up THRR;Long Term: Able to use THRR to govern intensity when exercising independently;Short Term: Able to use daily as guideline for intensity in rehab       Understanding of Exercise Prescription Yes       Intervention Provide education, explanation, and written materials on patient's individual exercise prescription       Expected Outcomes Short Term: Able to explain program exercise prescription;Long Term: Able to explain home exercise prescription to exercise independently              Exercise Goals Re-Evaluation :   Discharge Exercise Prescription (Final Exercise Prescription Changes):   Nutrition:  Target Goals: Understanding of nutrition guidelines, daily intake of sodium 1500mg , cholesterol 200mg , calories 30% from fat and 7% or less from saturated fats, daily to have 5 or more servings of fruits and vegetables.  Biometrics:  Pre Biometrics - 08/18/20 1149      Pre Biometrics   Grip Strength 29 kg            Nutrition Therapy Plan and Nutrition Goals:   Nutrition Assessments:  MEDIFICTS Score Key:  ?70 Need to make dietary changes   40-70 Heart Healthy Diet  ? 40 Therapeutic Level Cholesterol Diet   Picture Your Plate Scores:  <76 Unhealthy dietary pattern with much room for improvement.  41-50 Dietary pattern unlikely to meet recommendations for good health and room for improvement.  51-60 More healthful dietary  pattern, with some room for improvement.   >60 Healthy dietary pattern, although there may be some specific behaviors that could be improved.    Nutrition Goals Re-Evaluation:   Nutrition Goals Discharge (Final Nutrition Goals Re-Evaluation):   Psychosocial: Target Goals: Acknowledge presence or absence of significant depression and/or stress, maximize coping skills, provide positive support system. Participant is able to verbalize types and ability to use techniques and skills needed for reducing stress and depression.  Initial Review & Psychosocial Screening:  Initial Psych Review & Screening - 08/18/20 0933      Initial Review   Current issues with None Identified      Family Dynamics   Good Support System? Yes   Wife is supportive     Barriers   Psychosocial barriers to participate in program The patient should benefit from training in stress management and relaxation.      Screening Interventions   Interventions Encouraged to exercise    Expected Outcomes Long Term Goal: Stressors or current issues are controlled or eliminated.;Short Term goal: Identification and review with participant of any Quality of Life or Depression concerns found by scoring the questionnaire.;Long Term goal: The participant improves quality of Life and PHQ9 Scores as seen by post scores and/or verbalization of changes           Quality of Life Scores:  Scores of 19 and below usually indicate a poorer quality of life in these areas.  A difference of  2-3 points is a clinically meaningful difference.  A difference of 2-3 points in the total score of the Quality of Life Index has been associated with significant improvement in overall quality of life, self-image, physical symptoms, and general health in studies assessing change in quality of life.  PHQ-9: Recent Review Flowsheet Data    Depression screen [facility] 2/9 08/18/2020 02/22/2019 03/13/2018 03/13/2018  Decreased Interest 0 0 0 0   Down,  Depressed, Hopeless 0 0 0 0   PHQ - 2 Score 0 0 0 0   Altered sleeping 0 1 0 0   Tired, decreased energy 2 0 3 3   Change in appetite 2  0 3 3   Feeling bad or failure about yourself  0 0 0 0   Trouble concentrating 0 0 0 0   Moving slowly or fidgety/restless 0 0 0 0   Suicidal thoughts 0 0 0 0   PHQ-9 Score - - 6 6   Difficult doing work/chores Not difficult at all Not difficult at all Somewhat difficult Somewhat difficult     Interpretation of Total Score  Total Score Depression Severity:  1-4 = Minimal depression, 5-9 = Mild depression, 10-14 = Moderate depression, 15-19 = Moderately severe depression, 20-27 = Severe depression   Psychosocial Evaluation and Intervention:  Psychosocial Evaluation - 08/18/20 1150      Psychosocial Evaluation & Interventions   Interventions Encouraged to exercise with the program and follow exercise prescription    Comments Pt has a positive outlook on life and does not show any psychosocial barriers.    Expected Outcomes Pt will continue to have posititve outlook and continue to have healthy coping mechanisms for stressors    Continue Psychosocial Services  No Follow up required           Psychosocial Re-Evaluation:   Psychosocial Discharge (Final Psychosocial Re-Evaluation):   Education: Education Goals: Education classes will be provided on a weekly basis, covering required topics. Participant will state understanding/return demonstration of topics presented.  Learning Barriers/Preferences:  Learning Barriers/Preferences - 08/18/20 0935      Learning Barriers/Preferences   Learning Barriers None    Learning Preferences Written Material;Computer/Internet;Video           Education Topics: Risk Factor Reduction:  -Group instruction that is supported by a PowerPoint presentation. Instructor discusses the definition of a risk factor, different risk factors for pulmonary disease, and how the heart and lungs work together.      Nutrition for Pulmonary Patient:  -Group instruction provided by PowerPoint slides, verbal discussion, and written materials to support subject matter. The instructor gives an explanation and review of healthy diet recommendations, which includes a discussion on weight management, recommendations for fruit and vegetable consumption, as well as protein, fluid, caffeine, fiber, sodium, sugar, and alcohol. Tips for eating when patients are short of breath are discussed. Flowsheet Row PULMONARY REHAB CHRONIC OBSTRUCTIVE PULMONARY DISEASE from 03/09/2019 in [place]  Date 03/09/19  Educator --  [[place]]      Pursed Lip Breathing:  -Group instruction that is supported by demonstration and informational handouts. Instructor discusses the benefits of pursed lip and diaphragmatic breathing and detailed demonstration on how to preform both.     Oxygen Safety:  -Group instruction provided by PowerPoint, verbal discussion, and written material to support subject matter. There is an overview of "What is Oxygen" and "Why do we need it".  Instructor also reviews how to create a safe environment for oxygen use, the importance of using oxygen as prescribed, and the risks of noncompliance. There is a brief discussion on traveling with oxygen and resources the patient may utilize. Flowsheet Row PULMONARY REHAB CHRONIC OBSTRUCTIVE PULMONARY DISEASE from 05/28/2018 in [place]  Date 04/23/18  Educator Cloyde Reams  Instruction Review Code 1- Verbalizes Understanding      Oxygen Equipment:  -  Group instruction provided by [facility] Staff utilizing handouts, written materials, and equipment demonstrations.   Signs and Symptoms:  -Group instruction provided by written material and verbal discussion to support subject matter. Warning signs and symptoms of infection, stroke, and heart attack are reviewed and when to call the physician/911  reinforced. Tips for preventing the spread of infection discussed. Flowsheet Row PULMONARY REHAB CHRONIC OBSTRUCTIVE PULMONARY DISEASE from 05/28/2018 in [place]  Date 05/14/18  Educator Remo Lipps  Instruction Review Code 1- Verbalizes Understanding      Advanced Directives:  -Group instruction provided by verbal instruction and written material to support subject matter. Instructor reviews Advanced Directive laws and proper instruction for filling out document.   Pulmonary Video:  -Group video education that reviews the importance of medication and oxygen compliance, exercise, good nutrition, pulmonary hygiene, and pursed lip and diaphragmatic breathing for the pulmonary patient.   Exercise for the Pulmonary Patient:  -Group instruction that is supported by a PowerPoint presentation. Instructor discusses benefits of exercise, core components of exercise, frequency, duration, and intensity of an exercise routine, importance of utilizing pulse oximetry during exercise, safety while exercising, and options of places to exercise outside of rehab.   Flowsheet Row PULMONARY REHAB CHRONIC OBSTRUCTIVE PULMONARY DISEASE from 05/28/2018 in [place]  Date 05/07/18  Educator Kirk Ruths  Instruction Review Code 1- Verbalizes Understanding      Pulmonary Medications:  -Verbally interactive group education provided by instructor with focus on inhaled medications and proper administration. Flowsheet Row PULMONARY REHAB CHRONIC OBSTRUCTIVE PULMONARY DISEASE from 05/27/2019 in [place]  Date 05/27/19  Educator --  [Handout]      Anatomy and Physiology of the Respiratory System and Intimacy:  -Group instruction provided by PowerPoint, verbal discussion, and written material to support subject matter. Instructor reviews respiratory cycle and anatomical components of the respiratory system and their functions.  Instructor also reviews differences in obstructive and restrictive respiratory diseases with examples of each. Intimacy, Sex, and Sexuality differences are reviewed with a discussion on how relationships can change when diagnosed with pulmonary disease. Common sexual concerns are reviewed. Flowsheet Row PULMONARY REHAB CHRONIC OBSTRUCTIVE PULMONARY DISEASE from 05/28/2018 in [place]  Date 05/28/18  Educator rn  Instruction Review Code 1- Verbalizes Understanding      MD DAY -A group question and answer session with a medical doctor that allows participants to ask questions that relate to their pulmonary disease state.   OTHER EDUCATION -Group or individual verbal, written, or video instructions that support the educational goals of the pulmonary rehab program. [place] from 05/27/2019 in [place]  Date 05/20/19  Educator DF  Gwyndolyn Kaufman your numbers]  Instruction Review Code 2- Demonstrated Understanding      Holiday Eating Survival Tips:  -Group instruction provided by PowerPoint slides, verbal discussion, and written materials to support subject matter. The instructor gives patients tips, tricks, and techniques to help them not only survive but enjoy the holidays despite the onslaught of food that accompanies the holidays.   Knowledge Questionnaire Score:  Knowledge Questionnaire Score - 08/18/20 1204      Knowledge Questionnaire Score   Pre Score 14/18           Core Components/Risk Factors/Patient Goals at Admission:  Personal Goals and Risk Factors at Admission - 08/18/20 1152      Core Components/Risk Factors/Patient  Goals on Admission    Weight Management Weight Maintenance    Improve shortness of breath with ADL's Yes    Intervention Provide education, individualized exercise plan and daily activity instruction to help decrease symptoms of SOB with  activities of daily living.    Expected Outcomes Short Term: Improve cardiorespiratory fitness to achieve a reduction of symptoms when performing ADLs;Long Term: Be able to perform more ADLs without symptoms or delay the onset of symptoms           Core Components/Risk Factors/Patient Goals Review:    Core Components/Risk Factors/Patient Goals at Discharge (Final Review):    ITP Comments:   Comments:

## 2020-08-22 ENCOUNTER — Other Ambulatory Visit: Payer: Self-pay

## 2020-08-22 ENCOUNTER — Encounter (HOSPITAL_COMMUNITY)
Admission: RE | Admit: 2020-08-22 | Discharge: 2020-08-22 | Disposition: A | Discharge status: Home / Self Care | Payer: No Typology Code available for payment source | Source: Ambulatory Visit | Attending: Cardiology | Admitting: Cardiology

## 2020-08-22 VITALS — Wt 124.0 lb

## 2020-08-22 DIAGNOSIS — J449 Chronic obstructive pulmonary disease, unspecified: Secondary | ICD-10-CM

## 2020-08-22 NOTE — Progress Notes (Signed)
Daily Session Note  Patient Details  Name: Brett Saunders MRN: [id number] Date of Birth: 12/13/1945 Referring Provider:   April Manson Pulmonary Rehab Walk Test from 08/18/2020 in [place]  Referring Provider Dr. Gwenette Greet ([place])      Encounter Date: 08/22/2020  Check In:  Session Check In - 08/22/20 1145      Check-In   Supervising physician immediately available to respond to emergencies [facility] immediately available    Physician(s) Dr. Tana Coast    Location [facility]    Staff Present Leda Roys, MS, ACSM-CEP, Exercise Physiologist;Lisa Lizbeth Bark, MS, ACSM-CEP, CCRP, Exercise Physiologist;Other    Virtual Visit No    Medication changes reported     No    Fall or balance concerns reported    No    Tobacco Cessation No Change    Warm-up and Cool-down Performed as group-led instruction    Resistance Training Performed Yes    VAD Patient? No    PAD/SET Patient? No      Pain Assessment   Currently in Pain? No/denies    Multiple Pain Sites No           Capillary Blood Glucose: No results found for this or any previous visit (from the past 24 hour(s)).    Social History   Tobacco Use  Smoking Status Former Smoker  . Packs/day: 0.10  . Types: Cigarettes  . Quit date: 12/2011  . Years since quitting: 8.6  Smokeless Tobacco Never Used  Tobacco Comment   pt smokes 1 cigarrete occasionally. 1 pack "last a few months"    Goals Met:  Proper associated with RPD/PD & O2 Sat Exercise tolerated well No report of cardiac concerns or symptoms Strength training completed today  Goals Unmet:  Not Applicable  Comments: Service time is from 1015 to 1128 Patient completed first day of exercise and tolerated well with no concerns.    Dr. Fransico Him is Medical Director for Cardiac Rehab at [facility].

## 2020-08-24 ENCOUNTER — Encounter (HOSPITAL_COMMUNITY): Payer: No Typology Code available for payment source

## 2020-08-29 ENCOUNTER — Other Ambulatory Visit: Payer: Self-pay

## 2020-08-29 ENCOUNTER — Encounter (HOSPITAL_COMMUNITY)
Admission: RE | Admit: 2020-08-29 | Discharge: 2020-08-29 | Disposition: A | Discharge status: Home / Self Care | Payer: No Typology Code available for payment source | Source: Ambulatory Visit | Attending: Cardiology | Admitting: Cardiology

## 2020-08-29 VITALS — Wt 124.8 lb

## 2020-08-29 DIAGNOSIS — J449 Chronic obstructive pulmonary disease, unspecified: Secondary | ICD-10-CM

## 2020-08-29 NOTE — Progress Notes (Signed)
Daily Session Note  Patient Details  Name: Brett Saunders MRN: [id number] Date of Birth: [birth year] Referring Provider:   April Manson Pulmonary Rehab Walk Test from 08/18/2020 in [place]  Referring Provider Dr. Gwenette Greet ([place])       Encounter Date: 08/29/2020  Check In:  Session Check In - 08/29/20 1153       Check-In   Supervising physician immediately available to respond to emergencies [facility] immediately available    Physician(s) Dr. Lonny Prude    [facility]    Staff Present Rosebud Poles, RN, Isaac Laud, MS, ACSM-CEP, Exercise Physiologist;Other;Ledford Goodson Ysidro Evert, RN    Virtual Visit No    Medication changes reported     No    Fall or balance concerns reported    No    Tobacco Cessation No Change    Warm-up and Cool-down Performed as group-led instruction    Resistance Training Performed Yes    VAD Patient? No    PAD/SET Patient? No      Pain Assessment   Currently in Pain? No/denies    Pain Score 0-No pain    Multiple Pain Sites No             Capillary Blood Glucose: No results found for this or any previous visit (from the past 24 hour(s)).   Exercise Prescription Changes - 08/29/20 1200       Response to Exercise   Blood Pressure (Admit) 126/64    Blood Pressure (Exercise) 124/60    Blood Pressure (Exit) 120/64    Heart Rate (Admit) 78 bpm    Heart Rate (Exercise) 84 bpm    Heart Rate (Exit) 68 bpm    Oxygen Saturation (Admit) 99 %    Oxygen Saturation (Exercise) 100 %    Oxygen Saturation (Exit) 100 %    Rating of Perceived Exertion (Exercise) 12    Perceived Dyspnea (Exercise) 3    Duration Progress to 30 minutes of  aerobic without signs/symptoms of physical distress    Intensity Other (comment)   40-80% of HRR     Progression   Progression Continue to progress workloads to maintain intensity without signs/symptoms of physical distress.      Resistance Training   Training  Prescription Yes    Weight Orange bands    Reps 10-15      Oxygen   Oxygen Continuous    Liters 2      NuStep   Level 1    SPM 70    Minutes 30    METs 1.8             Social History   Tobacco Use  Smoking Status Former   Packs/day: 0.10   Pack years: 0.00   Types: Cigarettes   Quit date: 12/2011   Years since quitting: 8.7  Smokeless Tobacco Never  Tobacco Comments   pt smokes 1 cigarrete occasionally. 1 pack "last a few months"    Goals Met:  Exercise tolerated well No report of cardiac concerns or symptoms Strength training completed today  Goals Unmet:  Not Applicable  Comments: Service time is from 1017 to 1130     Dr. Fransico Him is Medical Director for Cardiac Rehab at [facility].

## 2020-08-29 NOTE — Progress Notes (Addendum)
Brett Saunders 75 y.o. male Nutrition Note  Diagnosis:COPD  Past Medical History:  Diagnosis Date   Aortic anomaly    aortic calcification   Arrhythmia    a fib   Atrial fibrillation (HCC)    CAD (coronary artery disease)    CHF (congestive heart failure) (HCC)    Chronic obstructive lung disease (HCC)    COPD (chronic obstructive pulmonary disease) ([place])    Diverticulosis 2010   colonoscopy 08-2008   DVT (deep venous thrombosis) (HCC)    GERD (gastroesophageal reflux disease)    Hemorrhoids    History of atrial fibrillation    Hypertension    Ischemic cardiomyopathy    Lung cancer ([place])    lung   On home oxygen therapy    Peripheral vascular disease ([place])    Stroke ([place])    oct 2012     Medications reviewed.   Current Outpatient Medications:    acetaminophen (TYLENOL) 500 MG tablet, Take 500 mg by mouth 2 (two) times daily. (Patient not taking: Reported on 08/18/2020), Disp: , Rfl:    albuterol (PROVENTIL) (2.5 MG/3ML) 0.083% nebulizer solution, Take 2.5 mg by nebulization every 6 (six) hours as needed for wheezing or shortness of breath., Disp: , Rfl:    albuterol (VENTOLIN HFA) 108 (90 Base) MCG/ACT inhaler, Inhale into the lungs as needed for wheezing or shortness of breath., Disp: , Rfl:    amiodarone (PACERONE) 200 MG tablet, Take 2 tablets (400 mg total) by mouth 2 (two) times daily. 2 tab twice daily for 2 weeks, 1 tablet twice daily for two weeks then one tablet daily (Patient taking differently: Take 200 mg by mouth 2 (two) times daily. 2 tab twice daily for 2 weeks, 1 tablet twice daily for two weeks then one tablet daily), Disp: 90 tablet, Rfl: 1   apixaban (ELIQUIS) 5 MG TABS tablet, Take 2.5 mg by mouth 2 (two) times daily., Disp: , Rfl:    diphenhydrAMINE (BENADRYL) 25 MG tablet, Take 25 mg by mouth at bedtime. (Patient not taking: Reported on 08/18/2020), Disp: , Rfl:    furosemide (LASIX) 20 MG tablet, Take 20 mg by mouth daily. , Disp: , Rfl:    metoprolol  succinate (TOPROL-XL) 25 MG 24 hr tablet, Take 12.5 mg by mouth daily. , Disp: , Rfl:    Mometasone Furoate 200 MCG/ACT AERO, Inhale 2 puffs into the lungs at bedtime., Disp: , Rfl:    OLODATEROL HCL IN, Inhale 2.5 mg into the lungs every morning. 2 puffs, Disp: , Rfl:    omeprazole (PRILOSEC) 20 MG capsule, Take 20 mg by mouth daily before breakfast., Disp: , Rfl:    OXYGEN, Inhale 2 L/min into the lungs continuous., Disp: , Rfl:    pravastatin (PRAVACHOL) 40 MG tablet, Take 40 mg by mouth at bedtime., Disp: , Rfl:    rOPINIRole (REQUIP) 0.25 MG tablet, Take 0.25 mg by mouth 2 (two) times daily., Disp: , Rfl:    sodium chloride 1 g tablet, Take 1 g by mouth 3 (three) times daily., Disp: , Rfl:    Ht Readings from Last 1 Encounters:  08/18/20 5\' 9"  (1.753 m)     Wt Readings from Last 3 Encounters:  08/29/20 124 lb 12.5 oz (56.6 kg)  08/29/20 124 lb (56.2 kg)  08/18/20 124 lb 9 oz (56.5 kg)     Body mass index is 18.43 kg/m.   Social History   Tobacco Use  Smoking Status Former   Packs/day: 0.10  Pack years: 0.00   Types: Cigarettes   Quit date: 12/2011   Years since quitting: 8.7  Smokeless Tobacco Never  Tobacco Comments   pt smokes 1 cigarrete occasionally. 1 pack "last a few months"      Nutrition Note  Spoke with pt. Nutrition Plan and Nutrition Survey goals reviewed with pt.  Pt does not eat a nutrient dense/generally healthful diet. He is interested in gaining weight. Noted BMI 18.43 kg/m2. He has good appetite but experiences early satiety. He gets busy and sometimes forgets to eat meals. He tries drinking 2-3 Ensures per day. He would lie to gain 5-10 lbs.  Provided pt with his calorie foods. Discussed eating 5-6 meals/snacks daily. He tends to have more energy in the morning. We discussed doing a larger lunch (he does not want to eat a larger breakfast). Reviewed exercise and nutrition as best way to gain weight. Pt amenable to continue working on exercise habits  as well as diet. Pt's wife cooks and grocery shops. Pt does not wear O2 while eating. He is going to monitor his oxygen saturation while eating   Pt expressed understanding of the information reviewed.    Nutrition Diagnosis Inadequate oral intake related to early satiety as evidenced by pt report of SOB after eating and early satiety after small amount of food intake, BMI 18.43 kg/m2.   Nutrition Intervention Pt's individual nutrition plan reviewed with pt. High calorie snacks and meals, Ensure 2-3 per day, 5-6 mini meals  Pt given handouts for: ? COPD MNT  Continue client-centered nutrition education by RD, as part of interdisciplinary care.  Goal(s) Pt to meet exercise guidelines of 30 minutes 3-5 days per week. Pt to identify food quantities necessary to achieve weight gain 0.5 lbs per week lb at graduation from pulmonary  rehab.  Pt to incorporate nutrient dense and high calorie foods into his 5-6 mini meals per day  Plan:  Will provide client-centered nutrition education as part of interdisciplinary care Monitor and evaluate progress toward nutrition goal with team.   Michaele Offer, MS, RDN, LDN

## 2020-08-31 ENCOUNTER — Other Ambulatory Visit: Payer: Self-pay

## 2020-08-31 ENCOUNTER — Encounter (HOSPITAL_COMMUNITY)
Admission: RE | Admit: 2020-08-31 | Discharge: 2020-08-31 | Disposition: A | Discharge status: Home / Self Care | Payer: No Typology Code available for payment source | Source: Ambulatory Visit | Attending: Cardiology | Admitting: Cardiology

## 2020-08-31 DIAGNOSIS — J449 Chronic obstructive pulmonary disease, unspecified: Secondary | ICD-10-CM

## 2020-08-31 NOTE — Progress Notes (Signed)
Daily Session Note  Patient Details  Name: Brett Saunders MRN: [id number] Date of Birth: 06/10/1945 Referring Provider:   April Manson Pulmonary Rehab Walk Test from 08/18/2020 in [place]  Referring Provider Dr. Gwenette Greet ([place])       Encounter Date: 08/31/2020  Check In:  Session Check In - 08/31/20 1114       Check-In   Supervising physician immediately available to respond to emergencies [facility] immediately available    Physician(s) Dr. Arbutus Ped    Location [facility]    Staff Present Rosebud Poles, RN, BSN;Akeila Lana Ysidro Evert, RN;Other;Carlette Wilber Oliphant, RN, BSN;Ramon Dredge, RN, MHA    Virtual Visit No    Medication changes reported     No    Fall or balance concerns reported    No    Tobacco Cessation No Change    Warm-up and Cool-down Performed as group-led instruction    Resistance Training Performed Yes    VAD Patient? No    PAD/SET Patient? No      Pain Assessment   Currently in Pain? No/denies    Multiple Pain Sites No             Capillary Blood Glucose: No results found for this or any previous visit (from the past 24 hour(s)).    Social History   Tobacco Use  Smoking Status Former   Packs/day: 0.10   Pack years: 0.00   Types: Cigarettes   Quit date: 12/2011   Years since quitting: 8.7  Smokeless Tobacco Never  Tobacco Comments   pt smokes 1 cigarrete occasionally. 1 pack "last a few months"    Goals Met:  Exercise tolerated well No report of cardiac concerns or symptoms Strength training completed today  Goals Unmet:  Not Applicable  Comments: Service time is from 1015 to 1130    Dr. Fransico Him is Medical Director for Cardiac Rehab at [facility]

## 2020-09-05 ENCOUNTER — Encounter (HOSPITAL_COMMUNITY)
Admission: RE | Admit: 2020-09-05 | Discharge: 2020-09-05 | Disposition: A | Discharge status: Home / Self Care | Payer: No Typology Code available for payment source | Source: Ambulatory Visit | Attending: Cardiology | Admitting: Cardiology

## 2020-09-05 ENCOUNTER — Other Ambulatory Visit: Payer: Self-pay

## 2020-09-05 DIAGNOSIS — J449 Chronic obstructive pulmonary disease, unspecified: Secondary | ICD-10-CM

## 2020-09-05 NOTE — Progress Notes (Signed)
Pulmonary Individual Treatment Plan  Patient Details  Name: Brett Saunders MRN: [id number] Date of Birth: 01-Dec-1945 Referring Provider:   April Manson Pulmonary Rehab Walk Test from 08/18/2020 in [facility] [facility]  Referring Provider Dr. Gwenette Greet ([place])       Initial Encounter Date:  Flowsheet Row Pulmonary Rehab Walk Test from 08/18/2020 in [facility]  Date 08/18/20       Visit Diagnosis: Chronic obstructive pulmonary disease, unspecified COPD type ([place])  Patient's Home Medications on Admission:   Current Outpatient Medications:    acetaminophen (TYLENOL) 500 MG tablet, Take 500 mg by mouth 2 (two) times daily. (Patient not taking: Reported on 08/18/2020), Disp: , Rfl:    albuterol (PROVENTIL) (2.5 MG/3ML) 0.083% nebulizer solution, Take 2.5 mg by nebulization every 6 (six) hours as needed for wheezing or shortness of breath., Disp: , Rfl:    albuterol (VENTOLIN HFA) 108 (90 Base) MCG/ACT inhaler, Inhale into the lungs as needed for wheezing or shortness of breath., Disp: , Rfl:    amiodarone (PACERONE) 200 MG tablet, Take 2 tablets (400 mg total) by mouth 2 (two) times daily. 2 tab twice daily for 2 weeks, 1 tablet twice daily for two weeks then one tablet daily (Patient taking differently: Take 200 mg by mouth 2 (two) times daily. 2 tab twice daily for 2 weeks, 1 tablet twice daily for two weeks then one tablet daily), Disp: 90 tablet, Rfl: 1   apixaban (ELIQUIS) 5 MG TABS tablet, Take 2.5 mg by mouth 2 (two) times daily., Disp: , Rfl:    diphenhydrAMINE (BENADRYL) 25 MG tablet, Take 25 mg by mouth at bedtime. (Patient not taking: Reported on 08/18/2020), Disp: , Rfl:    furosemide (LASIX) 20 MG tablet, Take 20 mg by mouth daily. , Disp: , Rfl:    metoprolol succinate (TOPROL-XL) 25 MG 24 hr tablet, Take 12.5 mg by mouth daily. , Disp: , Rfl:    Mometasone Furoate 200 MCG/ACT AERO, Inhale 2 puffs into the lungs at bedtime., Disp: , Rfl:     OLODATEROL HCL IN, Inhale 2.5 mg into the lungs every morning. 2 puffs, Disp: , Rfl:    omeprazole (PRILOSEC) 20 MG capsule, Take 20 mg by mouth daily before breakfast., Disp: , Rfl:    OXYGEN, Inhale 2 L/min into the lungs continuous., Disp: , Rfl:    pravastatin (PRAVACHOL) 40 MG tablet, Take 40 mg by mouth at bedtime., Disp: , Rfl:    rOPINIRole (REQUIP) 0.25 MG tablet, Take 0.25 mg by mouth 2 (two) times daily., Disp: , Rfl:    sodium chloride 1 g tablet, Take 1 g by mouth 3 (three) times daily., Disp: , Rfl:   Past Medical History: Past Medical History:  Diagnosis Date   Aortic anomaly    aortic calcification   Arrhythmia    a fib   Atrial fibrillation (HCC)    CAD (coronary artery disease)    CHF (congestive heart failure) (HCC)    Chronic obstructive lung disease (HCC)    COPD (chronic obstructive pulmonary disease) ([place])    Diverticulosis 2010   colonoscopy 08-2008   DVT (deep venous thrombosis) (HCC)    GERD (gastroesophageal reflux disease)    Hemorrhoids    History of atrial fibrillation    Hypertension    Ischemic cardiomyopathy    Lung cancer ([place])    lung   On home oxygen therapy    Peripheral vascular disease ([place])    Stroke (  [place])    oct 2012    Tobacco Use: Social History   Tobacco Use  Smoking Status Former   Packs/day: 0.10   Pack years: 0.00   Types: Cigarettes   Quit date: 12/2011   Years since quitting: 8.7  Smokeless Tobacco Never  Tobacco Comments   pt smokes 1 cigarrete occasionally. 1 pack "last a few months"    Labs: Recent Review Flowsheet Data   There is no flowsheet data to display.     Capillary Blood Glucose: No results found for: GLUCAP   Pulmonary Assessment Scores:  Pulmonary Assessment Scores     Row Name 08/18/20 1208         ADL UCSD   ADL Phase Exit     SOB Score total 35           CAT Score     CAT Score 20           mMRC Score     mMRC Score 3            UCSD: Self-administered rating of  dyspnea associated with activities of daily living (ADLs) 6-point scale (0 = "not at all" to 5 = "maximal or unable to do because of breathlessness")  Scoring Scores range from 0 to 120.  Minimally important difference is 5 units  CAT: CAT can identify the health impairment of COPD patients and is better correlated with disease progression.  CAT has a scoring range of zero to 40. The CAT score is classified into four groups of low (less than 10), medium (10 - 20), high (21-30) and very high (31-40) based on the impact level of disease on health status. A CAT score over 10 suggests significant symptoms.  A worsening CAT score could be explained by an exacerbation, poor medication adherence, poor inhaler technique, or progression of COPD or comorbid conditions.  CAT MCID is 2 points  mMRC: mMRC (Modified Medical Research Council) Dyspnea Scale is used to assess the degree of baseline functional disability in patients of respiratory disease due to dyspnea. No minimal important difference is established. A decrease in score of 1 point or greater is considered a positive change.   Pulmonary Function Assessment:  Pulmonary Function Assessment - 08/18/20 0932       Breath   Shortness of Breath Yes;Limiting activity;Panic with Shortness of Breath             Exercise Target Goals: Exercise Program Goal: Individual exercise prescription set using results from initial 6 min walk test and THRR while considering  patient's activity barriers and safety.   Exercise Prescription Goal: Initial exercise prescription builds to 30-45 minutes a day of aerobic activity, 2-3 days per week.  Home exercise guidelines will be given to patient during program as part of exercise prescription that the participant will acknowledge.  Activity Barriers & Risk Stratification:  Activity Barriers & Cardiac Risk Stratification - 08/18/20 0930       Activity Barriers & Cardiac Risk Stratification   Activity  Barriers Shortness of Breath;Back Problems;Deconditioning;Muscular Weakness             6 Minute Walk:  6 Minute Walk     Row Name 08/18/20 1141         6 Minute Walk   Phase Initial     Distance 872 feet     Walk Time 6 minutes     # of Rest Breaks 1  1 seated rest break for 45 seconds due  to leg fatigue and shortness of breath     MPH 1.65     METS 2.39     RPE 12     Perceived Dyspnea  2     VO2 Peak 8.38     Symptoms Yes (comment)     Comments leg weakness/fatigue     Resting HR 82 bpm     Resting BP 110/50     Resting Oxygen Saturation  100 %     Exercise Oxygen Saturation  during 6 min walk 95 %     Max Ex. HR 98 bpm     Max Ex. BP 130/60     2 Minute Post BP 120/60           Interval HR     1 Minute HR 97     2 Minute HR 95     3 Minute HR 96     4 Minute HR 97     5 Minute HR 97     6 Minute HR 95     2 Minute Post HR 98     Interval Heart Rate? Yes           Interval Oxygen     Interval Oxygen? Yes     Baseline Oxygen Saturation % 99 %     1 Minute Oxygen Saturation % 99 %     1 Minute Liters of Oxygen 2 L     2 Minute Oxygen Saturation % 98 %     2 Minute Liters of Oxygen 2 L     3 Minute Oxygen Saturation % 97 %     3 Minute Liters of Oxygen 2 L     4 Minute Oxygen Saturation % 97 %     4 Minute Liters of Oxygen 2 L     5 Minute Oxygen Saturation % 97 %     5 Minute Liters of Oxygen 2 L     6 Minute Oxygen Saturation % 95 %     6 Minute Liters of Oxygen 2 L     2 Minute Post Oxygen Saturation % 98 %     2 Minute Post Liters of Oxygen 2 L             Oxygen Initial Assessment:  Oxygen Initial Assessment - 08/18/20 0931       Home Oxygen   Home Oxygen Device Portable Concentrator;Home Concentrator    Sleep Oxygen Prescription Continuous    Liters per minute 2    Home Exercise Oxygen Prescription Pulsed    Liters per minute 2    Home Resting Oxygen Prescription None    Compliance with Home Oxygen Use Yes      Initial 6 min  Walk   Oxygen Used Continuous    Liters per minute 2      Program Oxygen Prescription   Program Oxygen Prescription Continuous    Liters per minute 2      Intervention   Short Term Goals To learn and exhibit compliance with exercise, home and travel O2 prescription;To learn and understand importance of maintaining oxygen saturations>88%;To learn and demonstrate proper use of respiratory medications;To learn and understand importance of monitoring SPO2 with pulse oximeter and demonstrate accurate use of the pulse oximeter.;To learn and demonstrate proper pursed lip breathing techniques or other breathing techniques.     Long  Term Goals Exhibits compliance with exercise, home  and travel O2 prescription;Verbalizes importance of monitoring SPO2 with  pulse oximeter and return demonstration;Maintenance of O2 saturations>88%;Exhibits proper breathing techniques, such as pursed lip breathing or other method taught during program session;Compliance with respiratory medication;Demonstrates proper use of MDI's             Oxygen Re-Evaluation:  Oxygen Re-Evaluation     Row Name 09/04/20 0954             Program Oxygen Prescription   Program Oxygen Prescription Continuous       Liters per minute 2               Home Oxygen     Home Oxygen Device Portable Concentrator;Home Concentrator       Sleep Oxygen Prescription Continuous       Liters per minute 2       Home Exercise Oxygen Prescription Pulsed       Liters per minute 2       Home Resting Oxygen Prescription None       Compliance with Home Oxygen Use Yes               Goals/Expected Outcomes     Short Term Goals To learn and exhibit compliance with exercise, home and travel O2 prescription;To learn and understand importance of maintaining oxygen saturations>88%;To learn and demonstrate proper use of respiratory medications;To learn and understand importance of monitoring SPO2 with pulse oximeter and demonstrate accurate use of  the pulse oximeter.;To learn and demonstrate proper pursed lip breathing techniques or other breathing techniques.        Long  Term Goals Exhibits compliance with exercise, home  and travel O2 prescription;Verbalizes importance of monitoring SPO2 with pulse oximeter and return demonstration;Maintenance of O2 saturations>88%;Exhibits proper breathing techniques, such as pursed lip breathing or other method taught during program session;Compliance with respiratory medication;Demonstrates proper use of MDI's       Goals/Expected Outcomes compliance and understanding of monitoring oxygen saturation and performing pursed lip breathing when needed.               Oxygen Discharge (Final Oxygen Re-Evaluation):  Oxygen Re-Evaluation - 09/04/20 0954       Program Oxygen Prescription   Program Oxygen Prescription Continuous    Liters per minute 2      Home Oxygen   Home Oxygen Device Portable Concentrator;Home Concentrator    Sleep Oxygen Prescription Continuous    Liters per minute 2    Home Exercise Oxygen Prescription Pulsed    Liters per minute 2    Home Resting Oxygen Prescription None    Compliance with Home Oxygen Use Yes      Goals/Expected Outcomes   Short Term Goals To learn and exhibit compliance with exercise, home and travel O2 prescription;To learn and understand importance of maintaining oxygen saturations>88%;To learn and demonstrate proper use of respiratory medications;To learn and understand importance of monitoring SPO2 with pulse oximeter and demonstrate accurate use of the pulse oximeter.;To learn and demonstrate proper pursed lip breathing techniques or other breathing techniques.     Long  Term Goals Exhibits compliance with exercise, home  and travel O2 prescription;Verbalizes importance of monitoring SPO2 with pulse oximeter and return demonstration;Maintenance of O2 saturations>88%;Exhibits proper breathing techniques, such as pursed lip breathing or other method taught  during program session;Compliance with respiratory medication;Demonstrates proper use of MDI's    Goals/Expected Outcomes compliance and understanding of monitoring oxygen saturation and performing pursed lip breathing when needed.             Initial  Exercise Prescription:  Initial Exercise Prescription - 08/18/20 1100       Date of Initial Exercise RX and Referring Provider   Date 08/18/20    Referring Provider Dr. Gwenette Greet ([place])    Expected Discharge Date 10/19/20      Oxygen   Oxygen Continuous    Liters 2      NuStep   Level 1    SPM 70    Minutes 15      Track   Minutes 15      Prescription Details   Frequency (times per week) 2    Duration Progress to 30 minutes of continuous aerobic without signs/symptoms of physical distress      Intensity   THRR 40-80% of Max Heartrate 58-115    Ratings of Perceived Exertion 11-13    Perceived Dyspnea 0-4      Progression   Progression Continue to progress workloads to maintain intensity without signs/symptoms of physical distress.      Resistance Training   Training Prescription Yes    Weight Orange bands    Reps 10-15             Perform Capillary Blood Glucose checks as needed.  Exercise Prescription Changes:   Exercise Prescription Changes     Row Name 08/29/20 1200             Response to Exercise   Blood Pressure (Admit) 126/64       Blood Pressure (Exercise) 124/60       Blood Pressure (Exit) 120/64       Heart Rate (Admit) 78 bpm       Heart Rate (Exercise) 84 bpm       Heart Rate (Exit) 68 bpm       Oxygen Saturation (Admit) 99 %       Oxygen Saturation (Exercise) 100 %       Oxygen Saturation (Exit) 100 %       Rating of Perceived Exertion (Exercise) 12       Perceived Dyspnea (Exercise) 3       Duration Progress to 30 minutes of  aerobic without signs/symptoms of physical distress       Intensity Other (comment)  40-80% of HRR               Progression     Progression Continue to  progress workloads to maintain intensity without signs/symptoms of physical distress.               Resistance Training     Training Prescription Yes       Weight Orange bands       Reps 10-15               Oxygen     Oxygen Continuous       Liters 2               NuStep     Level 1       SPM 70       Minutes 30       METs 1.8               Exercise Comments:   Exercise Comments     Row Name 08/22/20 1156           Exercise Comments Patient completed first day of exercise and tolerated well with no concerns. He is deconditioned but was able to do 15 minutes on the Nustep and 15  minutes on the track with having to take a few rest breaks due to SOB and fatigue. Will continue to monitor.                Exercise Goals and Review:   Exercise Goals     Row Name 08/18/20 1148             Exercise Goals   Increase Physical Activity Yes       Intervention Provide advice, education, support and counseling about physical activity/exercise needs.;Develop an individualized exercise prescription for aerobic and resistive training based on initial evaluation findings, risk stratification, comorbidities and participant's personal goals.       Expected Outcomes Short Term: Attend rehab on a regular basis to increase amount of physical activity.;Long Term: Add in home exercise to make exercise part of routine and to increase amount of physical activity.;Long Term: Exercising regularly at least 3-5 days a week.       Increase Strength and Stamina Yes       Intervention Provide advice, education, support and counseling about physical activity/exercise needs.;Develop an individualized exercise prescription for aerobic and resistive training based on initial evaluation findings, risk stratification, comorbidities and participant's personal goals.       Expected Outcomes Short Term: Increase workloads from initial exercise prescription for resistance, speed, and METs.;Short Term:  Perform resistance training exercises routinely during rehab and add in resistance training at home;Long Term: Improve cardiorespiratory fitness, muscular endurance and strength as measured by increased METs and functional capacity (6MWT)       Able to understand and use rate of perceived exertion (RPE) scale Yes       Intervention Provide education and explanation on how to use RPE scale       Expected Outcomes Short Term: Able to use RPE daily in rehab to express subjective intensity level;Long Term:  Able to use RPE to guide intensity level when exercising independently       Able to understand and use Dyspnea scale Yes       Intervention Provide education and explanation on how to use Dyspnea scale       Expected Outcomes Short Term: Able to use Dyspnea scale daily in rehab to express subjective sense of shortness of breath during exertion;Long Term: Able to use Dyspnea scale to guide intensity level when exercising independently       Knowledge and understanding of Target Heart Rate Range (THRR) Yes       Intervention Provide education and explanation of THRR including how the numbers were predicted and where they are located for reference       Expected Outcomes Short Term: Able to state/look up THRR;Long Term: Able to use THRR to govern intensity when exercising independently;Short Term: Able to use daily as guideline for intensity in rehab       Understanding of Exercise Prescription Yes       Intervention Provide education, explanation, and written materials on patient's individual exercise prescription       Expected Outcomes Short Term: Able to explain program exercise prescription;Long Term: Able to explain home exercise prescription to exercise independently                Exercise Goals Re-Evaluation :  Exercise Goals Re-Evaluation     Row Name 09/04/20 0952             Exercise Goal Re-Evaluation   Exercise Goals Review Increase Physical Activity;Increase Strength and  Stamina;Able to understand and use  rate of perceived exertion (RPE) scale;Able to understand and use Dyspnea scale;Knowledge and understanding of Target Heart Rate Range (THRR);Understanding of Exercise Prescription       Comments Pt has completed 3 exercise sessions and has tolerated well so far. He has been through the program before and is familiar with the routine. He has already increased his workload and increased METS in the short time he has been here. He is exercising at 2.1 METS on the Nustep. Will continue to monitor and progress as he is able.       Expected Outcomes Through exercise at rehab and at home, the patient will decrease shortness of breath with daily activities and feel confident in carrying out an exercise regime at home.                 Discharge Exercise Prescription (Final Exercise Prescription Changes):  Exercise Prescription Changes - 08/29/20 1200       Response to Exercise   Blood Pressure (Admit) 126/64    Blood Pressure (Exercise) 124/60    Blood Pressure (Exit) 120/64    Heart Rate (Admit) 78 bpm    Heart Rate (Exercise) 84 bpm    Heart Rate (Exit) 68 bpm    Oxygen Saturation (Admit) 99 %    Oxygen Saturation (Exercise) 100 %    Oxygen Saturation (Exit) 100 %    Rating of Perceived Exertion (Exercise) 12    Perceived Dyspnea (Exercise) 3    Duration Progress to 30 minutes of  aerobic without signs/symptoms of physical distress    Intensity Other (comment)   40-80% of HRR     Progression   Progression Continue to progress workloads to maintain intensity without signs/symptoms of physical distress.      Resistance Training   Training Prescription Yes    Weight Orange bands    Reps 10-15      Oxygen   Oxygen Continuous    Liters 2      NuStep   Level 1    SPM 70    Minutes 30    METs 1.8             Nutrition:  Target Goals: Understanding of nutrition guidelines, daily intake of sodium <1569m, cholesterol <2050m calories 30% from  fat and 7% or less from saturated fats, daily to have 5 or more servings of fruits and vegetables.  Biometrics:  Pre Biometrics - 08/18/20 1149       Pre Biometrics   Grip Strength 29 kg              Nutrition Therapy Plan and Nutrition Goals:  Nutrition Therapy & Goals - 08/30/20 1436       Nutrition Therapy   Diet General Healthful (high calorie)    Drug/Food Interactions Statins/Certain Fruits      Personal Nutrition Goals   Nutrition Goal Pt to identify food quantities necessary to achieve weight gain 0.5 lbs per week lb at graduation from pulmonary  rehab.    Personal Goal #2 Pt to incorporate nutrient dense and high calorie foods into his 5-6 mini meals per day    Personal Goal #3 Pt to meet exercise guidelines of 30 minutes 3-5 days per week.      Intervention Plan   Intervention Prescribe, educate and counsel regarding individualized specific dietary modifications aiming towards targeted core components such as weight, hypertension, lipid management, diabetes, heart failure and other comorbidities.;Nutrition handout(s) given to patient.    Expected Outcomes Short  Term Goal: A plan has been developed with personal nutrition goals set during dietitian appointment.;Long Term Goal: Adherence to prescribed nutrition plan.             Nutrition Assessments:  MEDIFICTS Score Key: ?70 Need to make dietary changes  40-70 Heart Healthy Diet ? 40 Therapeutic Level Cholesterol Diet   Picture Your Plate Scores: <62 Unhealthy dietary pattern with much room for improvement. 41-50 Dietary pattern unlikely to meet recommendations for good health and room for improvement. 51-60 More healthful dietary pattern, with some room for improvement.  >60 Healthy dietary pattern, although there may be some specific behaviors that could be improved.    Nutrition Goals Re-Evaluation:  Nutrition Goals Re-Evaluation     [place] Name 08/30/20 1437 09/05/20 0700           Goals    Current Weight 124 lb (56.2 kg) 124 lb 12.5 oz (56.6 kg)      Nutrition Goal Pt to identify food quantities necessary to achieve weight gain 0.5 lbs per week lb at graduation from pulmonary  rehab. Pt to identify food quantities necessary to achieve weight gain 0.5 lbs per week lb at graduation from pulmonary  rehab.             Personal Goal #2 Re-Evaluation      Personal Goal #2 Pt to incorporate nutrient dense and high calorie foods into his 5-6 mini meals per day Pt to incorporate nutrient dense and high calorie foods into his 5-6 mini meals per day             Personal Goal #3 Re-Evaluation      Personal Goal #3 Pt to meet exercise guidelines of 30 minutes 3-5 days per week. Pt to meet exercise guidelines of 30 minutes 3-5 days per week.              Nutrition Goals Discharge (Final Nutrition Goals Re-Evaluation):  Nutrition Goals Re-Evaluation - 09/05/20 0700       Goals   Current Weight 124 lb 12.5 oz (56.6 kg)    Nutrition Goal Pt to identify food quantities necessary to achieve weight gain 0.5 lbs per week lb at graduation from pulmonary  rehab.      Personal Goal #2 Re-Evaluation   Personal Goal #2 Pt to incorporate nutrient dense and high calorie foods into his 5-6 mini meals per day      Personal Goal #3 Re-Evaluation   Personal Goal #3 Pt to meet exercise guidelines of 30 minutes 3-5 days per week.             Psychosocial: Target Goals: Acknowledge presence or absence of significant depression and/or stress, maximize coping skills, provide positive support system. Participant is able to verbalize types and ability to use techniques and skills needed for reducing stress and depression.  Initial Review & Psychosocial Screening:  Initial Psych Review & Screening - 08/18/20 0933       Initial Review   Current issues with None Identified      Family Dynamics   Good Support System? Yes   Wife is supportive     Barriers   Psychosocial barriers to participate  in program The patient should benefit from training in stress management and relaxation.      Screening Interventions   Interventions Encouraged to exercise    Expected Outcomes Long Term Goal: Stressors or current issues are controlled or eliminated.;Short Term goal: Identification and review with participant of any Quality of  Life or Depression concerns found by scoring the questionnaire.;Long Term goal: The participant improves quality of Life and PHQ9 Scores as seen by post scores and/or verbalization of changes             Quality of Life Scores:  Scores of 19 and below usually indicate a poorer quality of life in these areas.  A difference of  2-3 points is a clinically meaningful difference.  A difference of 2-3 points in the total score of the Quality of Life Index has been associated with significant improvement in overall quality of life, self-image, physical symptoms, and general health in studies assessing change in quality of life.  PHQ-9: Recent Review Flowsheet Data     Depression screen [facility] 2/9 08/18/2020 02/22/2019 03/13/2018 03/13/2018   Decreased Interest 0 0 0 0   Down, Depressed, Hopeless 0 0 0 0   PHQ - 2 Score 0 0 0 0   Altered sleeping 0 1 0 0   Tired, decreased energy 2 0 3 3   Change in appetite 2  0 3 3   Feeling bad or failure about yourself  0 0 0 0   Trouble concentrating 0 0 0 0   Moving slowly or fidgety/restless 0 0 0 0   Suicidal thoughts 0 0 0 0   PHQ-9 Score - - 6 6   Difficult doing work/chores Not difficult at all Not difficult at all Somewhat difficult Somewhat difficult      Interpretation of Total Score  Total Score Depression Severity:  1-4 = Minimal depression, 5-9 = Mild depression, 10-14 = Moderate depression, 15-19 = Moderately severe depression, 20-27 = Severe depression   Psychosocial Evaluation and Intervention:  Psychosocial Evaluation - 08/18/20 1150       Psychosocial Evaluation & Interventions   Interventions Encouraged to  exercise with the program and follow exercise prescription    Comments Pt has a positive outlook on life and does not show any psychosocial barriers.    Expected Outcomes Pt will continue to have posititve outlook and continue to have healthy coping mechanisms for stressors    Continue Psychosocial Services  No Follow up required             Psychosocial Re-Evaluation:  Psychosocial Re-Evaluation     Kendall Name 09/04/20 1435             Psychosocial Re-Evaluation   Current issues with None Identified       Comments No psychosocial conceerns identified at this time.       Expected Outcomes For Brett Saunders to continue to be free of psychosocial concerns while participating in pulmonary rehab.       Interventions Encouraged to attend Pulmonary Rehabilitation for the exercise       Continue Psychosocial Services  No Follow up required                Psychosocial Discharge (Final Psychosocial Re-Evaluation):  Psychosocial Re-Evaluation - 09/04/20 1435       Psychosocial Re-Evaluation   Current issues with None Identified    Comments No psychosocial conceerns identified at this time.    Expected Outcomes For Brett Saunders to continue to be free of psychosocial concerns while participating in pulmonary rehab.    Interventions Encouraged to attend Pulmonary Rehabilitation for the exercise    Continue Psychosocial Services  No Follow up required             Education: Education Goals: Education classes will be provided on  a weekly basis, covering required topics. Participant will state understanding/return demonstration of topics presented.  Learning Barriers/Preferences:  Learning Barriers/Preferences - 08/18/20 0935       Learning Barriers/Preferences   Learning Barriers None    Learning Preferences Written Material;Computer/Internet;Video             Education Topics: Risk Factor Reduction:  -Group instruction that is supported by a PowerPoint presentation. Instructor  discusses the definition of a risk factor, different risk factors for pulmonary disease, and how the heart and lungs work together.     Nutrition for Pulmonary Patient:  -Group instruction provided by PowerPoint slides, verbal discussion, and written materials to support subject matter. The instructor gives an explanation and review of healthy diet recommendations, which includes a discussion on weight management, recommendations for fruit and vegetable consumption, as well as protein, fluid, caffeine, fiber, sodium, sugar, and alcohol. Tips for eating when patients are short of breath are discussed. Flowsheet Row PULMONARY REHAB CHRONIC OBSTRUCTIVE PULMONARY DISEASE from 08/31/2020 in [place]  Date 08/31/20  Educator Handout       Pursed Lip Breathing:  -Group instruction that is supported by demonstration and informational handouts. Instructor discusses the benefits of pursed lip and diaphragmatic breathing and detailed demonstration on how to preform both.     Oxygen Safety:  -Group instruction provided by PowerPoint, verbal discussion, and written material to support subject matter. There is an overview of "What is Oxygen" and "Why do we need it".  Instructor also reviews how to create a safe environment for oxygen use, the importance of using oxygen as prescribed, and the risks of noncompliance. There is a brief discussion on traveling with oxygen and resources the patient may utilize. Flowsheet Row PULMONARY REHAB CHRONIC OBSTRUCTIVE PULMONARY DISEASE from 05/28/2018 in [place]  Date 04/23/18  Educator Cloyde Reams  Instruction Review Code 1- Verbalizes Understanding       Oxygen Equipment:  -Group instruction provided by [employer] utilizing handouts, written materials, and Insurance underwriter.   Signs and Symptoms:  -Group instruction provided by written material and verbal discussion to support subject  matter. Warning signs and symptoms of infection, stroke, and heart attack are reviewed and when to call the physician/911 reinforced. Tips for preventing the spread of infection discussed. Flowsheet Row PULMONARY REHAB CHRONIC OBSTRUCTIVE PULMONARY DISEASE from 05/28/2018 in [place]  Date 05/14/18  Educator Remo Lipps  Instruction Review Code 1- Verbalizes Understanding       Advanced Directives:  -Group instruction provided by verbal instruction and written material to support subject matter. Instructor reviews Advanced Directive laws and proper instruction for filling out document.   Pulmonary Video:  -Group video education that reviews the importance of medication and oxygen compliance, exercise, good nutrition, pulmonary hygiene, and pursed lip and diaphragmatic breathing for the pulmonary patient.   Exercise for the Pulmonary Patient:  -Group instruction that is supported by a PowerPoint presentation. Instructor discusses benefits of exercise, core components of exercise, frequency, duration, and intensity of an exercise routine, importance of utilizing pulse oximetry during exercise, safety while exercising, and options of places to exercise outside of rehab.   Flowsheet Row PULMONARY REHAB CHRONIC OBSTRUCTIVE PULMONARY DISEASE from 05/28/2018 in [place]  Date 05/07/18  Educator Kirk Ruths  Instruction Review Code 1- Verbalizes Understanding       Pulmonary Medications:  -Verbally interactive group education provided by instructor with focus on inhaled  medications and proper administration. Flowsheet Row PULMONARY REHAB CHRONIC OBSTRUCTIVE PULMONARY DISEASE from 05/27/2019 in [place]  Date 05/27/19  Educator --  [Handout]       Anatomy and Physiology of the Respiratory System and Intimacy:  -Group instruction provided by PowerPoint, verbal discussion, and written material to support  subject matter. Instructor reviews respiratory cycle and anatomical components of the respiratory system and their functions. Instructor also reviews differences in obstructive and restrictive respiratory diseases with examples of each. Intimacy, Sex, and Sexuality differences are reviewed with a discussion on how relationships can change when diagnosed with pulmonary disease. Common sexual concerns are reviewed. Flowsheet Row PULMONARY REHAB CHRONIC OBSTRUCTIVE PULMONARY DISEASE from 05/28/2018 in [place]  Date 05/28/18  Educator rn  Instruction Review Code 1- Verbalizes Understanding       MD DAY -A group question and answer session with a medical doctor that allows participants to ask questions that relate to their pulmonary disease state.   OTHER EDUCATION -Group or individual verbal, written, or video instructions that support the educational goals of the pulmonary rehab program. Jeff Davis from 05/27/2019 in [place]  Date 05/20/19  Educator DF  Gwyndolyn Kaufman your numbers]  Instruction Review Code 2- Demonstrated Understanding       Holiday Eating Survival Tips:  -Group instruction provided by PowerPoint slides, verbal discussion, and written materials to support subject matter. The instructor gives patients tips, tricks, and techniques to help them not only survive but enjoy the holidays despite the onslaught of food that accompanies the holidays.   Knowledge Questionnaire Score:  Knowledge Questionnaire Score - 08/18/20 1204       Knowledge Questionnaire Score   Pre Score 14/18             Core Components/Risk Factors/Patient Goals at Admission:  Personal Goals and Risk Factors at Admission - 08/18/20 1152       Core Components/Risk Factors/Patient Goals on Admission    Weight Management Weight Maintenance    Improve shortness of breath with ADL's  Yes    Intervention Provide education, individualized exercise plan and daily activity instruction to help decrease symptoms of SOB with activities of daily living.    Expected Outcomes Short Term: Improve cardiorespiratory fitness to achieve a reduction of symptoms when performing ADLs;Long Term: Be able to perform more ADLs without symptoms or delay the onset of symptoms             Core Components/Risk Factors/Patient Goals Review:   Goals and Risk Factor Review     Row Name 09/04/20 1436             Core Components/Risk Factors/Patient Goals Review   Personal Goals Review Develop more efficient breathing techniques such as purse lipped breathing and diaphragmatic breathing and practicing self-pacing with activity.;Increase knowledge of respiratory medications and ability to use respiratory devices properly.;Improve shortness of breath with ADL's       Review Brett Saunders has been thru pulmonary rehab 2 times previous, he just started program, has attended 3 exercise sessions, too early to see progression towards goals.       Expected Outcomes See admission goals.                Core Components/Risk Factors/Patient Goals at Discharge (Final Review):   Goals and Risk Factor Review - 09/04/20 1436       Core Components/Risk Factors/Patient Goals  Review   Personal Goals Review Develop more efficient breathing techniques such as purse lipped breathing and diaphragmatic breathing and practicing self-pacing with activity.;Increase knowledge of respiratory medications and ability to use respiratory devices properly.;Improve shortness of breath with ADL's    Review Brett Saunders has been thru pulmonary rehab 2 times previous, he just started program, has attended 3 exercise sessions, too early to see progression towards goals.    Expected Outcomes See admission goals.             ITP Comments:   Comments: ITP REVIEW Pt is making expected progress toward pulmonary rehab goals after  completing 3 sessions. Recommend continued exercise, life style modification, education, and utilization of breathing techniques to increase stamina and strength and decrease shortness of breath with exertion.

## 2020-09-05 NOTE — Progress Notes (Signed)
Daily Session Note  Patient Details  Name: Brett Saunders MRN: [id number] Date of Birth: February 28, 1946 Referring Provider:   April Manson Pulmonary Rehab Walk Test from 08/18/2020 in [place]  Referring Provider Dr. Gwenette Greet ([place])       Encounter Date: 09/05/2020  Check In:  Session Check In - 09/05/20 1159       Check-In   Supervising physician immediately available to respond to emergencies [facility] immediately available    Physician(s) Dr. Arbutus Ped    Location [facility]    Staff Present Rosebud Poles, RN, BSN;Lisa Ysidro Evert, RN;Olinty Celesta Aver, MS, ACSM CEP, Exercise Physiologist;Carlette Wilber Oliphant, RN, BSN;Ramon Dredge, RN, MHA;Jessica Hassell Done, MS, ACSM-CEP, Exercise Physiologist;Meredith Rosana Hoes RD, LDN    Virtual Visit No    Medication changes reported     No    Fall or balance concerns reported    No    Tobacco Cessation No Change    Warm-up and Cool-down Performed as group-led instruction    Resistance Training Performed Yes    VAD Patient? No    PAD/SET Patient? No      Pain Assessment   Currently in Pain? No/denies    Pain Score 0-No pain    Multiple Pain Sites No             Capillary Blood Glucose: No results found for this or any previous visit (from the past 24 hour(s)).    Social History   Tobacco Use  Smoking Status Former   Packs/day: 0.10   Pack years: 0.00   Types: Cigarettes   Quit date: 12/2011   Years since quitting: 8.7  Smokeless Tobacco Never  Tobacco Comments   pt smokes 1 cigarrete occasionally. 1 pack "last a few months"    Goals Met:  Proper associated with RPD/PD & O2 Sat Exercise tolerated well No report of cardiac concerns or symptoms Strength training completed today  Goals Unmet:  Not Applicable  Comments: Service time is from 1015 to 1140    Dr. Fransico Him is Medical Director for Cardiac Rehab at [facility].

## 2020-09-07 ENCOUNTER — Encounter (HOSPITAL_COMMUNITY)
Admission: RE | Admit: 2020-09-07 | Discharge: 2020-09-07 | Disposition: A | Discharge status: Home / Self Care | Payer: No Typology Code available for payment source | Source: Ambulatory Visit | Attending: Cardiology | Admitting: Cardiology

## 2020-09-07 ENCOUNTER — Other Ambulatory Visit: Payer: Self-pay

## 2020-09-07 VITALS — Wt 123.9 lb

## 2020-09-07 DIAGNOSIS — J449 Chronic obstructive pulmonary disease, unspecified: Secondary | ICD-10-CM

## 2020-09-07 NOTE — Progress Notes (Signed)
Daily Session Note  Patient Details  Name: Brett Saunders MRN: [id number] Date of Birth: Oct 09, 1945 Referring Provider:   April Manson Pulmonary Rehab Walk Test from 08/18/2020 in [place]  Referring Provider Dr. Gwenette Greet ([place])       Encounter Date: 09/07/2020  Check In:  Session Check In - 09/07/20 1150       Check-In   Supervising physician immediately available to respond to emergencies [facility] immediately available    Physician(s) Dr. Lonny Prude    [facility]    Staff Present Rosebud Poles, RN, Isaac Laud, MS, ACSM-CEP, Exercise Physiologist;Lisa Ysidro Evert, RN    Virtual Visit No    Medication changes reported     No    Fall or balance concerns reported    No    Tobacco Cessation No Change    Warm-up and Cool-down Performed as group-led instruction    Resistance Training Performed Yes    VAD Patient? No    PAD/SET Patient? No      Pain Assessment   Currently in Pain? No/denies    Pain Score 0-No pain    Multiple Pain Sites No             Capillary Blood Glucose: No results found for this or any previous visit (from the past 24 hour(s)).    Social History   Tobacco Use  Smoking Status Former   Packs/day: 0.10   Pack years: 0.00   Types: Cigarettes   Quit date: 12/2011   Years since quitting: 8.7  Smokeless Tobacco Never  Tobacco Comments   pt smokes 1 cigarrete occasionally. 1 pack "last a few months"    Goals Met:  Proper associated with RPD/PD & O2 Sat Exercise tolerated well Strength training completed today  Goals Unmet:  Not Applicable  Comments: Service time is from 1015 to 1130    Dr. Fransico Him is Medical Director for Cardiac Rehab at [facility].

## 2020-09-12 ENCOUNTER — Encounter (HOSPITAL_COMMUNITY): Payer: No Typology Code available for payment source

## 2020-09-14 ENCOUNTER — Other Ambulatory Visit: Payer: Self-pay

## 2020-09-14 ENCOUNTER — Encounter (HOSPITAL_COMMUNITY)
Admission: RE | Admit: 2020-09-14 | Discharge: 2020-09-14 | Disposition: A | Discharge status: Home / Self Care | Payer: No Typology Code available for payment source | Source: Ambulatory Visit | Attending: Cardiology | Admitting: Cardiology

## 2020-09-14 DIAGNOSIS — J449 Chronic obstructive pulmonary disease, unspecified: Secondary | ICD-10-CM

## 2020-09-14 NOTE — Progress Notes (Signed)
Daily Session Note  Patient Details  Name: Brett Saunders MRN: [id number] Date of Birth: 04/05/1945 Referring Provider:   April Manson Pulmonary Rehab Walk Test from 08/18/2020 in [place]  Referring Provider Dr. Gwenette Greet ([place])       Encounter Date: 09/14/2020  Check In:   Capillary Blood Glucose: No results found for this or any previous visit (from the past 24 hour(s)).    Social History   Tobacco Use  Smoking Status Former   Packs/day: 0.10   Pack years: 0.00   Types: Cigarettes   Quit date: 12/2011   Years since quitting: 8.7  Smokeless Tobacco Never  Tobacco Comments   pt smokes 1 cigarrete occasionally. 1 pack "last a few months"    Goals Met:  Proper associated with RPD/PD & O2 Sat Exercise tolerated well No report of cardiac concerns or symptoms Strength training completed today  Goals Unmet:  Not Applicable  Comments: Service time is from 1015 to 1134    Dr. Fransico Him is Medical Director for Cardiac Rehab at [facility].

## 2020-09-19 ENCOUNTER — Encounter (HOSPITAL_COMMUNITY)
Admission: RE | Admit: 2020-09-19 | Discharge: 2020-09-19 | Disposition: A | Discharge status: Home / Self Care | Payer: No Typology Code available for payment source | Source: Ambulatory Visit | Attending: Cardiology | Admitting: Cardiology

## 2020-09-19 ENCOUNTER — Other Ambulatory Visit: Payer: Self-pay

## 2020-09-19 DIAGNOSIS — J449 Chronic obstructive pulmonary disease, unspecified: Secondary | ICD-10-CM | POA: Insufficient documentation

## 2020-09-19 NOTE — Progress Notes (Signed)
Daily Session Note  Patient Details  Name: Anis Degidio MRN: [id number] Date of Birth: [birth year] Referring Provider:   April Manson Pulmonary Rehab Walk Test from 08/18/2020 in [place]  Referring Provider Dr. Gwenette Greet ([place])       Encounter Date: 09/19/2020  Check In:  Session Check In - 09/19/20 1145       Check-In   Supervising physician immediately available to respond to emergencies [facility] immediately available    Physician(s) Dr. Alfredia Ferguson    [facility]    Staff Present Rosebud Poles, RN, Milus Glazier, MS, ACSM-CEP, CCRP, Exercise Physiologist;Jessica Hassell Done, MS, ACSM-CEP, Exercise Physiologist;Cherika Jessie Ysidro Evert, RN    Virtual Visit No    Medication changes reported     No    Fall or balance concerns reported    No    Tobacco Cessation No Change    Warm-up and Cool-down Performed as group-led instruction    Resistance Training Performed Yes    VAD Patient? No    PAD/SET Patient? No      Pain Assessment   Currently in Pain? No/denies    Multiple Pain Sites No             Capillary Blood Glucose: No results found for this or any previous visit (from the past 24 hour(s)).    Social History   Tobacco Use  Smoking Status Former   Packs/day: 0.10   Pack years: 0.00   Types: Cigarettes   Quit date: 12/2011   Years since quitting: 8.7  Smokeless Tobacco Never  Tobacco Comments   pt smokes 1 cigarrete occasionally. 1 pack "last a few months"    Goals Met:  No report of cardiac concerns or symptoms Strength training completed today  Goals Unmet:  Not Applicable  Comments: Service time is from 1015 to 1130    Dr. Fransico Him is Medical Director for Cardiac Rehab at [facility].

## 2020-09-21 ENCOUNTER — Encounter (HOSPITAL_COMMUNITY): Payer: No Typology Code available for payment source

## 2020-09-26 ENCOUNTER — Encounter (HOSPITAL_COMMUNITY)
Admission: RE | Admit: 2020-09-26 | Discharge: 2020-09-26 | Disposition: A | Discharge status: Home / Self Care | Payer: No Typology Code available for payment source | Source: Ambulatory Visit | Attending: Cardiology | Admitting: Cardiology

## 2020-09-26 ENCOUNTER — Other Ambulatory Visit: Payer: Self-pay

## 2020-09-26 VITALS — Wt 124.1 lb

## 2020-09-26 DIAGNOSIS — J449 Chronic obstructive pulmonary disease, unspecified: Secondary | ICD-10-CM

## 2020-09-26 NOTE — Progress Notes (Signed)
Daily Session Note  Patient Details  Name: Brett Saunders MRN: [id number] Date of Birth: 1945/06/11 Referring Provider:   April Manson Pulmonary Rehab Walk Test from 08/18/2020 in [place]  Referring Provider Dr. Gwenette Greet ([place])       Encounter Date: 09/26/2020  Check In:  Session Check In - 09/26/20 1224       Check-In   Supervising physician immediately available to respond to emergencies [facility] immediately available   Simultaneous filing. User may not have seen previous data.   Physician(s) Dr. Cruzita Lederer   Simultaneous filing. User may not have seen previous data.   [facility]   Simultaneous filing. User may not have seen previous data.   Staff Present Rosebud Poles, RN, Isaac Laud, MS, ACSM-CEP, Exercise Physiologist;Lisa Ysidro Evert, RN   Simultaneous filing. User may not have seen previous data.   Virtual Visit No   Simultaneous filing. User may not have seen previous data.   Medication changes reported     No   Simultaneous filing. User may not have seen previous data.   Fall or balance concerns reported    No   Simultaneous filing. User may not have seen previous data.   Tobacco Cessation No Change   Simultaneous filing. User may not have seen previous data.   Warm-up and Cool-down Performed as group-led instruction   Simultaneous filing. User may not have seen previous data.   Resistance Training Performed Yes   Simultaneous filing. User may not have seen previous data.   VAD Patient? No   Simultaneous filing. User may not have seen previous data.   PAD/SET Patient? No   Simultaneous filing. User may not have seen previous data.     Pain Assessment   Currently in Pain? No/denies   Simultaneous filing. User may not have seen previous data.   Pain Score 0-No pain    Multiple Pain Sites No   Simultaneous filing. User may not have seen previous data.            Capillary Blood Glucose: No results found  for this or any previous visit (from the past 24 hour(s)).   Exercise Prescription Changes - 09/26/20 1200       Response to Exercise   Blood Pressure (Admit) 108/50    Blood Pressure (Exercise) 100/60    Blood Pressure (Exit) 110/60    Heart Rate (Admit) 74 bpm    Heart Rate (Exercise) 65 bpm    Heart Rate (Exit) 77 bpm    Oxygen Saturation (Admit) 97 %    Oxygen Saturation (Exercise) 100 %    Oxygen Saturation (Exit) 100 %    Rating of Perceived Exertion (Exercise) 11    Perceived Dyspnea (Exercise) 3    Duration Continue with 30 min of aerobic exercise without signs/symptoms of physical distress.    Intensity THRR unchanged      Progression   Progression Continue to progress workloads to maintain intensity without signs/symptoms of physical distress.      Resistance Training   Training Prescription Yes    Weight orange bands    Reps 10-15    Time 10 Minutes      Oxygen   Oxygen Continuous    Liters 2      NuStep   Level 3    SPM 80    Minutes 15    METs 1.8      Track   Laps 8    Minutes  15             Social History   Tobacco Use  Smoking Status Former   Packs/day: 0.10   Pack years: 0.00   Types: Cigarettes   Quit date: 12/2011   Years since quitting: 8.7  Smokeless Tobacco Never  Tobacco Comments   pt smokes 1 cigarrete occasionally. 1 pack "last a few months"    Goals Met:  Proper associated with RPD/PD & O2 Sat Exercise tolerated well No report of cardiac concerns or symptoms Strength training completed today  Goals Unmet:  Not Applicable  Comments: Service time is from 1010 to 1133    Dr. Fransico Him is Medical Director for Cardiac Rehab at [facility].

## 2020-09-28 ENCOUNTER — Other Ambulatory Visit: Payer: Self-pay

## 2020-09-28 ENCOUNTER — Encounter (HOSPITAL_COMMUNITY)
Admission: RE | Admit: 2020-09-28 | Discharge: 2020-09-28 | Disposition: A | Discharge status: Home / Self Care | Payer: No Typology Code available for payment source | Source: Ambulatory Visit | Attending: Cardiology | Admitting: Cardiology

## 2020-09-28 DIAGNOSIS — J449 Chronic obstructive pulmonary disease, unspecified: Secondary | ICD-10-CM | POA: Diagnosis not present

## 2020-09-28 NOTE — Progress Notes (Signed)
Daily Session Note  Patient Details  Name: Brett Saunders MRN: [id number] Date of Birth: 10-28-1945 Referring Provider:   April Saunders Pulmonary Rehab Walk Test from 08/18/2020 in [place]  Referring Provider Dr. Gwenette Greet ([place])       Encounter Date: 09/28/2020  Check In:  Session Check In - 09/28/20 1126       Check-In   Supervising physician immediately available to respond to emergencies [facility] immediately available    Physician(s) Dr. Kurtis Bushman    [facility]    Staff Present Esmeralda Links BS, ACSM EP-C, Exercise Physiologist;Sybilla Malhotra Ysidro Evert, RN;Jessica Hassell Done, MS, ACSM-CEP, Exercise Physiologist    Virtual Visit No    Medication changes reported     No    Tobacco Cessation No Change    Warm-up and Cool-down Performed as group-led instruction    Resistance Training Performed Yes    VAD Patient? No    PAD/SET Patient? No      Pain Assessment   Currently in Pain? No/denies    Multiple Pain Sites No             Capillary Blood Glucose: No results found for this or any previous visit (from the past 24 hour(s)).    Social History   Tobacco Use  Smoking Status Former   Packs/day: 0.10   Types: Cigarettes   Quit date: 12/2011   Years since quitting: 8.7  Smokeless Tobacco Never  Tobacco Comments   pt smokes 1 cigarrete occasionally. 1 pack "last a few months"    Goals Met:  Exercise tolerated well No report of cardiac concerns or symptoms Strength training completed today  Goals Unmet:  Not Applicable  Comments: Service time is from 1015 to 1135    Dr. Fransico Him is Medical Director for Cardiac Rehab at [facility].

## 2020-10-03 ENCOUNTER — Other Ambulatory Visit: Payer: Self-pay

## 2020-10-03 ENCOUNTER — Encounter (HOSPITAL_COMMUNITY)
Admission: RE | Admit: 2020-10-03 | Discharge: 2020-10-03 | Disposition: A | Discharge status: Home / Self Care | Payer: No Typology Code available for payment source | Source: Ambulatory Visit | Attending: Cardiology | Admitting: Cardiology

## 2020-10-03 DIAGNOSIS — J449 Chronic obstructive pulmonary disease, unspecified: Secondary | ICD-10-CM

## 2020-10-03 NOTE — Progress Notes (Signed)
Daily Session Note  Patient Details  Name: Mathew Postiglione MRN: [id number] Date of Birth: [birth year] Referring Provider:   April Manson Pulmonary Rehab Walk Test from 08/18/2020 in [place]  Referring Provider Dr. Gwenette Greet ([place])       Encounter Date: 10/03/2020  Check In:  Session Check In - 10/03/20 1148       Check-In   Supervising physician immediately available to respond to emergencies [facility] immediately available    Physician(s) Dr. Sabino Gasser    [facility]    Staff Present Rosebud Poles, RN, Isaac Laud, MS, ACSM-CEP, Exercise Physiologist;Lisa Ysidro Evert, RN    Virtual Visit No    Medication changes reported     No    Fall or balance concerns reported    No    Tobacco Cessation No Change    Warm-up and Cool-down Performed as group-led instruction    Resistance Training Performed Yes    VAD Patient? No    PAD/SET Patient? No      Pain Assessment   Currently in Pain? No/denies    Multiple Pain Sites No             Capillary Blood Glucose: No results found for this or any previous visit (from the past 24 hour(s)).    Social History   Tobacco Use  Smoking Status Former   Packs/day: 0.10   Types: Cigarettes   Quit date: 12/2011   Years since quitting: 8.8  Smokeless Tobacco Never  Tobacco Comments   pt smokes 1 cigarrete occasionally. 1 pack "last a few months"    Goals Met:  Proper associated with RPD/PD & O2 Sat [place] with exercise equipment Exercise tolerated well No report of cardiac concerns or symptoms Strength training completed today  Goals Unmet:  Not Applicable  Comments: Service time is from 1015 to 1135    Dr. Fransico Him is Medical Director for Cardiac Rehab at [facility].

## 2020-10-03 NOTE — Progress Notes (Signed)
Pulmonary Individual Treatment Plan  Patient Details  Name: Brett Saunders MRN: [id number] Date of Birth: 01-Dec-1945 Referring Provider:   April Manson Pulmonary Rehab Walk Test from 08/18/2020 in [facility] [facility]  Referring Provider Dr. Gwenette Greet ([place])       Initial Encounter Date:  Flowsheet Row Pulmonary Rehab Walk Test from 08/18/2020 in [facility]  Date 08/18/20       Visit Diagnosis: Chronic obstructive pulmonary disease, unspecified COPD type ([place])  Patient's Home Medications on Admission:   Current Outpatient Medications:    acetaminophen (TYLENOL) 500 MG tablet, Take 500 mg by mouth 2 (two) times daily. (Patient not taking: Reported on 08/18/2020), Disp: , Rfl:    albuterol (PROVENTIL) (2.5 MG/3ML) 0.083% nebulizer solution, Take 2.5 mg by nebulization every 6 (six) hours as needed for wheezing or shortness of breath., Disp: , Rfl:    albuterol (VENTOLIN HFA) 108 (90 Base) MCG/ACT inhaler, Inhale into the lungs as needed for wheezing or shortness of breath., Disp: , Rfl:    amiodarone (PACERONE) 200 MG tablet, Take 2 tablets (400 mg total) by mouth 2 (two) times daily. 2 tab twice daily for 2 weeks, 1 tablet twice daily for two weeks then one tablet daily (Patient taking differently: Take 200 mg by mouth 2 (two) times daily. 2 tab twice daily for 2 weeks, 1 tablet twice daily for two weeks then one tablet daily), Disp: 90 tablet, Rfl: 1   apixaban (ELIQUIS) 5 MG TABS tablet, Take 2.5 mg by mouth 2 (two) times daily., Disp: , Rfl:    diphenhydrAMINE (BENADRYL) 25 MG tablet, Take 25 mg by mouth at bedtime. (Patient not taking: Reported on 08/18/2020), Disp: , Rfl:    furosemide (LASIX) 20 MG tablet, Take 20 mg by mouth daily. , Disp: , Rfl:    metoprolol succinate (TOPROL-XL) 25 MG 24 hr tablet, Take 12.5 mg by mouth daily. , Disp: , Rfl:    Mometasone Furoate 200 MCG/ACT AERO, Inhale 2 puffs into the lungs at bedtime., Disp: , Rfl:     OLODATEROL HCL IN, Inhale 2.5 mg into the lungs every morning. 2 puffs, Disp: , Rfl:    omeprazole (PRILOSEC) 20 MG capsule, Take 20 mg by mouth daily before breakfast., Disp: , Rfl:    OXYGEN, Inhale 2 L/min into the lungs continuous., Disp: , Rfl:    pravastatin (PRAVACHOL) 40 MG tablet, Take 40 mg by mouth at bedtime., Disp: , Rfl:    rOPINIRole (REQUIP) 0.25 MG tablet, Take 0.25 mg by mouth 2 (two) times daily., Disp: , Rfl:    sodium chloride 1 g tablet, Take 1 g by mouth 3 (three) times daily., Disp: , Rfl:   Past Medical History: Past Medical History:  Diagnosis Date   Aortic anomaly    aortic calcification   Arrhythmia    a fib   Atrial fibrillation (HCC)    CAD (coronary artery disease)    CHF (congestive heart failure) (HCC)    Chronic obstructive lung disease (HCC)    COPD (chronic obstructive pulmonary disease) ([place])    Diverticulosis 2010   colonoscopy 08-2008   DVT (deep venous thrombosis) (HCC)    GERD (gastroesophageal reflux disease)    Hemorrhoids    History of atrial fibrillation    Hypertension    Ischemic cardiomyopathy    Lung cancer ([place])    lung   On home oxygen therapy    Peripheral vascular disease ([place])    Stroke (  [place])    oct 2012    Tobacco Use: Social History   Tobacco Use  Smoking Status Former   Packs/day: 0.10   Types: Cigarettes   Quit date: 12/2011   Years since quitting: 8.8  Smokeless Tobacco Never  Tobacco Comments   pt smokes 1 cigarrete occasionally. 1 pack "last a few months"    Labs: Recent Review Flowsheet Data   There is no flowsheet data to display.     Capillary Blood Glucose: No results found for: GLUCAP   Pulmonary Assessment Scores:  Pulmonary Assessment Scores     Row Name 08/18/20 1208         ADL UCSD   ADL Phase Exit     SOB Score total 35           CAT Score     CAT Score 20           mMRC Score     mMRC Score 3            UCSD: Self-administered rating of dyspnea associated with  activities of daily living (ADLs) 6-point scale (0 = "not at all" to 5 = "maximal or unable to do because of breathlessness")  Scoring Scores range from 0 to 120.  Minimally important difference is 5 units  CAT: CAT can identify the health impairment of COPD patients and is better correlated with disease progression.  CAT has a scoring range of zero to 40. The CAT score is classified into four groups of low (less than 10), medium (10 - 20), high (21-30) and very high (31-40) based on the impact level of disease on health status. A CAT score over 10 suggests significant symptoms.  A worsening CAT score could be explained by an exacerbation, poor medication adherence, poor inhaler technique, or progression of COPD or comorbid conditions.  CAT MCID is 2 points  mMRC: mMRC (Modified Medical Research Council) Dyspnea Scale is used to assess the degree of baseline functional disability in patients of respiratory disease due to dyspnea. No minimal important difference is established. A decrease in score of 1 point or greater is considered a positive change.   Pulmonary Function Assessment:  Pulmonary Function Assessment - 08/18/20 0932       Breath   Shortness of Breath Yes;Limiting activity;Panic with Shortness of Breath             Exercise Target Goals: Exercise Program Goal: Individual exercise prescription set using results from initial 6 min walk test and THRR while considering  patient's activity barriers and safety.   Exercise Prescription Goal: Initial exercise prescription builds to 30-45 minutes a day of aerobic activity, 2-3 days per week.  Home exercise guidelines will be given to patient during program as part of exercise prescription that the participant will acknowledge.  Activity Barriers & Risk Stratification:  Activity Barriers & Cardiac Risk Stratification - 08/18/20 0930       Activity Barriers & Cardiac Risk Stratification   Activity Barriers Shortness of  Breath;Back Problems;Deconditioning;Muscular Weakness             6 Minute Walk:  6 Minute Walk     Row Name 08/18/20 1141         6 Minute Walk   Phase Initial     Distance 872 feet     Walk Time 6 minutes     # of Rest Breaks 1  1 seated rest break for 45 seconds due to leg fatigue and shortness  of breath     MPH 1.65     METS 2.39     RPE 12     Perceived Dyspnea  2     VO2 Peak 8.38     Symptoms Yes (comment)     Comments leg weakness/fatigue     Resting HR 82 bpm     Resting BP 110/50     Resting Oxygen Saturation  100 %     Exercise Oxygen Saturation  during 6 min walk 95 %     Max Ex. HR 98 bpm     Max Ex. BP 130/60     2 Minute Post BP 120/60           Interval HR     1 Minute HR 97     2 Minute HR 95     3 Minute HR 96     4 Minute HR 97     5 Minute HR 97     6 Minute HR 95     2 Minute Post HR 98     Interval Heart Rate? Yes           Interval Oxygen     Interval Oxygen? Yes     Baseline Oxygen Saturation % 99 %     1 Minute Oxygen Saturation % 99 %     1 Minute Liters of Oxygen 2 L     2 Minute Oxygen Saturation % 98 %     2 Minute Liters of Oxygen 2 L     3 Minute Oxygen Saturation % 97 %     3 Minute Liters of Oxygen 2 L     4 Minute Oxygen Saturation % 97 %     4 Minute Liters of Oxygen 2 L     5 Minute Oxygen Saturation % 97 %     5 Minute Liters of Oxygen 2 L     6 Minute Oxygen Saturation % 95 %     6 Minute Liters of Oxygen 2 L     2 Minute Post Oxygen Saturation % 98 %     2 Minute Post Liters of Oxygen 2 L             Oxygen Initial Assessment:  Oxygen Initial Assessment - 08/18/20 0931       Home Oxygen   Home Oxygen Device Portable Concentrator;Home Concentrator    Sleep Oxygen Prescription Continuous    Liters per minute 2    Home Exercise Oxygen Prescription Pulsed    Liters per minute 2    Home Resting Oxygen Prescription None    Compliance with Home Oxygen Use Yes      Initial 6 min Walk   Oxygen Used  Continuous    Liters per minute 2      Program Oxygen Prescription   Program Oxygen Prescription Continuous    Liters per minute 2      Intervention   Short Term Goals To learn and exhibit compliance with exercise, home and travel O2 prescription;To learn and understand importance of maintaining oxygen saturations>88%;To learn and demonstrate proper use of respiratory medications;To learn and understand importance of monitoring SPO2 with pulse oximeter and demonstrate accurate use of the pulse oximeter.;To learn and demonstrate proper pursed lip breathing techniques or other breathing techniques.     Long  Term Goals Exhibits compliance with exercise, home  and travel O2 prescription;Verbalizes importance of monitoring SPO2 with pulse oximeter and return demonstration;Maintenance  of O2 saturations>88%;Exhibits proper breathing techniques, such as pursed lip breathing or other method taught during program session;Compliance with respiratory medication;Demonstrates proper use of MDI's             Oxygen Re-Evaluation:  Oxygen Re-Evaluation     Row Name 09/04/20 0954 10/02/20 1414           Program Oxygen Prescription   Program Oxygen Prescription Continuous Continuous      Liters per minute 2 2             Home Oxygen      Home Oxygen Device Portable Concentrator;Home Concentrator Portable Concentrator;Home Concentrator      Sleep Oxygen Prescription Continuous Continuous      Liters per minute 2 2      Home Exercise Oxygen Prescription Pulsed Pulsed      Liters per minute 2 2      Home Resting Oxygen Prescription None None      Compliance with Home Oxygen Use Yes Yes             Goals/Expected Outcomes      Short Term Goals To learn and exhibit compliance with exercise, home and travel O2 prescription;To learn and understand importance of maintaining oxygen saturations>88%;To learn and demonstrate proper use of respiratory medications;To learn and understand importance of  monitoring SPO2 with pulse oximeter and demonstrate accurate use of the pulse oximeter.;To learn and demonstrate proper pursed lip breathing techniques or other breathing techniques.  To learn and exhibit compliance with exercise, home and travel O2 prescription;To learn and understand importance of maintaining oxygen saturations>88%;To learn and demonstrate proper use of respiratory medications;To learn and understand importance of monitoring SPO2 with pulse oximeter and demonstrate accurate use of the pulse oximeter.;To learn and demonstrate proper pursed lip breathing techniques or other breathing techniques.       Long  Term Goals Exhibits compliance with exercise, home  and travel O2 prescription;Verbalizes importance of monitoring SPO2 with pulse oximeter and return demonstration;Maintenance of O2 saturations>88%;Exhibits proper breathing techniques, such as pursed lip breathing or other method taught during program session;Compliance with respiratory medication;Demonstrates proper use of MDI's Exhibits compliance with exercise, home  and travel O2 prescription;Verbalizes importance of monitoring SPO2 with pulse oximeter and return demonstration;Maintenance of O2 saturations>88%;Exhibits proper breathing techniques, such as pursed lip breathing or other method taught during program session;Compliance with respiratory medication;Demonstrates proper use of MDI's      Goals/Expected Outcomes compliance and understanding of monitoring oxygen saturation and performing pursed lip breathing when needed. compliance and understanding of monitoring oxygen saturation and performing pursed lip breathing when needed.              Oxygen Discharge (Final Oxygen Re-Evaluation):  Oxygen Re-Evaluation - 10/02/20 1414       Program Oxygen Prescription   Program Oxygen Prescription Continuous    Liters per minute 2      Home Oxygen   Home Oxygen Device Portable Concentrator;Home Concentrator    Sleep Oxygen  Prescription Continuous    Liters per minute 2    Home Exercise Oxygen Prescription Pulsed    Liters per minute 2    Home Resting Oxygen Prescription None    Compliance with Home Oxygen Use Yes      Goals/Expected Outcomes   Short Term Goals To learn and exhibit compliance with exercise, home and travel O2 prescription;To learn and understand importance of maintaining oxygen saturations>88%;To learn and demonstrate proper use of respiratory medications;To learn and understand  importance of monitoring SPO2 with pulse oximeter and demonstrate accurate use of the pulse oximeter.;To learn and demonstrate proper pursed lip breathing techniques or other breathing techniques.     Long  Term Goals Exhibits compliance with exercise, home  and travel O2 prescription;Verbalizes importance of monitoring SPO2 with pulse oximeter and return demonstration;Maintenance of O2 saturations>88%;Exhibits proper breathing techniques, such as pursed lip breathing or other method taught during program session;Compliance with respiratory medication;Demonstrates proper use of MDI's    Goals/Expected Outcomes compliance and understanding of monitoring oxygen saturation and performing pursed lip breathing when needed.             Initial Exercise Prescription:  Initial Exercise Prescription - 08/18/20 1100       Date of Initial Exercise RX and Referring Provider   Date 08/18/20    Referring Provider Dr. Gwenette Greet ([place])    Expected Discharge Date 10/19/20      Oxygen   Oxygen Continuous    Liters 2      NuStep   Level 1    SPM 70    Minutes 15      Track   Minutes 15      Prescription Details   Frequency (times per week) 2    Duration Progress to 30 minutes of continuous aerobic without signs/symptoms of physical distress      Intensity   THRR 40-80% of Max Heartrate 58-115    Ratings of Perceived Exertion 11-13    Perceived Dyspnea 0-4      Progression   Progression Continue to progress workloads to  maintain intensity without signs/symptoms of physical distress.      Resistance Training   Training Prescription Yes    Weight Orange bands    Reps 10-15             Perform Capillary Blood Glucose checks as needed.  Exercise Prescription Changes:   Exercise Prescription Changes     Row Name 08/29/20 1200 09/07/20 1201 09/26/20 1200         Response to Exercise   Blood Pressure (Admit) 126/64 118/56 108/50     Blood Pressure (Exercise) 124/60 -- 100/60     Blood Pressure (Exit) 120/64 124/54 110/60     Heart Rate (Admit) 78 bpm 76 bpm 74 bpm     Heart Rate (Exercise) 84 bpm 94 bpm 65 bpm     Heart Rate (Exit) 68 bpm 72 bpm 77 bpm     Oxygen Saturation (Admit) 99 % 98 % 97 %     Oxygen Saturation (Exercise) 100 % 95 % 100 %     Oxygen Saturation (Exit) 100 % 91 % 100 %     Rating of Perceived Exertion (Exercise) 12 12 11      Perceived Dyspnea (Exercise) 3 3 3      Duration Progress to 30 minutes of  aerobic without signs/symptoms of physical distress Progress to 30 minutes of  aerobic without signs/symptoms of physical distress Continue with 30 min of aerobic exercise without signs/symptoms of physical distress.     Intensity Other (comment)  40-80% of HRR THRR unchanged THRR unchanged           Progression       Progression Continue to progress workloads to maintain intensity without signs/symptoms of physical distress. Continue to progress workloads to maintain intensity without signs/symptoms of physical distress. Continue to progress workloads to maintain intensity without signs/symptoms of physical distress.  Resistance Training       Training Prescription Yes Yes Yes     Weight Orange bands Orange bands orange bands     Reps 10-15 10-15 10-15     Time -- 10 Minutes 10 Minutes           Oxygen       Oxygen Continuous Continuous Continuous     Liters $Remov'2 2 2           'VImcjf$ NuStep       Level $Remo'1 2 3     'qonLO$ SPM 70 80 80     Minutes $Remove'30 15 15     'dzgsIGY$ METs 1.8 1.9  1.8           Track       Laps -- 10 8     Minutes -- 15 15     METs -- 2.16 --             Exercise Comments:   Exercise Comments     Row Name 08/22/20 1156           Exercise Comments Patient completed first day of exercise and tolerated well with no concerns. He is deconditioned but was able to do 15 minutes on the Nustep and 15 minutes on the track with having to take a few rest breaks due to SOB and fatigue. Will continue to monitor.                Exercise Goals and Review:   Exercise Goals     Row Name 08/18/20 1148             Exercise Goals   Increase Physical Activity Yes       Intervention Provide advice, education, support and counseling about physical activity/exercise needs.;Develop an individualized exercise prescription for aerobic and resistive training based on initial evaluation findings, risk stratification, comorbidities and participant's personal goals.       Expected Outcomes Short Term: Attend rehab on a regular basis to increase amount of physical activity.;Long Term: Add in home exercise to make exercise part of routine and to increase amount of physical activity.;Long Term: Exercising regularly at least 3-5 days a week.       Increase Strength and Stamina Yes       Intervention Provide advice, education, support and counseling about physical activity/exercise needs.;Develop an individualized exercise prescription for aerobic and resistive training based on initial evaluation findings, risk stratification, comorbidities and participant's personal goals.       Expected Outcomes Short Term: Increase workloads from initial exercise prescription for resistance, speed, and METs.;Short Term: Perform resistance training exercises routinely during rehab and add in resistance training at home;Long Term: Improve cardiorespiratory fitness, muscular endurance and strength as measured by increased METs and functional capacity (6MWT)       Able to understand  and use rate of perceived exertion (RPE) scale Yes       Intervention Provide education and explanation on how to use RPE scale       Expected Outcomes Short Term: Able to use RPE daily in rehab to express subjective intensity level;Long Term:  Able to use RPE to guide intensity level when exercising independently       Able to understand and use Dyspnea scale Yes       Intervention Provide education and explanation on how to use Dyspnea scale       Expected Outcomes Short Term: Able to use Dyspnea scale daily in rehab  to express subjective sense of shortness of breath during exertion;Long Term: Able to use Dyspnea scale to guide intensity level when exercising independently       Knowledge and understanding of Target Heart Rate Range (THRR) Yes       Intervention Provide education and explanation of THRR including how the numbers were predicted and where they are located for reference       Expected Outcomes Short Term: Able to state/look up THRR;Long Term: Able to use THRR to govern intensity when exercising independently;Short Term: Able to use daily as guideline for intensity in rehab       Understanding of Exercise Prescription Yes       Intervention Provide education, explanation, and written materials on patient's individual exercise prescription       Expected Outcomes Short Term: Able to explain program exercise prescription;Long Term: Able to explain home exercise prescription to exercise independently                Exercise Goals Re-Evaluation :  Exercise Goals Re-Evaluation     Row Name 09/04/20 7672 10/02/20 1408           Exercise Goal Re-Evaluation   Exercise Goals Review Increase Physical Activity;Increase Strength and Stamina;Able to understand and use rate of perceived exertion (RPE) scale;Able to understand and use Dyspnea scale;Knowledge and understanding of Target Heart Rate Range (THRR);Understanding of Exercise Prescription Increase Physical Activity;Increase  Strength and Stamina;Able to understand and use rate of perceived exertion (RPE) scale;Able to understand and use Dyspnea scale;Knowledge and understanding of Target Heart Rate Range (THRR);Understanding of Exercise Prescription      Comments Pt has completed 3 exercise sessions and has tolerated well so far. He has been through the program before and is familiar with the routine. He has already increased his workload and increased METS in the short time he has been here. He is exercising at 2.1 METS on the Nustep. Will continue to monitor and progress as he is able. Brett Saunders has completed 9 exercise sessions and has been consistent with workload increases on the Nustep but has not made much progression with increasing MET level. We also have him walking the track now, but he does not "want" to do this on a regular basis. Basically whenever he "feels like it" he walks the track, which has been 4 out of 9 sessions. He is currently exercising at 1.8 METS on the Nustep and 1.93 METS on the track. Will continue to monitor and progress as he is able.      Expected Outcomes Through exercise at rehab and at home, the patient will decrease shortness of breath with daily activities and feel confident in carrying out an exercise regime at home.  Through exercise at rehab and at home, the patient will decrease shortness of breath with daily activities and feel confident in carrying out an exercise regime at home.                Discharge Exercise Prescription (Final Exercise Prescription Changes):  Exercise Prescription Changes - 09/26/20 1200       Response to Exercise   Blood Pressure (Admit) 108/50    Blood Pressure (Exercise) 100/60    Blood Pressure (Exit) 110/60    Heart Rate (Admit) 74 bpm    Heart Rate (Exercise) 65 bpm    Heart Rate (Exit) 77 bpm    Oxygen Saturation (Admit) 97 %    Oxygen Saturation (Exercise) 100 %    Oxygen Saturation (Exit)  100 %    Rating of Perceived Exertion (Exercise) 11     Perceived Dyspnea (Exercise) 3    Duration Continue with 30 min of aerobic exercise without signs/symptoms of physical distress.    Intensity THRR unchanged      Progression   Progression Continue to progress workloads to maintain intensity without signs/symptoms of physical distress.      Resistance Training   Training Prescription Yes    Weight orange bands    Reps 10-15    Time 10 Minutes      Oxygen   Oxygen Continuous    Liters 2      NuStep   Level 3    SPM 80    Minutes 15    METs 1.8      Track   Laps 8    Minutes 15             Nutrition:  Target Goals: Understanding of nutrition guidelines, daily intake of sodium '1500mg'$ , cholesterol '200mg'$ , calories 30% from fat and 7% or less from saturated fats, daily to have 5 or more servings of fruits and vegetables.  Biometrics:  Pre Biometrics - 08/18/20 1149       Pre Biometrics   Grip Strength 29 kg              Nutrition Therapy Plan and Nutrition Goals:  Nutrition Therapy & Goals - 08/30/20 1436       Nutrition Therapy   Diet General Healthful (high calorie)    Drug/Food Interactions Statins/Certain Fruits      Personal Nutrition Goals   Nutrition Goal Pt to identify food quantities necessary to achieve weight gain 0.5 lbs per week lb at graduation from pulmonary  rehab.    Personal Goal #2 Pt to incorporate nutrient dense and high calorie foods into his 5-6 mini meals per day    Personal Goal #3 Pt to meet exercise guidelines of 30 minutes 3-5 days per week.      Intervention Plan   Intervention Prescribe, educate and counsel regarding individualized specific dietary modifications aiming towards targeted core components such as weight, hypertension, lipid management, diabetes, heart failure and other comorbidities.;Nutrition handout(s) given to patient.    Expected Outcomes Short Term Goal: A plan has been developed with personal nutrition goals set during dietitian appointment.;Long Term  Goal: Adherence to prescribed nutrition plan.             Nutrition Assessments:  MEDIFICTS Score Key: ?70 Need to make dietary changes  40-70 Heart Healthy Diet ? 40 Therapeutic Level Cholesterol Diet   Picture Your Plate Scores: <96 Unhealthy dietary pattern with much room for improvement. 41-50 Dietary pattern unlikely to meet recommendations for good health and room for improvement. 51-60 More healthful dietary pattern, with some room for improvement.  >60 Healthy dietary pattern, although there may be some specific behaviors that could be improved.    Nutrition Goals Re-Evaluation:  Nutrition Goals Re-Evaluation     [place] Name 08/30/20 1437 09/05/20 0700 10/03/20 7591         Goals   Current Weight 124 lb (56.2 kg) 124 lb 12.5 oz (56.6 kg) 124 lb 1.9 oz (56.3 kg)     Nutrition Goal Pt to identify food quantities necessary to achieve weight gain 0.5 lbs per week lb at graduation from pulmonary  rehab. Pt to identify food quantities necessary to achieve weight gain 0.5 lbs per week lb at graduation from pulmonary  rehab. Pt to identify  food quantities necessary to achieve weight gain 0.5 lbs per week lb at graduation from pulmonary  rehab.           Personal Goal #2 Re-Evaluation       Personal Goal #2 Pt to incorporate nutrient dense and high calorie foods into his 5-6 mini meals per day Pt to incorporate nutrient dense and high calorie foods into his 5-6 mini meals per day Pt to incorporate nutrient dense and high calorie foods into his 5-6 mini meals per day           Personal Goal #3 Re-Evaluation       Personal Goal #3 Pt to meet exercise guidelines of 30 minutes 3-5 days per week. Pt to meet exercise guidelines of 30 minutes 3-5 days per week. Pt to meet exercise guidelines of 30 minutes 3-5 days per week.             Nutrition Goals Discharge (Final Nutrition Goals Re-Evaluation):  Nutrition Goals Re-Evaluation - 10/03/20 0639       Goals   Current Weight  124 lb 1.9 oz (56.3 kg)    Nutrition Goal Pt to identify food quantities necessary to achieve weight gain 0.5 lbs per week lb at graduation from pulmonary  rehab.      Personal Goal #2 Re-Evaluation   Personal Goal #2 Pt to incorporate nutrient dense and high calorie foods into his 5-6 mini meals per day      Personal Goal #3 Re-Evaluation   Personal Goal #3 Pt to meet exercise guidelines of 30 minutes 3-5 days per week.             Psychosocial: Target Goals: Acknowledge presence or absence of significant depression and/or stress, maximize coping skills, provide positive support system. Participant is able to verbalize types and ability to use techniques and skills needed for reducing stress and depression.  Initial Review & Psychosocial Screening:  Initial Psych Review & Screening - 08/18/20 0933       Initial Review   Current issues with None Identified      Family Dynamics   Good Support System? Yes   Wife is supportive     Barriers   Psychosocial barriers to participate in program The patient should benefit from training in stress management and relaxation.      Screening Interventions   Interventions Encouraged to exercise    Expected Outcomes Long Term Goal: Stressors or current issues are controlled or eliminated.;Short Term goal: Identification and review with participant of any Quality of Life or Depression concerns found by scoring the questionnaire.;Long Term goal: The participant improves quality of Life and PHQ9 Scores as seen by post scores and/or verbalization of changes             Quality of Life Scores:  Scores of 19 and below usually indicate a poorer quality of life in these areas.  A difference of  2-3 points is a clinically meaningful difference.  A difference of 2-3 points in the total score of the Quality of Life Index has been associated with significant improvement in overall quality of life, self-image, physical symptoms, and general health in  studies assessing change in quality of life.  PHQ-9: Recent Review Flowsheet Data     Depression screen [facility] 2/9 08/18/2020 02/22/2019 03/13/2018 03/13/2018   Decreased Interest 0 0 0 0   Down, Depressed, Hopeless 0 0 0 0   PHQ - 2 Score 0 0 0 0   Altered sleeping  0 1 0 0   Tired, decreased energy 2 0 3 3   Change in appetite 2  0 3 3   Feeling bad or failure about yourself  0 0 0 0   Trouble concentrating 0 0 0 0   Moving slowly or fidgety/restless 0 0 0 0   Suicidal thoughts 0 0 0 0   PHQ-9 Score - - 6 6   Difficult doing work/chores Not difficult at all Not difficult at all Somewhat difficult Somewhat difficult      Interpretation of Total Score  Total Score Depression Severity:  1-4 = Minimal depression, 5-9 = Mild depression, 10-14 = Moderate depression, 15-19 = Moderately severe depression, 20-27 = Severe depression   Psychosocial Evaluation and Intervention:  Psychosocial Evaluation - 08/18/20 1150       Psychosocial Evaluation & Interventions   Interventions Encouraged to exercise with the program and follow exercise prescription    Comments Pt has a positive outlook on life and does not show any psychosocial barriers.    Expected Outcomes Pt will continue to have posititve outlook and continue to have healthy coping mechanisms for stressors    Continue Psychosocial Services  No Follow up required             Psychosocial Re-Evaluation:  Psychosocial Re-Evaluation     [place] Name 09/04/20 1435 10/02/20 1150           Psychosocial Re-Evaluation   Current issues with None Identified None Identified      Comments No psychosocial conceerns identified at this time. No concerns identified      Expected Outcomes For Brett Saunders to continue to be free of psychosocial concerns while participating in pulmonary rehab. For Brett Saunders to continue to be free of psychosocial concerns while participating in pulmonary rehab.      Interventions Encouraged to attend Pulmonary Rehabilitation  for the exercise Encouraged to attend Pulmonary Rehabilitation for the exercise      Continue Psychosocial Services  No Follow up required No Follow up required               Psychosocial Discharge (Final Psychosocial Re-Evaluation):  Psychosocial Re-Evaluation - 10/02/20 1150       Psychosocial Re-Evaluation   Current issues with None Identified    Comments No concerns identified    Expected Outcomes For Brett Saunders to continue to be free of psychosocial concerns while participating in pulmonary rehab.    Interventions Encouraged to attend Pulmonary Rehabilitation for the exercise    Continue Psychosocial Services  No Follow up required             Education: Education Goals: Education classes will be provided on a weekly basis, covering required topics. Participant will state understanding/return demonstration of topics presented.  Learning Barriers/Preferences:  Learning Barriers/Preferences - 08/18/20 0935       Learning Barriers/Preferences   Learning Barriers None    Learning Preferences Written Material;Computer/Internet;Video             Education Topics: Risk Factor Reduction:  -Group instruction that is supported by a PowerPoint presentation. Instructor discusses the definition of a risk factor, different risk factors for pulmonary disease, and how the heart and lungs work together.   Flowsheet Row PULMONARY REHAB CHRONIC OBSTRUCTIVE PULMONARY DISEASE from 09/28/2020 in [place]  Date 09/07/20  Educator handout       Nutrition for Pulmonary Patient:  -Group instruction provided by PowerPoint slides, verbal discussion, and written materials to  support subject matter. The instructor gives an explanation and review of healthy diet recommendations, which includes a discussion on weight management, recommendations for fruit and vegetable consumption, as well as protein, fluid, caffeine, fiber, sodium, sugar, and alcohol. Tips for  eating when patients are short of breath are discussed. Flowsheet Row PULMONARY REHAB CHRONIC OBSTRUCTIVE PULMONARY DISEASE from 09/28/2020 in [place]  Date 08/31/20  Educator Handout       Pursed Lip Breathing:  -Group instruction that is supported by demonstration and informational handouts. Instructor discusses the benefits of pursed lip and diaphragmatic breathing and detailed demonstration on how to preform both.     Oxygen Safety:  -Group instruction provided by PowerPoint, verbal discussion, and written material to support subject matter. There is an overview of "What is Oxygen" and "Why do we need it".  Instructor also reviews how to create a safe environment for oxygen use, the importance of using oxygen as prescribed, and the risks of noncompliance. There is a brief discussion on traveling with oxygen and resources the patient may utilize. Flowsheet Row PULMONARY REHAB CHRONIC OBSTRUCTIVE PULMONARY DISEASE from 05/28/2018 in [place]  Date 04/23/18  Educator Cloyde Reams  Instruction Review Code 1- Verbalizes Understanding       Oxygen Equipment:  -Group instruction provided by [employer] utilizing handouts, written materials, and Insurance underwriter.   Signs and Symptoms:  -Group instruction provided by written material and verbal discussion to support subject matter. Warning signs and symptoms of infection, stroke, and heart attack are reviewed and when to call the physician/911 reinforced. Tips for preventing the spread of infection discussed. Flowsheet Row PULMONARY REHAB CHRONIC OBSTRUCTIVE PULMONARY DISEASE from 05/28/2018 in [place]  Date 05/14/18  Educator Remo Lipps  Instruction Review Code 1- Verbalizes Understanding       Advanced Directives:  -Group instruction provided by verbal instruction and written material to support subject matter. Instructor reviews  Advanced Directive laws and proper instruction for filling out document.   Pulmonary Video:  -Group video education that reviews the importance of medication and oxygen compliance, exercise, good nutrition, pulmonary hygiene, and pursed lip and diaphragmatic breathing for the pulmonary patient.   Exercise for the Pulmonary Patient:  -Group instruction that is supported by a PowerPoint presentation. Instructor discusses benefits of exercise, core components of exercise, frequency, duration, and intensity of an exercise routine, importance of utilizing pulse oximetry during exercise, safety while exercising, and options of places to exercise outside of rehab.   Flowsheet Row PULMONARY REHAB CHRONIC OBSTRUCTIVE PULMONARY DISEASE from 09/28/2020 in [place]  Date 09/28/20  Educator Handout       Pulmonary Medications:  -Verbally interactive group education provided by instructor with focus on inhaled medications and proper administration. Flowsheet Row PULMONARY REHAB CHRONIC OBSTRUCTIVE PULMONARY DISEASE from 05/27/2019 in [place]  Date 05/27/19  Educator --  [Handout]       Anatomy and Physiology of the Respiratory System and Intimacy:  -Group instruction provided by PowerPoint, verbal discussion, and written material to support subject matter. Instructor reviews respiratory cycle and anatomical components of the respiratory system and their functions. Instructor also reviews differences in obstructive and restrictive respiratory diseases with examples of each. Intimacy, Sex, and Sexuality differences are reviewed with a discussion on how relationships can change when diagnosed with pulmonary disease. Common sexual concerns are reviewed. Flowsheet Row PULMONARY REHAB CHRONIC OBSTRUCTIVE PULMONARY DISEASE  from 05/28/2018 in [place]  Date 05/28/18  Educator rn  Instruction Review Code 1-  Verbalizes Understanding       MD DAY -A group question and answer session with a medical doctor that allows participants to ask questions that relate to their pulmonary disease state.   OTHER EDUCATION -Group or individual verbal, written, or video instructions that support the educational goals of the pulmonary rehab program. Brett Saunders from 05/27/2019 in [place]  Date 05/20/19  Educator DF  Gwyndolyn Kaufman your numbers]  Instruction Review Code 2- Demonstrated Understanding       Holiday Eating Survival Tips:  -Group instruction provided by PowerPoint slides, verbal discussion, and written materials to support subject matter. The instructor gives patients tips, tricks, and techniques to help them not only survive but enjoy the holidays despite the onslaught of food that accompanies the holidays.   Knowledge Questionnaire Score:  Knowledge Questionnaire Score - 08/18/20 1204       Knowledge Questionnaire Score   Pre Score 14/18             Core Components/Risk Factors/Patient Goals at Admission:  Personal Goals and Risk Factors at Admission - 08/18/20 1152       Core Components/Risk Factors/Patient Goals on Admission    Weight Management Weight Maintenance    Improve shortness of breath with ADL's Yes    Intervention Provide education, individualized exercise plan and daily activity instruction to help decrease symptoms of SOB with activities of daily living.    Expected Outcomes Short Term: Improve cardiorespiratory fitness to achieve a reduction of symptoms when performing ADLs;Long Term: Be able to perform more ADLs without symptoms or delay the onset of symptoms             Core Components/Risk Factors/Patient Goals Review:   Goals and Risk Factor Review     Row Name 09/04/20 1436 10/02/20 1151           Core Components/Risk Factors/Patient Goals Review   Personal Goals  Review Develop more efficient breathing techniques such as purse lipped breathing and diaphragmatic breathing and practicing self-pacing with activity.;Increase knowledge of respiratory medications and ability to use respiratory devices properly.;Improve shortness of breath with ADL's Develop more efficient breathing techniques such as purse lipped breathing and diaphragmatic breathing and practicing self-pacing with activity.;Increase knowledge of respiratory medications and ability to use respiratory devices properly.;Improve shortness of breath with ADL's      Review Brett Saunders has been thru pulmonary rehab 2 times previous, he just started program, has attended 3 exercise sessions, too early to see progression towards goals. Brett Saunders is slow to progress and uses 2L of supplemental oxygen to exercise.  He has extreme shortness of breath especially with walking.  This is the 3rd time he has been thru [employer].  He maintains, but never seems to progress.      Expected Outcomes See admission goals. See admission goals.               Core Components/Risk Factors/Patient Goals at Discharge (Final Review):   Goals and Risk Factor Review - 10/02/20 1151       Core Components/Risk Factors/Patient Goals Review   Personal Goals Review Develop more efficient breathing techniques such as purse lipped breathing and diaphragmatic breathing and practicing self-pacing with activity.;Increase knowledge of respiratory medications and ability to use respiratory devices properly.;Improve shortness of breath with ADL's  Review Brett Saunders is slow to progress and uses 2L of supplemental oxygen to exercise.  He has extreme shortness of breath especially with walking.  This is the 3rd time he has been thru [facility] [employer].  He maintains, but never seems to progress.    Expected Outcomes See admission goals.             ITP Comments:   Comments:  [place] has completed 9 exercise session in Pulmonary rehab. Pt  maintains good attendance. Pulmonary rehab staff will continue to monitor and reassess progress toward goals during his participation in Pulmonary Rehab.

## 2020-10-05 ENCOUNTER — Encounter (HOSPITAL_COMMUNITY)
Admission: RE | Admit: 2020-10-05 | Discharge: 2020-10-05 | Disposition: A | Discharge status: Home / Self Care | Payer: No Typology Code available for payment source | Source: Ambulatory Visit | Attending: Cardiology | Admitting: Cardiology

## 2020-10-05 ENCOUNTER — Other Ambulatory Visit: Payer: Self-pay

## 2020-10-05 DIAGNOSIS — J449 Chronic obstructive pulmonary disease, unspecified: Secondary | ICD-10-CM | POA: Diagnosis not present

## 2020-10-05 NOTE — Progress Notes (Signed)
Daily Session Note  Patient Details  Name: Brett Saunders MRN: [id number] Date of Birth: 1945-08-01 Referring Provider:   April Manson Pulmonary Rehab Walk Test from 08/18/2020 in [place]  Referring Provider Dr. Gwenette Greet ([place])       Encounter Date: 10/05/2020  Check In:  Session Check In - 10/05/20 1153       Check-In   Supervising physician immediately available to respond to emergencies [facility] immediately available    Physician(s) Dr. Adalberto Ill    Location [facility]    Staff Present Rosebud Poles, RN, Isaac Laud, MS, ACSM-CEP, Exercise Physiologist;Lisa Clarisa Schools, MS, ACSM-CEP, Exercise Physiologist    Virtual Visit No    Medication changes reported     No    Fall or balance concerns reported    No    Tobacco Cessation No Change    Warm-up and Cool-down Performed as group-led instruction    Resistance Training Performed Yes    VAD Patient? No    PAD/SET Patient? No      Pain Assessment   Currently in Pain? No/denies    Multiple Pain Sites No             Capillary Blood Glucose: No results found for this or any previous visit (from the past 24 hour(s)).    Social History   Tobacco Use  Smoking Status Former   Packs/day: 0.10   Types: Cigarettes   Quit date: 12/2011   Years since quitting: 8.8  Smokeless Tobacco Never  Tobacco Comments   pt smokes 1 cigarrete occasionally. 1 pack "last a few months"    Goals Met:  Proper associated with RPD/PD & O2 Sat Independence with exercise equipment Exercise tolerated well No report of cardiac concerns or symptoms Strength training completed today  Goals Unmet:  Not Applicable  Comments: Service time is from 1014 to 1130    Dr. Fransico Him is Medical Director for Cardiac Rehab at [facility].

## 2020-10-10 ENCOUNTER — Other Ambulatory Visit: Payer: Self-pay

## 2020-10-10 ENCOUNTER — Encounter (HOSPITAL_COMMUNITY)
Admission: RE | Admit: 2020-10-10 | Discharge: 2020-10-10 | Disposition: A | Discharge status: Home / Self Care | Payer: No Typology Code available for payment source | Source: Ambulatory Visit | Attending: Cardiology | Admitting: Cardiology

## 2020-10-10 VITALS — Wt 121.7 lb

## 2020-10-10 DIAGNOSIS — J449 Chronic obstructive pulmonary disease, unspecified: Secondary | ICD-10-CM

## 2020-10-10 NOTE — Progress Notes (Signed)
Daily Session Note  Patient Details  Name: Brett Saunders MRN: [id number] Date of Birth: 1945-07-20 Referring Provider:   April Manson Pulmonary Rehab Walk Test from 08/18/2020 in [place]  Referring Provider Dr. Gwenette Greet ([place])       Encounter Date: 10/10/2020  Check In:  Session Check In - 10/10/20 1121       Check-In   Supervising physician immediately available to respond to emergencies [facility] immediately available    Physician(s) Dr. Sabino Gasser    [facility]    Staff Present Rosebud Poles, RN, BSN;Lisa Ysidro Evert, Cathleen Fears, MS, ACSM-CEP, Exercise Physiologist;Jessica Hassell Done, MS, ACSM-CEP, Exercise Physiologist    Virtual Visit No    Medication changes reported     No    Fall or balance concerns reported    No    Tobacco Cessation No Change    Warm-up and Cool-down Performed as group-led instruction    Resistance Training Performed Yes    VAD Patient? No    PAD/SET Patient? No      Pain Assessment   Currently in Pain? No/denies    Multiple Pain Sites No             Capillary Blood Glucose: No results found for this or any previous visit (from the past 24 hour(s)).   Exercise Prescription Changes - 10/10/20 1100       Response to Exercise   Blood Pressure (Admit) 120/60    Blood Pressure (Exercise) 124/68    Blood Pressure (Exit) 128/66    Heart Rate (Admit) 64 bpm    Heart Rate (Exercise) 94 bpm    Heart Rate (Exit) 81 bpm    Oxygen Saturation (Admit) 100 %    Oxygen Saturation (Exercise) 97 %    Oxygen Saturation (Exit) 98 %    Rating of Perceived Exertion (Exercise) 13    Perceived Dyspnea (Exercise) 3    Duration Continue with 30 min of aerobic exercise without signs/symptoms of physical distress.    Intensity THRR unchanged      Progression   Progression Continue to progress workloads to maintain intensity without signs/symptoms of physical distress.      Resistance Training    Training Prescription Yes    Weight orange bands    Reps 10-15    Time 10 Minutes      Oxygen   Oxygen Continuous    Liters 2      NuStep   Level 3    SPM 80    Minutes 15    METs 1.7      Track   Laps 8    Minutes 15    METs 1.93             Social History   Tobacco Use  Smoking Status Former   Packs/day: 0.10   Types: Cigarettes   Quit date: 12/2011   Years since quitting: 8.8  Smokeless Tobacco Never  Tobacco Comments   pt smokes 1 cigarrete occasionally. 1 pack "last a few months"    Goals Met:  Proper associated with RPD/PD & O2 Sat Exercise tolerated well No report of cardiac concerns or symptoms Strength training completed today  Goals Unmet:  Not Applicable  Comments: Service time is from 1015 to 1130    Dr. Fransico Him is Medical Director for Cardiac Rehab at [facility].

## 2020-10-10 NOTE — Progress Notes (Signed)
I have reviewed a Home Exercise Prescription with [name] . Calvert is not currently exercising at home. He states that he has to do yard work nearly every day and that is enough for him. The patient was advised to use his Nustep and perform resistance band exercises at least 2 days a week for 30 minutes.  Encouraged him to do exercise on the days that he is not doing yard work. [name] and I discussed how to progress their exercise prescription.  The patient stated that their goals were to improve shortness of breath and gain weight.  The patient stated that they understand the exercise prescription.  We reviewed exercise guidelines, target heart rate during exercise, RPE Scale, weather conditions, rescue inhaler use, endpoints for exercise, warmup and cool down.  Patient is encouraged to come to me with any questions. I will continue to follow up with the patient to assist them with progression and safety.    Rick Duff MS, ACSM CEP 12:12 PM 10/10/2020

## 2020-10-12 ENCOUNTER — Other Ambulatory Visit: Payer: Self-pay

## 2020-10-12 ENCOUNTER — Encounter (HOSPITAL_COMMUNITY)
Admission: RE | Admit: 2020-10-12 | Discharge: 2020-10-12 | Disposition: A | Discharge status: Home / Self Care | Payer: No Typology Code available for payment source | Source: Ambulatory Visit | Attending: Cardiology | Admitting: Cardiology

## 2020-10-12 DIAGNOSIS — J449 Chronic obstructive pulmonary disease, unspecified: Secondary | ICD-10-CM | POA: Diagnosis not present

## 2020-10-12 NOTE — Progress Notes (Signed)
Daily Session Note  Patient Details  Name: Brett Saunders MRN: [id number] Date of Birth: 09-11-1945 Referring Provider:   April Manson Pulmonary Rehab Walk Test from 08/18/2020 in [place]  Referring Provider Dr. Gwenette Greet ([place])       Encounter Date: 10/12/2020  Check In:  Session Check In - 10/12/20 1112       Check-In   Supervising physician immediately available to respond to emergencies [facility] immediately available    Physician(s) Dr. Karleen Hampshire    [facility]    Staff Present Rosebud Poles, RN, Quentin Ore, MS, ACSM-CEP, Exercise Physiologist;Jessica Hassell Done, MS, ACSM-CEP, Exercise Physiologist;Montarius Kitagawa Ysidro Evert, RN    Virtual Visit No    Medication changes reported     No    Fall or balance concerns reported    No    Tobacco Cessation No Change    Warm-up and Cool-down Performed as group-led instruction    Resistance Training Performed Yes    VAD Patient? No    PAD/SET Patient? No      Pain Assessment   Currently in Pain? No/denies    Multiple Pain Sites No             Capillary Blood Glucose: No results found for this or any previous visit (from the past 24 hour(s)).    Social History   Tobacco Use  Smoking Status Former   Packs/day: 0.10   Types: Cigarettes   Quit date: 12/2011   Years since quitting: 8.8  Smokeless Tobacco Never  Tobacco Comments   pt smokes 1 cigarrete occasionally. 1 pack "last a few months"    Goals Met:  Exercise tolerated well No report of cardiac concerns or symptoms Strength training completed today  Goals Unmet:  Not Applicable  Comments: Service time is from 1014 to 1135    Dr. Fransico Him is Medical Director for Cardiac Rehab at [facility].

## 2020-10-17 ENCOUNTER — Encounter (HOSPITAL_COMMUNITY)
Admission: RE | Admit: 2020-10-17 | Discharge: 2020-10-17 | Disposition: A | Discharge status: Home / Self Care | Payer: No Typology Code available for payment source | Source: Ambulatory Visit | Attending: Cardiology | Admitting: Cardiology

## 2020-10-17 ENCOUNTER — Other Ambulatory Visit: Payer: Self-pay

## 2020-10-17 DIAGNOSIS — J449 Chronic obstructive pulmonary disease, unspecified: Secondary | ICD-10-CM | POA: Diagnosis not present

## 2020-10-17 NOTE — Progress Notes (Signed)
Daily Session Note  Patient Details  Name: Brett Saunders MRN: [id number] Date of Birth: 1945-07-29 Referring Provider:   April Manson Pulmonary Rehab Walk Test from 08/18/2020 in [place]  Referring Provider Dr. Gwenette Greet ([place])       Encounter Date: 10/17/2020  Check In:  Session Check In - 10/17/20 1106       Check-In   Supervising physician immediately available to respond to emergencies [facility] immediately available    Physician(s) Dr. Broadus John    [facility]    Staff Present Rosebud Poles, RN, Quentin Ore, MS, ACSM-CEP, Exercise Physiologist;Lisa Ysidro Evert, RN    Virtual Visit No    Medication changes reported     No    Fall or balance concerns reported    No    Tobacco Cessation No Change    Warm-up and Cool-down Performed as group-led instruction    Resistance Training Performed Yes    VAD Patient? No    PAD/SET Patient? No      Pain Assessment   Currently in Pain? No/denies    Multiple Pain Sites No             Capillary Blood Glucose: No results found for this or any previous visit (from the past 24 hour(s)).    Social History   Tobacco Use  Smoking Status Former   Packs/day: 0.10   Types: Cigarettes   Quit date: 12/2011   Years since quitting: 8.8  Smokeless Tobacco Never  Tobacco Comments   pt smokes 1 cigarrete occasionally. 1 pack "last a few months"    Goals Met:  Proper associated with RPD/PD & O2 Sat Exercise tolerated well No report of cardiac concerns or symptoms Strength training completed today  Goals Unmet:  Not Applicable  Comments: Service time is from 1015 to 1123    Dr. Fransico Him is Medical Director for Cardiac Rehab at [facility].

## 2020-10-19 ENCOUNTER — Other Ambulatory Visit: Payer: Self-pay

## 2020-10-19 ENCOUNTER — Encounter (HOSPITAL_COMMUNITY)
Admission: RE | Admit: 2020-10-19 | Discharge: 2020-10-19 | Disposition: A | Discharge status: Home / Self Care | Payer: No Typology Code available for payment source | Source: Ambulatory Visit | Attending: Cardiology | Admitting: Cardiology

## 2020-10-19 DIAGNOSIS — J449 Chronic obstructive pulmonary disease, unspecified: Secondary | ICD-10-CM

## 2020-10-19 NOTE — Progress Notes (Signed)
Daily Session Note  Patient Details  Name: Brett Saunders MRN: [id number] Date of Birth: 03-Oct-1945 Referring Provider:   April Manson Pulmonary Rehab Walk Test from 08/18/2020 in [place]  Referring Provider Dr. Gwenette Greet ([place])       Encounter Date: 10/19/2020  Check In:  Session Check In - 10/19/20 1139       Check-In   Supervising physician immediately available to respond to emergencies [facility] immediately available    Physician(s) Dr. Broadus John    Location [facility]    Staff Present Rosebud Poles, RN, Milus Glazier, MS, ACSM-CEP, CCRP, Exercise Physiologist;Jakala Herford Ysidro Evert, RN    Virtual Visit No    Medication changes reported     No    Fall or balance concerns reported    No    Tobacco Cessation No Change    Warm-up and Cool-down Performed on first and last piece of equipment    Resistance Training Performed Yes    VAD Patient? No    PAD/SET Patient? No      Pain Assessment   Currently in Pain? No/denies    Multiple Pain Sites No             Capillary Blood Glucose: No results found for this or any previous visit (from the past 24 hour(s)).    Social History   Tobacco Use  Smoking Status Former   Packs/day: 0.10   Types: Cigarettes   Quit date: 12/2011   Years since quitting: 8.8  Smokeless Tobacco Never  Tobacco Comments   pt smokes 1 cigarrete occasionally. 1 pack "last a few months"    Goals Met:  Exercise tolerated well No report of cardiac concerns or symptoms Strength training completed today  Goals Unmet:  Not Applicable  Comments: Service time is from 1015 to 1128    Dr. Fransico Him is Medical Director for Cardiac Rehab at [facility].

## 2020-11-15 NOTE — Addendum Note (Signed)
Encounter addended by: George Ina, RD on: 11/15/2020 9:14 AM  Actions taken: Flowsheet data copied forward, Flowsheet accepted

## 2020-11-16 NOTE — Addendum Note (Signed)
Encounter addended by: Lance Morin, RN on: 11/16/2020 9:43 AM  Actions taken: Clinical Note Signed, Episode resolved

## 2020-11-16 NOTE — Progress Notes (Signed)
Discharge Progress Report  Patient Details  Name: Brett Saunders MRN: [id number] Date of Birth: [birth year] Referring Provider:   April Manson Pulmonary Rehab Walk Test from 08/18/2020 in [place]  Referring Provider Dr. Gwenette Greet ([place])        Number of Visits: 15  Reason for Discharge:  Patient reached a stable level of exercise. Patient independent in their exercise. Patient has met program and personal goals.  Smoking History:  Social History   Tobacco Use  Smoking Status Former   Packs/day: 0.10   Types: Cigarettes   Quit date: 12/2011   Years since quitting: 8.9  Smokeless Tobacco Never  Tobacco Comments   pt smokes 1 cigarrete occasionally. 1 pack "last a few months"    Diagnosis:  Chronic obstructive pulmonary disease, unspecified COPD type ([place])  ADL UCSD:  Pulmonary Assessment Scores     Row Name 08/18/20 1208 10/19/20 1140       ADL UCSD   ADL Phase Exit --    SOB Score total 35 --         CAT Score   CAT Score 20 --         mMRC Score   mMRC Score 3 3             Initial Exercise Prescription:  Initial Exercise Prescription - 08/18/20 1100       Date of Initial Exercise RX and Referring Provider   Date 08/18/20    Referring Provider Dr. Gwenette Greet ([place])    Expected Discharge Date 10/19/20      Oxygen   Oxygen Continuous    Liters 2      NuStep   Level 1    SPM 70    Minutes 15      Track   Minutes 15      Prescription Details   Frequency (times per week) 2    Duration Progress to 30 minutes of continuous aerobic without signs/symptoms of physical distress      Intensity   THRR 40-80% of Max Heartrate 58-115    Ratings of Perceived Exertion 11-13    Perceived Dyspnea 0-4      Progression   Progression Continue to progress workloads to maintain intensity without signs/symptoms of physical distress.      Resistance Training   Training Prescription Yes    Weight Orange bands    Reps 10-15              Discharge Exercise Prescription (Final Exercise Prescription Changes):  Exercise Prescription Changes - 10/10/20 1200       Home Exercise Plan   Plans to continue exercise at Home (comment)   Nustep and Resistance band exercises   Frequency Add 2 additional days to program exercise sessions.    Initial Home Exercises Provided 10/10/20             Functional Capacity:  6 Minute Walk     Row Name 08/18/20 1141 10/19/20 1040       6 Minute Walk   Phase Initial Discharge    Distance 872 feet 879 feet    Distance Feet Change -- 7 ft    Walk Time 6 minutes 6 minutes    # of Rest Breaks 1  1 seated rest break for 45 seconds due to leg fatigue and shortness of breath 1    MPH 1.65 1.66    METS 2.39 2.48    RPE 12 13  Perceived Dyspnea  2 3    VO2 Peak 8.38 8.69    Symptoms Yes (comment) Yes (comment)    Comments leg weakness/fatigue C/O of feeling unbalanced at times: Residual effects from his stroke. Denies dizziness.    Resting HR 82 bpm 68 bpm    Resting BP 110/50 120/72    Resting Oxygen Saturation  100 % 100 %    Exercise Oxygen Saturation  during 6 min walk 95 % 99 %    Max Ex. HR 98 bpm 96 bpm    Max Ex. BP 130/60 140/78    2 Minute Post BP 120/60 118/74         Interval HR   1 Minute HR 97 94    2 Minute HR 95 96    3 Minute HR 96 95    4 Minute HR 97 95    5 Minute HR 97 95    6 Minute HR 95 94    2 Minute Post HR 98 83    Interval Heart Rate? Yes --         Interval Oxygen   Interval Oxygen? Yes --    Baseline Oxygen Saturation % 99 % --    1 Minute Oxygen Saturation % 99 % 100 %    1 Minute Liters of Oxygen 2 L 2 L    2 Minute Oxygen Saturation % 98 % 99 %    2 Minute Liters of Oxygen 2 L 2 L    3 Minute Oxygen Saturation % 97 % 99 %    3 Minute Liters of Oxygen 2 L 2 L    4 Minute Oxygen Saturation % 97 % 99 %    4 Minute Liters of Oxygen 2 L 2 L    5 Minute Oxygen Saturation % 97 % 99 %    5 Minute Liters of Oxygen 2 L 2 L     6 Minute Oxygen Saturation % 95 % 99 %    6 Minute Liters of Oxygen 2 L 2 L    2 Minute Post Oxygen Saturation % 98 % 100 %    2 Minute Post Liters of Oxygen 2 L 2 L             Psychological, QOL, Others - Outcomes: PHQ 2/9: Depression screen [facility] 2/9 10/19/2020 08/18/2020 02/22/2019 03/13/2018 03/13/2018  Decreased Interest 0 0 0 0 0  Down, Depressed, Hopeless 0 0 0 0 0  PHQ - 2 Score 0 0 0 0 0  Altered sleeping 2 0 1 0 0  Tired, decreased energy 1 2 0 3 3  Change in appetite 0 2 0 3 3  Feeling bad or failure about yourself  0 0 0 0 0  Trouble concentrating 0 0 0 0 0  Moving slowly or fidgety/restless 0 0 0 0 0  Suicidal thoughts 0 0 0 0 0  PHQ-9 Score 3 - - 6 6  Difficult doing work/chores Somewhat difficult Not difficult at all Not difficult at all Somewhat difficult Somewhat difficult    Quality of Life:   Personal Goals: Goals established at orientation with interventions provided to work toward goal.  Personal Goals and Risk Factors at Admission - 08/18/20 1152       Core Components/Risk Factors/Patient Goals on Admission    Weight Management Weight Maintenance    Improve shortness of breath with ADL's Yes    Intervention Provide education, individualized exercise plan and daily activity instruction to  help decrease symptoms of SOB with activities of daily living.    Expected Outcomes Short Term: Improve cardiorespiratory fitness to achieve a reduction of symptoms when performing ADLs;Long Term: Be able to perform more ADLs without symptoms or delay the onset of symptoms              Personal Goals Discharge:  Goals and Risk Factor Review     Row Name 09/04/20 1436 10/02/20 1151           Core Components/Risk Factors/Patient Goals Review   Personal Goals Review Develop more efficient breathing techniques such as purse lipped breathing and diaphragmatic breathing and practicing self-pacing with activity.;Increase knowledge of respiratory medications and  ability to use respiratory devices properly.;Improve shortness of breath with ADL's Develop more efficient breathing techniques such as purse lipped breathing and diaphragmatic breathing and practicing self-pacing with activity.;Increase knowledge of respiratory medications and ability to use respiratory devices properly.;Improve shortness of breath with ADL's      Review [name] has been thru pulmonary rehab 2 times previous, he just started program, has attended 3 exercise sessions, too early to see progression towards goals. [name] is slow to progress and uses 2L of supplemental oxygen to exercise.  He has extreme shortness of breath especially with walking.  This is the 3rd time he has been thru [employer].  He maintains, but never seems to progress.      Expected Outcomes See admission goals. See admission goals.               Exercise Goals and Review:  Exercise Goals     Row Name 08/18/20 1148             Exercise Goals   Increase Physical Activity Yes       Intervention Provide advice, education, support and counseling about physical activity/exercise needs.;Develop an individualized exercise prescription for aerobic and resistive training based on initial evaluation findings, risk stratification, comorbidities and participant's personal goals.       Expected Outcomes Short Term: Attend rehab on a regular basis to increase amount of physical activity.;Long Term: Add in home exercise to make exercise part of routine and to increase amount of physical activity.;Long Term: Exercising regularly at least 3-5 days a week.       Increase Strength and Stamina Yes       Intervention Provide advice, education, support and counseling about physical activity/exercise needs.;Develop an individualized exercise prescription for aerobic and resistive training based on initial evaluation findings, risk stratification, comorbidities and participant's personal goals.       Expected Outcomes Short  Term: Increase workloads from initial exercise prescription for resistance, speed, and METs.;Short Term: Perform resistance training exercises routinely during rehab and add in resistance training at home;Long Term: Improve cardiorespiratory fitness, muscular endurance and strength as measured by increased METs and functional capacity (6MWT)       Able to understand and use rate of perceived exertion (RPE) scale Yes       Intervention Provide education and explanation on how to use RPE scale       Expected Outcomes Short Term: Able to use RPE daily in rehab to express subjective intensity level;Long Term:  Able to use RPE to guide intensity level when exercising independently       Able to understand and use Dyspnea scale Yes       Intervention Provide education and explanation on how to use Dyspnea scale       Expected Outcomes  Short Term: Able to use Dyspnea scale daily in rehab to express subjective sense of shortness of breath during exertion;Long Term: Able to use Dyspnea scale to guide intensity level when exercising independently       Knowledge and understanding of Target Heart Rate Range (THRR) Yes       Intervention Provide education and explanation of THRR including how the numbers were predicted and where they are located for reference       Expected Outcomes Short Term: Able to state/look up THRR;Long Term: Able to use THRR to govern intensity when exercising independently;Short Term: Able to use daily as guideline for intensity in rehab       Understanding of Exercise Prescription Yes       Intervention Provide education, explanation, and written materials on patient's individual exercise prescription       Expected Outcomes Short Term: Able to explain program exercise prescription;Long Term: Able to explain home exercise prescription to exercise independently                Exercise Goals Re-Evaluation:  Exercise Goals Re-Evaluation     Row Name 09/04/20 2878 10/02/20 1408            Exercise Goal Re-Evaluation   Exercise Goals Review Increase Physical Activity;Increase Strength and Stamina;Able to understand and use rate of perceived exertion (RPE) scale;Able to understand and use Dyspnea scale;Knowledge and understanding of Target Heart Rate Range (THRR);Understanding of Exercise Prescription Increase Physical Activity;Increase Strength and Stamina;Able to understand and use rate of perceived exertion (RPE) scale;Able to understand and use Dyspnea scale;Knowledge and understanding of Target Heart Rate Range (THRR);Understanding of Exercise Prescription      Comments Pt has completed 3 exercise sessions and has tolerated well so far. He has been through the program before and is familiar with the routine. He has already increased his workload and increased METS in the short time he has been here. He is exercising at 2.1 METS on the Nustep. Will continue to monitor and progress as he is able. Kevork has completed 9 exercise sessions and has been consistent with workload increases on the Nustep but has not made much progression with increasing MET level. We also have him walking the track now, but he does not "want" to do this on a regular basis. Basically whenever he "feels like it" he walks the track, which has been 4 out of 9 sessions. He is currently exercising at 1.8 METS on the Nustep and 1.93 METS on the track. Will continue to monitor and progress as he is able.      Expected Outcomes Through exercise at rehab and at home, the patient will decrease shortness of breath with daily activities and feel confident in carrying out an exercise regime at home.  Through exercise at rehab and at home, the patient will decrease shortness of breath with daily activities and feel confident in carrying out an exercise regime at home.                Nutrition & Weight - Outcomes:  Pre Biometrics - 08/18/20 1149       Pre Biometrics   Grip Strength 29 kg               Nutrition:  Nutrition Therapy & Goals - 08/30/20 1436       Nutrition Therapy   Diet General Healthful (high calorie)    Drug/Food Interactions Statins/Certain Fruits      Personal Nutrition Goals  Nutrition Goal Pt to identify food quantities necessary to achieve weight gain 0.5 lbs per week lb at graduation from pulmonary  rehab.    Personal Goal #2 Pt to incorporate nutrient dense and high calorie foods into his 5-6 mini meals per day    Personal Goal #3 Pt to meet exercise guidelines of 30 minutes 3-5 days per week.      Intervention Plan   Intervention Prescribe, educate and counsel regarding individualized specific dietary modifications aiming towards targeted core components such as weight, hypertension, lipid management, diabetes, heart failure and other comorbidities.;Nutrition handout(s) given to patient.    Expected Outcomes Short Term Goal: A plan has been developed with personal nutrition goals set during dietitian appointment.;Long Term Goal: Adherence to prescribed nutrition plan.             Nutrition Discharge:   Education Questionnaire Score:  Knowledge Questionnaire Score - 08/18/20 1204       Knowledge Questionnaire Score   Pre Score 14/18             Goals reviewed with patient; copy given to patient.

## 2021-04-08 IMAGING — CT CT T SPINE W/O CM
3 of 4 series · 9 of 33 positions shown, 11 images · non-contrast
Comparison: None.

CLINICAL DATA: Pain. History of T10-T12 compression fracture.
12/06/2017

EXAM:
CT THORACIC SPINE WITHOUT CONTRAST
TECHNIQUE: Multidetector CT images of the thoracic were obtained using the
standard protocol without intravenous contrast.

[Series 4: t-spine 2.0 (person_name) (person_name) · axial · 0.42mm/px · z∈[+1114,+1114]mm · 1 of 185 slices shown, 2 images]
[im 93/185  soft-tissue]
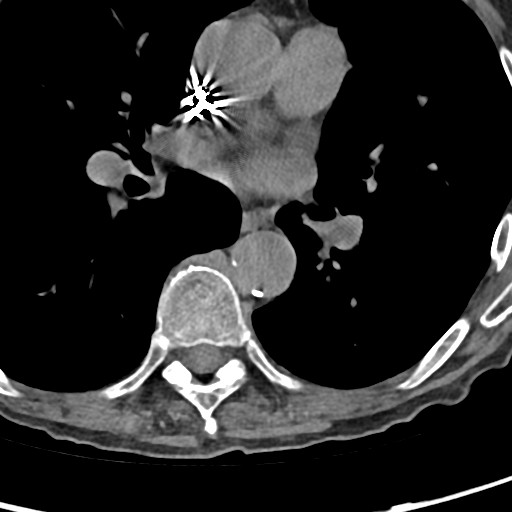
[im 93/185  bone]
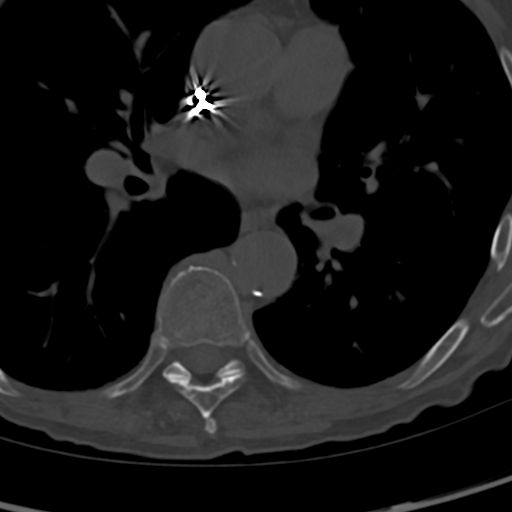

[Series 10: t-spine 2.0 cor bone · coronal · 0.29mm/px · 3 of 100 slices shown]
[im 20/100  bone]
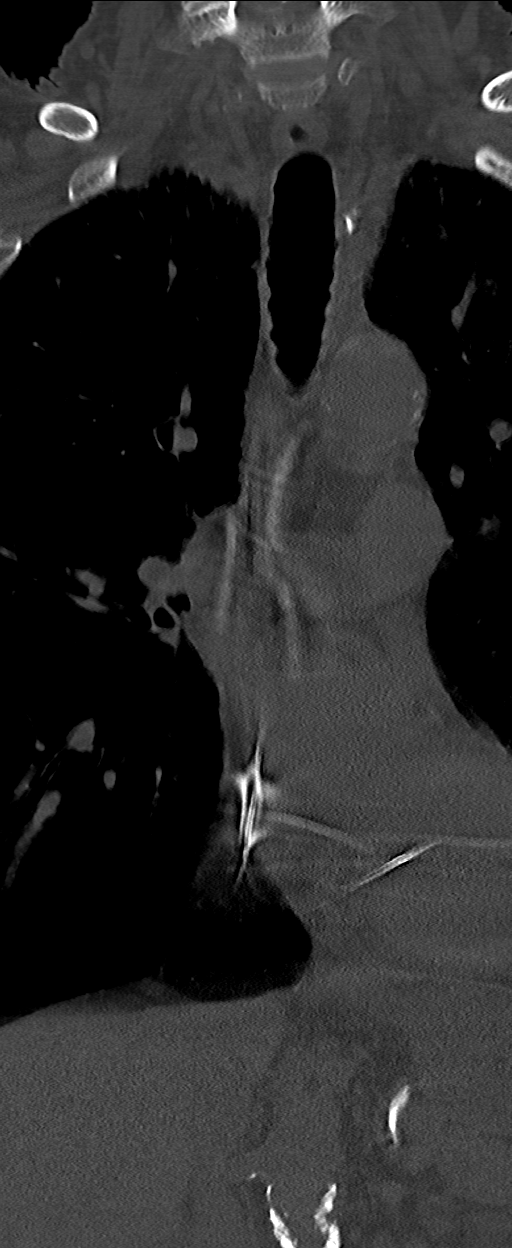
[im 40/100  bone]
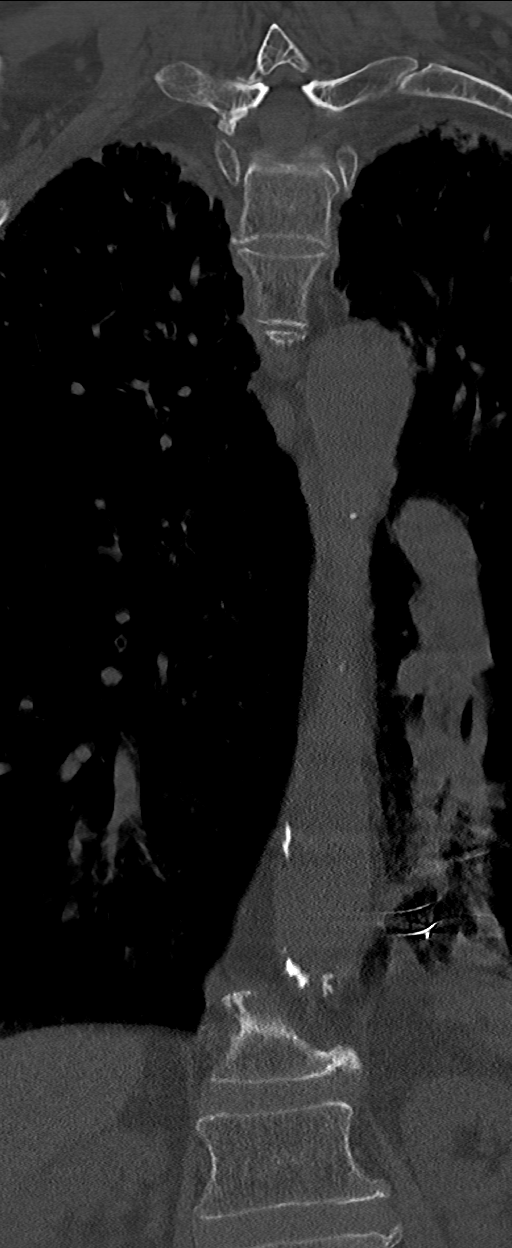
[im 60/100  bone]
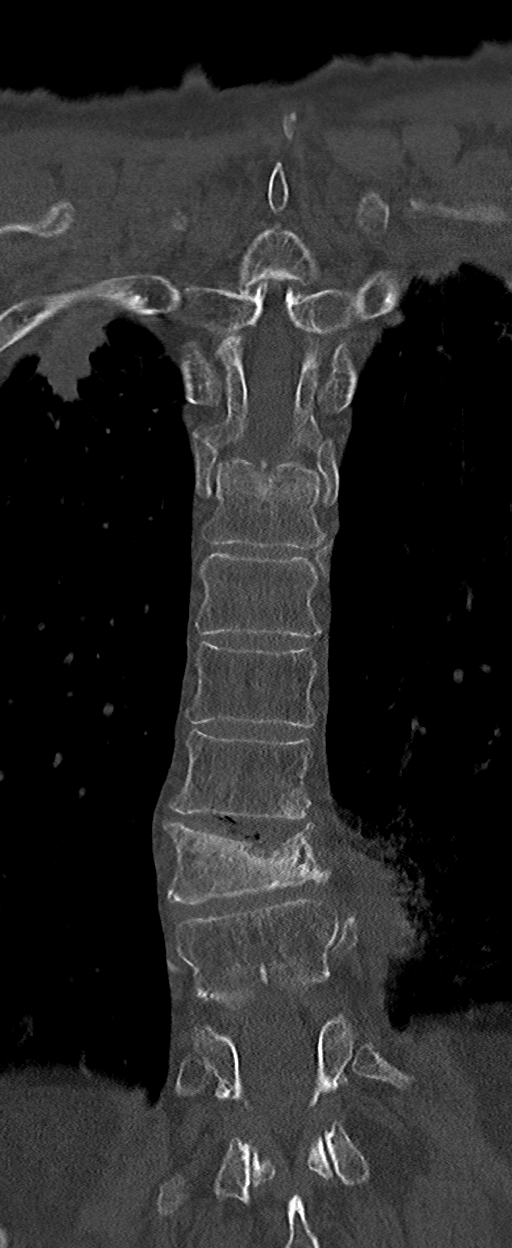

[Series 11: t-spine 2.0 sag bone · sagittal · 0.44mm/px · 5 of 66 slices shown, 6 images]
[im 22/66  bone]
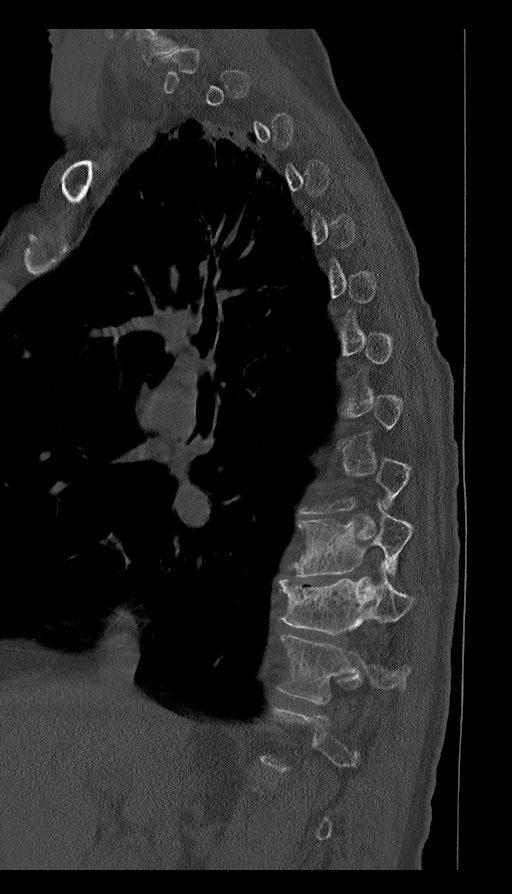
[im 28/66  bone]
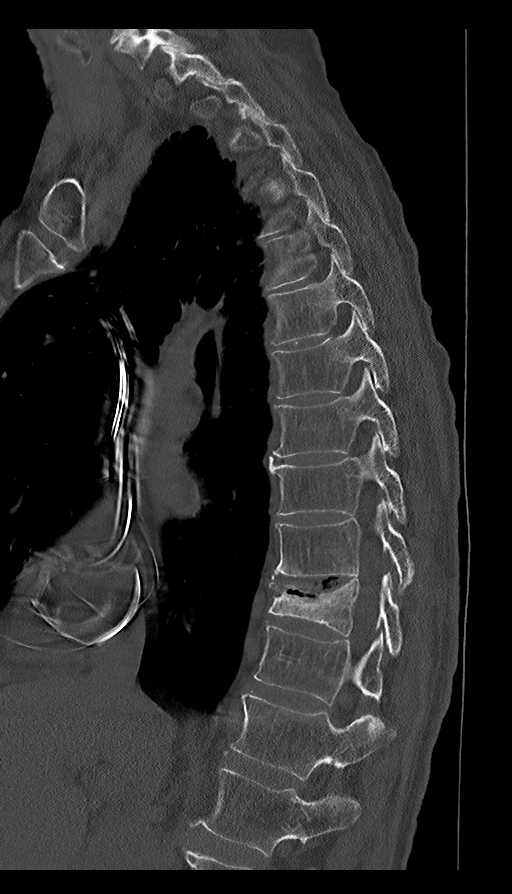
[im 33/66  soft-tissue]
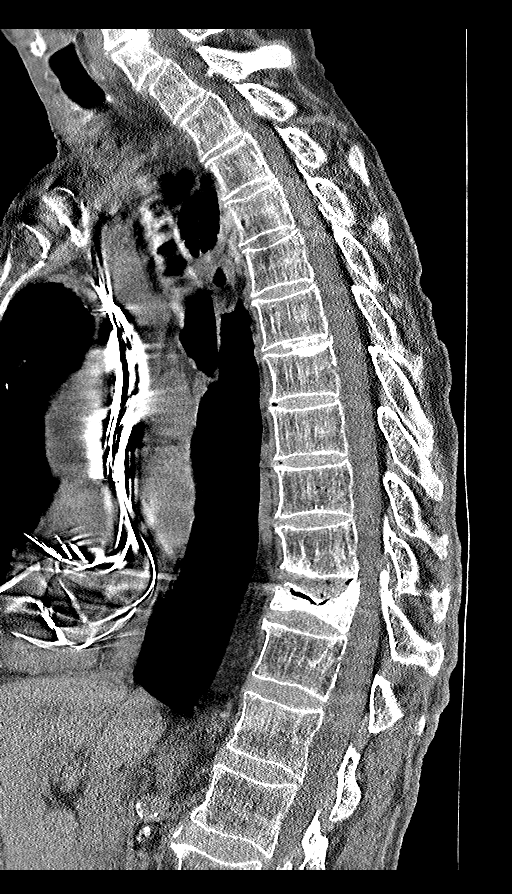
[im 33/66  bone]
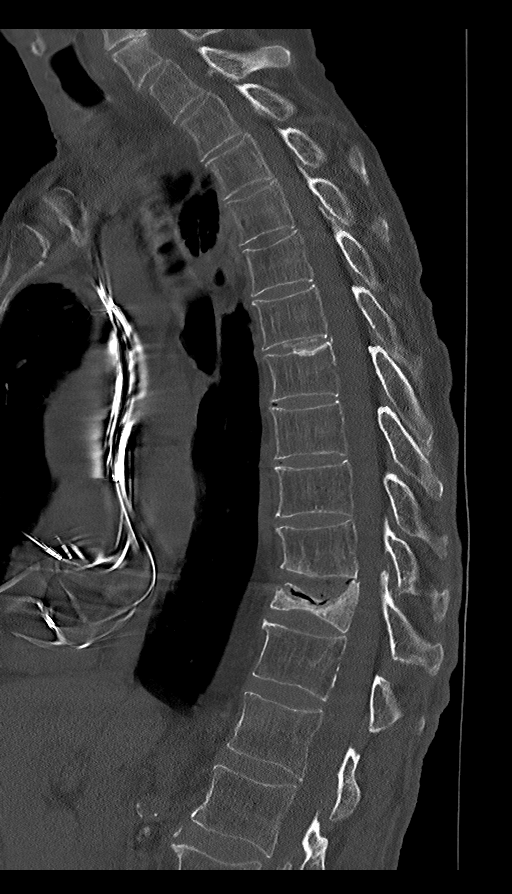
[im 38/66  bone]
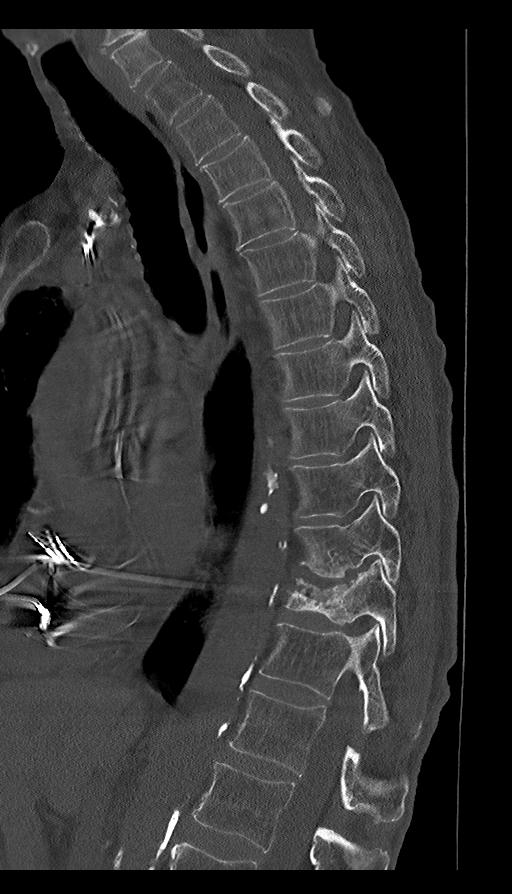
[im 44/66  bone]
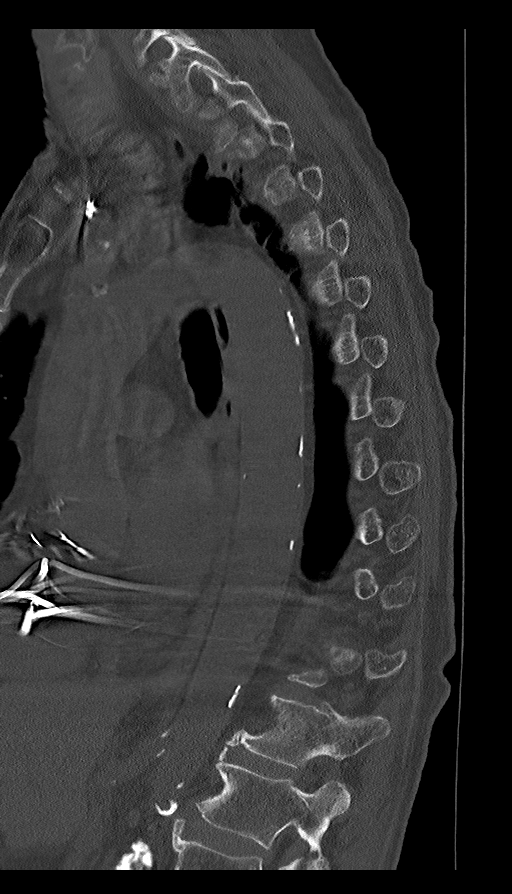

[9 of 33 positions shown; findings below may reference images not displayed]

FINDINGS: Alignment: Normal.

Vertebrae: There is a moderate to severe compression fracture of the
T11 vertebral body with approximately. This has significantly
progressed since the prior study.

Paraspinal and other soft tissues: There are atherosclerotic changes
of the visualized thoracoabdominal aorta. The visualized portions of
the heart appear normal with respect to size. Advanced coronary
artery calcifications are noted. The visualized portions of both
kidneys are unremarkable. The visualized portions of the liver are
unremarkable. The spleen is unremarkable. The visualized portions of
the pancreas are grossly unremarkable. Severe emphysematous changes
are again noted. There is chronic atelectasis involving the
posterior upper lobes bilaterally, right worse than left. There is
consolidation involving the left lower lobe which has improved since
prior study. There is a trace left-sided pleural effusion. There is
near complete occlusion of the left lower lobe bronchus with
probable endobronchial debris. Fiducial markers are noted in the
left lower lobe.

Disc levels: The disc heights are relatively well preserved
throughout the thoracic spine.
IMPRESSION: 1. Moderate to severe compression fracture of the T11 vertebral body
as detailed above. This has significantly progressed since the
patient's CT in [DATE]. Severe emphysematous changes bilaterally.
3. Trace left-sided pleural effusion.
4. Persistent consolidation at the left lung base. This is felt to
be in part due to obstruction of the left lower lobe bronchus,
perhaps by debris. Consider further endobronchial evaluation as
clinically indicated.
5. Additional chronic findings as detailed above.

Aortic Atherosclerosis ([id number]).

## 2021-09-03 ENCOUNTER — Other Ambulatory Visit: Payer: Self-pay | Admitting: *Deleted

## 2021-09-03 DIAGNOSIS — I739 Peripheral vascular disease, unspecified: Secondary | ICD-10-CM

## 2021-09-10 ENCOUNTER — Ambulatory Visit (HOSPITAL_COMMUNITY)
Admission: RE | Admit: 2021-09-10 | Discharge: 2021-09-10 | Disposition: A | Discharge status: Home / Self Care | Payer: No Typology Code available for payment source | Source: Ambulatory Visit | Attending: Surgical Oncology | Admitting: Surgical Oncology

## 2021-09-10 DIAGNOSIS — I739 Peripheral vascular disease, unspecified: Secondary | ICD-10-CM | POA: Diagnosis present

## 2021-09-11 ENCOUNTER — Other Ambulatory Visit: Payer: Self-pay

## 2021-09-11 ENCOUNTER — Encounter: Payer: Self-pay | Admitting: Vascular Surgery

## 2021-09-11 ENCOUNTER — Ambulatory Visit (INDEPENDENT_AMBULATORY_CARE_PROVIDER_SITE_OTHER): Payer: No Typology Code available for payment source | Admitting: Vascular Surgery

## 2021-09-11 DIAGNOSIS — I70222 Atherosclerosis of native arteries of extremities with rest pain, left leg: Secondary | ICD-10-CM | POA: Diagnosis not present

## 2021-09-11 DIAGNOSIS — I70223 Atherosclerosis of native arteries of extremities with rest pain, bilateral legs: Secondary | ICD-10-CM

## 2021-09-11 NOTE — Progress Notes (Signed)
Patient name: Brett Saunders MRN: [id number] DOB: 1946/02/07 Sex: male  REASON FOR CONSULT: Critical limb ischemia with rest pain in the left foot  HPI: Brett Saunders is a 76 y.o. male, with history of atrial fibrillation on Eliquis, COPD, lung cancer, tobacco abuse that presents for evaluation of critical limb ischemia in the left lower extremity with rest pain.  Patient describes at least 10 years of pain in his left foot.  He has numbness associated with this and often cannot sleep at night.  He has been evaluated at the [facility] by Dr. Everardo Beals and then was sent here for further evaluation.  His studies at the [place] showed an ABI of 0.6 on the right and 0.55 on the left.  Evidence of moderate peripheral vascular disease.  He states no previous interventions.  Still smoking several cigarettes a day.  No open wounds at this time.  ABIs yesterday were 0.75 on the right and 0.71 on the left with evidence of moderate arterial disease  Past Medical History:  Diagnosis Date   Aortic anomaly    aortic calcification   Arrhythmia    a fib   Atrial fibrillation (HCC)    CAD (coronary artery disease)    CHF (congestive heart failure) (HCC)    Chronic obstructive lung disease (HCC)    COPD (chronic obstructive pulmonary disease) (HCC)    Diverticulosis 2010   colonoscopy 08-2008   DVT (deep venous thrombosis) (HCC)    GERD (gastroesophageal reflux disease)    Hemorrhoids    History of atrial fibrillation    Hypertension    Ischemic cardiomyopathy    Lung cancer (HCC)    lung   On home oxygen therapy    Peripheral vascular disease (HCC)    Stroke (HCC)    oct 2012    Past Surgical History:  Procedure Laterality Date   PACEMAKER PLACEMENT     and defibrillator    History reviewed. No pertinent family history.  SOCIAL HISTORY: Social History   Socioeconomic History   Marital status: Married    Spouse name: Not on file   Number of children: Not on file   Years of education: Not on file    Highest education level: Not on file  Occupational History   Occupation: retired  Tobacco Use   Smoking status: Former    Packs/day: 0.10    Types: Cigarettes    Quit date: 12/2011    Years since quitting: 9.7   Smokeless tobacco: Never   Tobacco comments:    pt smokes 1 cigarrete occasionally. 1 pack "last a few months"  Vaping Use   Vaping Use: Every day   Start date: 04/14/2011   Substances: Nicotine  Substance and Sexual Activity   Alcohol use: Yes    Comment: occasional beer   Drug use: Never   Sexual activity: Not on file  Other Topics Concern   Not on file  Social History Narrative   Not on file   Social Determinants of Health   Financial Resource Strain: Not on file  Food Insecurity: Not on file  Transportation Needs: Not on file  Physical Activity: Not on file  Stress: Not on file  Social Connections: Not on file  Intimate Partner Violence: Not on file    Allergies  Allergen Reactions   Amoxicillin Shortness Of Breath    Has patient had a PCN reaction causing immediate rash, facial/tongue/throat swelling, SOB or lightheadedness with hypotension: Yes Has patient had a PCN reaction  causing severe rash involving mucus membranes or skin necrosis: Yes Has patient had a PCN reaction that required hospitalization: Yes Has patient had a PCN reaction occurring within the last 10 years: No If all of the above answers are "NO", then may proceed with Cephalosporin use.   Avelox [Moxifloxacin Hcl] Other (See Comments)    confusion    Current Outpatient Medications  Medication Sig Dispense Refill   acetaminophen (TYLENOL) 500 MG tablet Take 500 mg by mouth 2 (two) times daily.     albuterol (PROVENTIL) (2.5 MG/3ML) 0.083% nebulizer solution Take 2.5 mg by nebulization every 6 (six) hours as needed for wheezing or shortness of breath.     albuterol (VENTOLIN HFA) 108 (90 Base) MCG/ACT inhaler Inhale into the lungs as needed for wheezing or shortness of breath.      amiodarone (PACERONE) 200 MG tablet Take 2 tablets (400 mg total) by mouth 2 (two) times daily. 2 tab twice daily for 2 weeks, 1 tablet twice daily for two weeks then one tablet daily (Patient taking differently: Take 200 mg by mouth 2 (two) times daily. 2 tab twice daily for 2 weeks, 1 tablet twice daily for two weeks then one tablet daily) 90 tablet 1   apixaban (ELIQUIS) 5 MG TABS tablet Take 2.5 mg by mouth 2 (two) times daily.     furosemide (LASIX) 20 MG tablet Take 20 mg by mouth daily.      metoprolol succinate (TOPROL-XL) 25 MG 24 hr tablet Take 12.5 mg by mouth daily.      Mometasone Furoate 200 MCG/ACT AERO Inhale 2 puffs into the lungs at bedtime.     OLODATEROL HCL IN Inhale 2.5 mg into the lungs every morning. 2 puffs     omeprazole (PRILOSEC) 20 MG capsule Take 20 mg by mouth daily before breakfast.     OXYGEN Inhale 2 L/min into the lungs continuous.     pravastatin (PRAVACHOL) 40 MG tablet Take 40 mg by mouth at bedtime.     rOPINIRole (REQUIP) 0.25 MG tablet Take 0.25 mg by mouth 2 (two) times daily.     sodium chloride 1 g tablet Take 1 g by mouth 3 (three) times daily.     diphenhydrAMINE (BENADRYL) 25 MG tablet Take 25 mg by mouth at bedtime. (Patient not taking: Reported on 08/18/2020)     No current facility-administered medications for this visit.    REVIEW OF SYSTEMS:  [X]  denotes positive finding, [ ]  denotes negative finding Cardiac  Comments:  Chest pain or chest pressure:    Shortness of breath upon exertion:    Short of breath when lying flat:    Irregular heart rhythm:        Vascular    Pain in calf, thigh, or hip brought on by ambulation:    Pain in feet at night that wakes you up from your sleep:  x Left foot  Blood clot in your veins:    Leg swelling:         Pulmonary    Oxygen at home:    Productive cough:     Wheezing:         Neurologic    Sudden weakness in arms or legs:     Sudden numbness in arms or legs:     Sudden onset of difficulty  speaking or slurred speech:    Temporary loss of vision in one eye:     Problems with dizziness:  Gastrointestinal    Blood in stool:     Vomited blood:         Genitourinary    Burning when urinating:     Blood in urine:        Psychiatric    Major depression:         Hematologic    Bleeding problems:    Problems with blood clotting too easily:        Skin    Rashes or ulcers:        Constitutional    Fever or chills:      PHYSICAL EXAM: Vitals:   09/11/21 0918  BP: (!) 146/84  Pulse: 77  Resp: 16  Temp: 98.1 F (36.7 C)  TempSrc: Temporal  SpO2: 94%  Weight: 120 lb (54.4 kg)  Height: 5\' 10"  (1.778 m)    GENERAL: The patient is a well-nourished male, in no acute distress. The vital signs are documented above. CARDIAC: There is a regular rate and rhythm.  VASCULAR:  Bilateral femoral pulses palpable No palpable pedal pulses PULMONARY: No respiratory distress. ABDOMEN: Soft and non-tender. MUSCULOSKELETAL: There are no major deformities or cyanosis. NEUROLOGIC: No focal weakness or paresthesias are detected. SKIN: There are no ulcers or rashes noted. PSYCHIATRIC: The patient has a normal affect.  DATA:   ABIs yesterday were 0.75 on the right and 0.71 on the left with evidence of moderate arterial disease  Assessment/Plan:  75 year old male history of atrial fibrillation on Eliquis, COPD, lung cancer, tobacco abuse that presents for evaluation of critical limb ischemia in the left lower extremity with rest pain.  He describes at least 10 years of rest pain.  His ABIs were severely depressed at the [place].  I have recommended aortogram with lower extremity arteriogram with a focus on the left leg through a transfemoral approach.  Discussed we will evaluate for any endovascular options in the left lower extremity.  May ultimately require open surgical bypass as I discussed with him.  Risk benefits discussed.  I offered to schedule this as early as Thursday and  he will need to hold his Eliquis 48 hours.  He is going to check with his calendar and then our office will contact to schedule.   Cephus Shelling, MD [facility] [place] Office: [phone]

## 2021-09-13 ENCOUNTER — Ambulatory Visit (HOSPITAL_COMMUNITY)
Admission: RE | Admit: 2021-09-13 | Discharge: 2021-09-13 | Disposition: A | Discharge status: Home / Self Care | Payer: No Typology Code available for payment source | Source: Ambulatory Visit | Attending: Vascular Surgery | Admitting: Vascular Surgery

## 2021-09-13 ENCOUNTER — Encounter (HOSPITAL_COMMUNITY)
Admission: RE | Disposition: A | Discharge status: Home / Self Care | Payer: Self-pay | Source: Ambulatory Visit | Attending: Vascular Surgery

## 2021-09-13 ENCOUNTER — Other Ambulatory Visit: Payer: Self-pay

## 2021-09-13 DIAGNOSIS — I70222 Atherosclerosis of native arteries of extremities with rest pain, left leg: Secondary | ICD-10-CM

## 2021-09-13 DIAGNOSIS — Z7901 Long term (current) use of anticoagulants: Secondary | ICD-10-CM | POA: Insufficient documentation

## 2021-09-13 DIAGNOSIS — Z85118 Personal history of other malignant neoplasm of bronchus and lung: Secondary | ICD-10-CM | POA: Diagnosis not present

## 2021-09-13 DIAGNOSIS — F17210 Nicotine dependence, cigarettes, uncomplicated: Secondary | ICD-10-CM | POA: Insufficient documentation

## 2021-09-13 DIAGNOSIS — I4891 Unspecified atrial fibrillation: Secondary | ICD-10-CM | POA: Diagnosis not present

## 2021-09-13 DIAGNOSIS — J449 Chronic obstructive pulmonary disease, unspecified: Secondary | ICD-10-CM | POA: Insufficient documentation

## 2021-09-13 DIAGNOSIS — I70223 Atherosclerosis of native arteries of extremities with rest pain, bilateral legs: Secondary | ICD-10-CM

## 2021-09-13 HISTORY — PX: ABDOMINAL AORTOGRAM W/LOWER EXTREMITY: CATH118223

## 2021-09-13 LAB — POCT I-STAT, CHEM 8
BUN: 12 mg/dL (ref 8–23)
Calcium, Ion: 1.23 mmol/L (ref 1.15–1.40)
Chloride: 93 mmol/L — ABNORMAL LOW (ref 98–111)
Creatinine, Ser: 0.9 mg/dL (ref 0.61–1.24)
Glucose, Bld: 91 mg/dL (ref 70–99)
HCT: 39 % (ref 39.0–52.0)
Hemoglobin: 13.3 g/dL (ref 13.0–17.0)
Potassium: 4.4 mmol/L (ref 3.5–5.1)
Sodium: 132 mmol/L — ABNORMAL LOW (ref 135–145)
TCO2: 32 mmol/L (ref 22–32)

## 2021-09-13 SURGERY — ABDOMINAL AORTOGRAM W/LOWER EXTREMITY
Anesthesia: LOCAL

## 2021-09-13 MED ORDER — MIDAZOLAM HCL 2 MG/2ML IJ SOLN
INTRAMUSCULAR | Status: DC | PRN
  Administered 2021-09-13: 1 mg via INTRAVENOUS

## 2021-09-13 MED ORDER — HEPARIN (PORCINE) IN NACL 1000-0.9 UT/500ML-% IV SOLN
INTRAVENOUS | Status: DC | PRN
  Administered 2021-09-13 (×2): 500 mL

## 2021-09-13 MED ORDER — LIDOCAINE HCL (PF) 1 % IJ SOLN
INTRAMUSCULAR | Status: DC | PRN
  Administered 2021-09-13: 20 mL via INTRADERMAL

## 2021-09-13 MED ORDER — MIDAZOLAM HCL 2 MG/2ML IJ SOLN
INTRAMUSCULAR | Status: AC
  Filled 2021-09-13: qty 2

## 2021-09-13 MED ORDER — FENTANYL CITRATE (PF) 100 MCG/2ML IJ SOLN
INTRAMUSCULAR | Status: AC
  Filled 2021-09-13: qty 2

## 2021-09-13 MED ORDER — FENTANYL CITRATE (PF) 100 MCG/2ML IJ SOLN
INTRAMUSCULAR | Status: DC | PRN
  Administered 2021-09-13: 25 ug via INTRAVENOUS

## 2021-09-13 MED ORDER — LIDOCAINE HCL (PF) 1 % IJ SOLN
INTRAMUSCULAR | Status: AC
  Filled 2021-09-13: qty 30

## 2021-09-13 MED ORDER — IODIXANOL 320 MG/ML IV SOLN
INTRAVENOUS | Status: DC | PRN
  Administered 2021-09-13: 127 mL via INTRA_ARTERIAL

## 2021-09-13 MED ORDER — SODIUM CHLORIDE 0.9 % IV SOLN: INTRAVENOUS | Status: DC

## 2021-09-13 MED ORDER — HEPARIN (PORCINE) IN NACL 1000-0.9 UT/500ML-% IV SOLN
INTRAVENOUS | Status: AC
  Filled 2021-09-13: qty 1000

## 2021-09-13 SURGICAL SUPPLY — 9 items
CATH OMNI FLUSH 5F 65CM (CATHETERS) ×1 IMPLANT
KIT MICROPUNCTURE NIT STIFF (SHEATH) ×1 IMPLANT
KIT PV (KITS) ×2 IMPLANT
SHEATH PINNACLE 5F 10CM (SHEATH) ×1 IMPLANT
SHEATH PROBE COVER 6X72 (BAG) ×1 IMPLANT
SYR MEDRAD MARK V 150ML (SYRINGE) ×1 IMPLANT
TRANSDUCER W/STOPCOCK (MISCELLANEOUS) ×2 IMPLANT
TRAY PV CATH (CUSTOM PROCEDURE TRAY) ×2 IMPLANT
WIRE BENTSON .035X145CM (WIRE) ×1 IMPLANT

## 2021-09-13 NOTE — Progress Notes (Signed)
Patient arrived in the holding area at 19. Patient hemodynamically stable with dopplar right PT pulse. Sheath pulled at 1005. Pressure held for 20 min. Patient was hemodynamically stable throughout the time of holding pressure and no hematoma was present. Bed rest started 1030 for 4 hours.

## 2021-09-13 NOTE — H&P (Signed)
History and Physical Interval Note:  09/13/2021 8:36 AM  Brett Saunders  has presented today for surgery, with the diagnosis of ischemia w/ rest pain.  The various methods of treatment have been discussed with the patient and family. After consideration of risks, benefits and other options for treatment, the patient has consented to  Procedure(s): ABDOMINAL AORTOGRAM W/LOWER EXTREMITY (N/A) as a surgical intervention.  The patient's history has been reviewed, patient examined, no change in status, stable for surgery.  I have reviewed the patient's chart and labs.  Questions were answered to the patient's satisfaction.    CLI left lower extremity with rest pain  Brett Saunders  Patient name: Brett Saunders  MRN: [id number]        DOB: 13-Mar-1946          Sex: male   REASON FOR CONSULT: Critical limb ischemia with rest pain in the left foot   HPI: Brett Saunders is a 76 y.o. male, with history of atrial fibrillation on Eliquis, COPD, lung cancer, tobacco abuse that presents for evaluation of critical limb ischemia in the left lower extremity with rest pain.  Patient describes at least 10 years of pain in his left foot.  He has numbness associated with this and often cannot sleep at night.  He has been evaluated at the [facility] by Dr. Nicola Saunders and then was sent here for further evaluation.  His studies at the [place] showed an ABI of 0.6 on the right and 0.55 on the left.  Evidence of moderate peripheral vascular disease.  He states no previous interventions.  Still smoking several cigarettes a day.  No open wounds at this time.   ABIs yesterday were 0.75 on the right and 0.71 on the left with evidence of moderate arterial disease       Past Medical History:  Diagnosis Date   Aortic anomaly      aortic calcification   Arrhythmia      a fib   Atrial fibrillation (HCC)     CAD (coronary artery disease)     CHF (congestive heart failure) (HCC)     Chronic obstructive lung disease (HCC)     COPD  (chronic obstructive pulmonary disease) ([place])     Diverticulosis 2010    colonoscopy 08-2008   DVT (deep venous thrombosis) (HCC)     GERD (gastroesophageal reflux disease)     Hemorrhoids     History of atrial fibrillation     Hypertension     Ischemic cardiomyopathy     Lung cancer ([place])      lung   On home oxygen therapy     Peripheral vascular disease ([place])     Stroke ([place])      oct 2012           Past Surgical History:  Procedure Laterality Date   PACEMAKER PLACEMENT        and defibrillator      History reviewed. No pertinent family history.   SOCIAL HISTORY: Social History         Socioeconomic History   Marital status: Married      Spouse name: Not on file   Number of children: Not on file   Years of education: Not on file   Highest education level: Not on file  Occupational History   Occupation: retired  Tobacco Use   Smoking status: Former      Packs/day: 0.10      Types: Cigarettes  Quit date: 12/2011      Years since quitting: 9.7   Smokeless tobacco: Never   Tobacco comments:      pt smokes 1 cigarrete occasionally. 1 pack "last a few months"  Vaping Use   Vaping Use: Every day   Start date: 04/14/2011   Substances: Nicotine  Substance and Sexual Activity   Alcohol use: Yes      Comment: occasional beer   Drug use: Never   Sexual activity: Not on file  Other Topics Concern   Not on file  Social History Narrative   Not on file    Social Determinants of Health    Financial Resource Strain: Not on file  Food Insecurity: Not on file  Transportation Needs: Not on file  Physical Activity: Not on file  Stress: Not on file  Social Connections: Not on file  Intimate Partner Violence: Not on file           Allergies  Allergen Reactions   Amoxicillin Shortness Of Breath      Has patient had a PCN reaction causing immediate rash, facial/tongue/throat swelling, SOB or lightheadedness with hypotension: Yes Has patient had a PCN reaction  causing severe rash involving mucus membranes or skin necrosis: Yes Has patient had a PCN reaction that required hospitalization: Yes Has patient had a PCN reaction occurring within the last 10 years: No If all of the above answers are "NO", then may proceed with Cephalosporin use.   Avelox [Moxifloxacin Hcl] Other (See Comments)      confusion            Current Outpatient Medications  Medication Sig Dispense Refill   acetaminophen (TYLENOL) 500 MG tablet Take 500 mg by mouth 2 (two) times daily.       albuterol (PROVENTIL) (2.5 MG/3ML) 0.083% nebulizer solution Take 2.5 mg by nebulization every 6 (six) hours as needed for wheezing or shortness of breath.       albuterol (VENTOLIN HFA) 108 (90 Base) MCG/ACT inhaler Inhale into the lungs as needed for wheezing or shortness of breath.       amiodarone (PACERONE) 200 MG tablet Take 2 tablets (400 mg total) by mouth 2 (two) times daily. 2 tab twice daily for 2 weeks, 1 tablet twice daily for two weeks then one tablet daily (Patient taking differently: Take 200 mg by mouth 2 (two) times daily. 2 tab twice daily for 2 weeks, 1 tablet twice daily for two weeks then one tablet daily) 90 tablet 1   apixaban (ELIQUIS) 5 MG TABS tablet Take 2.5 mg by mouth 2 (two) times daily.       furosemide (LASIX) 20 MG tablet Take 20 mg by mouth daily.        metoprolol succinate (TOPROL-XL) 25 MG 24 hr tablet Take 12.5 mg by mouth daily.        Mometasone Furoate 200 MCG/ACT AERO Inhale 2 puffs into the lungs at bedtime.       OLODATEROL HCL IN Inhale 2.5 mg into the lungs every morning. 2 puffs       omeprazole (PRILOSEC) 20 MG capsule Take 20 mg by mouth daily before breakfast.       OXYGEN Inhale 2 L/min into the lungs continuous.       pravastatin (PRAVACHOL) 40 MG tablet Take 40 mg by mouth at bedtime.       rOPINIRole (REQUIP) 0.25 MG tablet Take 0.25 mg by mouth 2 (two) times daily.  sodium chloride 1 g tablet Take 1 g by mouth 3 (three) times daily.        diphenhydrAMINE (BENADRYL) 25 MG tablet Take 25 mg by mouth at bedtime. (Patient not taking: Reported on 08/18/2020)        No current facility-administered medications for this visit.      REVIEW OF SYSTEMS:  [X]  denotes positive finding, [ ]  denotes negative finding Cardiac   Comments:  Chest pain or chest pressure:      Shortness of breath upon exertion:      Short of breath when lying flat:      Irregular heart rhythm:             Vascular      Pain in calf, thigh, or hip brought on by ambulation:      Pain in feet at night that wakes you up from your sleep:  x Left foot  Blood clot in your veins:      Leg swelling:              Pulmonary      Oxygen at home:      Productive cough:       Wheezing:              Neurologic      Sudden weakness in arms or legs:       Sudden numbness in arms or legs:       Sudden onset of difficulty speaking or slurred speech:      Temporary loss of vision in one eye:       Problems with dizziness:              Gastrointestinal      Blood in stool:       Vomited blood:              Genitourinary      Burning when urinating:       Blood in urine:             Psychiatric      Major depression:              Hematologic      Bleeding problems:      Problems with blood clotting too easily:             Skin      Rashes or ulcers:             Constitutional      Fever or chills:          PHYSICAL EXAM:    Vitals:    09/11/21 0918  BP: (!) 146/84  Pulse: 77  Resp: 16  Temp: 98.1 F (36.7 C)  TempSrc: Temporal  SpO2: 94%  Weight: 120 lb (54.4 kg)  Height: 5\' 10"  (1.778 m)      GENERAL: The patient is a well-nourished male, in no acute distress. The vital signs are documented above. CARDIAC: There is a regular rate and rhythm.  VASCULAR:  Bilateral femoral pulses palpable No palpable pedal pulses PULMONARY: No respiratory distress. ABDOMEN: Soft and non-tender. MUSCULOSKELETAL: There are no major deformities or  cyanosis. NEUROLOGIC: No focal weakness or paresthesias are detected. SKIN: There are no ulcers or rashes noted. PSYCHIATRIC: The patient has a normal affect.   DATA:    ABIs yesterday were 0.75 on the right and 0.71 on the left with evidence of moderate arterial disease   Assessment/Plan:   76 year old  male history of atrial fibrillation on Eliquis, COPD, lung cancer, tobacco abuse that presents for evaluation of critical limb ischemia in the left lower extremity with rest pain.  He describes at least 10 years of rest pain.  His ABIs were severely depressed at the [place].  I have recommended aortogram with lower extremity arteriogram with a focus on the left leg through a transfemoral approach.  Discussed we will evaluate for any endovascular options in the left lower extremity.  May ultimately require open surgical bypass as I discussed with him.  Risk benefits discussed.  I offered to schedule this as early as Thursday and he will need to hold his Eliquis 48 hours.  He is going to check with his calendar and then our office will contact to schedule.     Brett Heck, MD [facility] [place] Office: [phone]

## 2021-09-13 NOTE — Op Note (Signed)
    Patient name: Brett Saunders MRN: [id number] DOB: Jul 30, 1945 Sex: male  09/13/2021 Pre-operative Diagnosis: Critical limb ischemia of the left lower extremity with rest pain Post-operative diagnosis:  Same Surgeon:  [name], MD Procedure Performed: 1.  Ultrasound-guided access right common femoral artery 2.  Aortogram with catheter selection of aorta 3.  Bilateral lower extremity arteriogram with runoff 4.  24 minutes of monitored moderate conscious sedation time  Indications: 76 year old male seen in the office on Tuesday with 10 years of rest pain in the left foot.  He has been evaluated by Dr. Nicola Girt at the [facility] and sent to see [place] in [facility] for further evaluation.  He is here today for aortogram with lower extremity arteriogram and possible intervention with a focus on the left leg.  Risk and benefits discussed.  Findings:   Aortogram showed a patent infrarenal aorta that has some ectasia with patent bilateral renal arteries.  Both iliac arteries are patent.  On the left which is the side of interest, he has a high-grade 80% stenosis in the distal left external iliac artery.  The left common femoral artery has a high-grade calcified stenosis greater than 80%.  The left profunda is patent.  The left SFA is patent in the proximal to mid segment and then he has a occluded SFA above-knee popliteal artery with reconstitution of a below-knee popliteal target with two-vessel runoff in the posterior tibial and peroneal artery.  Anterior tibial appears occluded shortly after takeoff.  Right lower extremity arteriogram shows a patent common femoral with a patent profunda.  The proximal SFA is patent.  Mid SFA is occluded and reconstitutes an [place] and then the above-knee popliteal artery is occluded.  Better below-knee popliteal target.  Two-vessel runoff in the peroneal and posterior tibial.   Procedure:  The patient was identified in the holding area and taken to room 8.  The patient  was then placed supine on the table and prepped and draped in the usual sterile fashion.  A time out was called.  Ultrasound was used to evaluate the right common femoral artery.  It was patent .  A digital ultrasound image was acquired.  A micropuncture needle was used to access the right common femoral artery under ultrasound guidance.  An 018 wire was advanced without resistance and a micropuncture sheath was placed.  The 018 wire was removed and a benson wire was placed.  The micropuncture sheath was exchanged for a 5 french sheath.  An omniflush catheter was advanced over the wire to the level of L-1.  An abdominal angiogram was obtained.  Next the catheter was pulled down and bilateral lower extremity runoff was obtained.  Pertinent findings are noted above.  Patient will be scheduled for left iliofemoral endarterectomy.  Wires and catheters were removed.  Taken to holding for sheath removal.  Plan: Patient will be scheduled for left iliofemoral endarterectomy.   [name], MD [facility] [place] Office: [phone]

## 2021-09-13 NOTE — Discharge Instructions (Addendum)
Patient will be scheduled for left iliofemoral endarterectomy.  Patient can resume his anticoagulation postprocedure day 1.  Office will contact to schedule surgery.  Call with questions or concerns.  Femoral Site Care This sheet gives you information about how to care for yourself after your procedure. Your health care provider may also give you more specific instructions. If you have problems or questions, contact your health care provider. What can I expect after the procedure?  After the procedure, it is common to have: Bruising that usually fades within 1-2 weeks. Tenderness at the site. Follow these instructions at home: Wound care Follow instructions from your health care provider about how to take care of your insertion site. Make sure you: Wash your hands with soap and water before you change your bandage (dressing). If soap and water are not available, use hand sanitizer. Remove your dressing as told by your health care provider. 24 hours Do not take baths, swim, or use a hot tub until your health care provider approves. You may shower 24-48 hours after the procedure or as told by your health care provider. Gently wash the site with plain soap and water. Pat the area dry with a clean towel. Do not rub the site. This may cause bleeding. Do not apply powder or lotion to the site. Keep the site clean and dry. Check your femoral site every day for signs of infection. Check for: Redness, swelling, or pain. Fluid or blood. Warmth. Pus or a bad smell. Activity For the first 2-3 days after your procedure, or as long as directed: Avoid climbing stairs as much as possible. Do not squat. Do not lift anything that is heavier than 10 lb (4.5 kg), or the limit that you are told, until your health care provider says that it is safe. For 5 days Rest as directed. Avoid sitting for a long time without moving. Get up to take short walks every 1-2 hours. Do not drive for 24 hours if you were  given a medicine to help you relax (sedative). General instructions Take over-the-counter and prescription medicines only as told by your health care provider. Keep all follow-up visits as told by your health care provider. This is important. Contact a health care provider if you have: A fever or chills. You have redness, swelling, or pain around your insertion site. Get help right away if: The catheter insertion area swells very fast. You pass out. You suddenly start to sweat or your skin gets clammy. The catheter insertion area is bleeding, and the bleeding does not stop when you hold steady pressure on the area. The area near or just beyond the catheter insertion site becomes pale, cool, tingly, or numb. These symptoms may represent a serious problem that is an emergency. Do not wait to see if the symptoms will go away. Get medical help right away. Call your local emergency services (911 in the [place] Do not drive yourself to the hospital. Summary After the procedure, it is common to have bruising that usually fades within 1-2 weeks. Check your femoral site every day for signs of infection. Do not lift anything that is heavier than 10 lb (4.5 kg), or the limit that you are told, until your health care provider says that it is safe. This information is not intended to replace advice given to you by your health care provider. Make sure you discuss any questions you have with your health care provider. Document Revised: 03/17/2017 Document Reviewed: 03/17/2017 Elsevier Patient Education  [phone]  [employer].

## 2021-09-14 ENCOUNTER — Other Ambulatory Visit: Payer: Self-pay

## 2021-09-14 ENCOUNTER — Encounter (HOSPITAL_COMMUNITY): Payer: Self-pay | Admitting: Vascular Surgery

## 2021-09-14 DIAGNOSIS — I70223 Atherosclerosis of native arteries of extremities with rest pain, bilateral legs: Secondary | ICD-10-CM

## 2021-09-24 NOTE — Pre-Procedure Instructions (Signed)
Surgical Instructions    Your procedure is scheduled on Monday, July 17th.  Report to [facility] Main Entrance "A" at 7:15 A.M., then check in with the Admitting office.  Call this number if you have problems the morning of surgery:  [phone]   If you have any questions prior to your surgery date call [phone]: Open Monday-Friday 8am-4pm    Remember:  Do not eat or drink after midnight the night before your surgery   Take these medicines the morning of surgery with A SIP OF WATER  amiodarone (PACERONE)  metoprolol succinate (TOPROL-XL)  Inhalers rOPINIRole (REQUIP)   Take these medications as needed: omeprazole (PRILOSEC) Albuterol inhaler Albuterol nebulizer  Please bring all inhalers with you to the hospital.  Follow your surgeon's instructions on when to stop apixaban (ELIQUIS).  If no instructions were given by your surgeon then you will need to call the office to get those instructions.    As of today, STOP taking any Aspirin (unless otherwise instructed by your surgeon) Aleve, Naproxen, Ibuprofen, Motrin, Advil, Goody's, BC's, all herbal medications, fish oil, and all vitamins.                     Do NOT Smoke (Tobacco/Vaping) for 24 hours prior to your procedure.  If you use a CPAP at night, you may bring your mask/headgear for your overnight stay.   Contacts, glasses, piercing's, hearing aid's, dentures or partials may not be worn into surgery, please bring cases for these belongings.    For patients admitted to the hospital, discharge time will be determined by your treatment team.   Patients discharged the day of surgery will not be allowed to drive home, and someone needs to stay with them for 24 hours.  SURGICAL WAITING ROOM VISITATION Patients having surgery or a procedure may have two support people in the waiting room. These visitors may be switched out with other visitors if needed. Children under the age of 39 must have an adult accompany them who  is not the patient. If the patient needs to stay at the hospital during part of their recovery, the visitor guidelines for inpatient rooms apply.  Please refer to the [facility] website for the visitor guidelines for Inpatients (after your surgery is over and you are in a regular room).    Special instructions:   - Preparing For Surgery  Before surgery, you can play an important role. Because skin is not sterile, your skin needs to be as free of germs as possible. You can reduce the number of germs on your skin by washing with CHG (chlorahexidine gluconate) Soap before surgery.  CHG is an antiseptic cleaner which kills germs and bonds with the skin to continue killing germs even after washing.    Oral Hygiene is also important to reduce your risk of infection.  Remember - BRUSH YOUR TEETH THE MORNING OF SURGERY WITH YOUR REGULAR TOOTHPASTE  Please do not use if you have an allergy to CHG or antibacterial soaps. If your skin becomes reddened/irritated stop using the CHG.  Do not shave (including legs and underarms) for at least 48 hours prior to first CHG shower. It is OK to shave your face.  Please follow these instructions carefully.   Shower the NIGHT BEFORE SURGERY and the MORNING OF SURGERY  If you chose to wash your hair, wash your hair first as usual with your normal shampoo.  After you shampoo, rinse your hair and body thoroughly to remove  the shampoo.  Use CHG Soap as you would any other liquid soap. You can apply CHG directly to the skin and wash gently with a scrungie or a clean washcloth.   Apply the CHG Soap to your body ONLY FROM THE NECK DOWN.  Do not use on open wounds or open sores. Avoid contact with your eyes, ears, mouth and genitals (private parts). Wash Face and genitals (private parts)  with your normal soap.   Wash thoroughly, paying special attention to the area where your surgery will be performed.  Thoroughly rinse your body with warm water from the  neck down.  DO NOT shower/wash with your normal soap after using and rinsing off the CHG Soap.  Pat yourself dry with a CLEAN TOWEL.  Wear CLEAN PAJAMAS to bed the night before surgery  Place CLEAN SHEETS on your bed the night before your surgery  DO NOT SLEEP WITH PETS.   Day of Surgery: Take a shower with CHG soap. Do not wear jewelry Do not wear lotions, powders, colognes, or deodorant. Men may shave face and neck. Do not bring valuables to the hospital.  [facility] is not responsible for any belongings or valuables. Wear Clean/Comfortable clothing the morning of surgery Remember to brush your teeth WITH YOUR REGULAR TOOTHPASTE.   Please read over the following fact sheets that you were given.    If you received a COVID test during your pre-op visit  it is requested that you wear a mask when out in public, stay away from anyone that may not be feeling well and notify your surgeon if you develop symptoms. If you have been in contact with anyone that has tested positive in the last 10 days please notify you surgeon.

## 2021-09-25 ENCOUNTER — Other Ambulatory Visit: Payer: Self-pay

## 2021-09-25 ENCOUNTER — Encounter (HOSPITAL_COMMUNITY): Payer: Self-pay

## 2021-09-25 ENCOUNTER — Encounter (HOSPITAL_COMMUNITY)
Admission: RE | Admit: 2021-09-25 | Discharge: 2021-09-25 | Disposition: A | Discharge status: Home / Self Care | Payer: No Typology Code available for payment source | Source: Ambulatory Visit | Attending: Vascular Surgery | Admitting: Vascular Surgery

## 2021-09-25 VITALS — BP 145/71 | HR 78 | Temp 98.4°F | Resp 17 | Ht 70.0 in | Wt 121.1 lb

## 2021-09-25 DIAGNOSIS — Z01818 Encounter for other preprocedural examination: Secondary | ICD-10-CM | POA: Diagnosis not present

## 2021-09-25 DIAGNOSIS — I70223 Atherosclerosis of native arteries of extremities with rest pain, bilateral legs: Secondary | ICD-10-CM

## 2021-09-25 LAB — COMPREHENSIVE METABOLIC PANEL
ALT: 15 U/L (ref 0–44)
AST: 26 U/L (ref 15–41)
Albumin: 4 g/dL (ref 3.5–5.0)
Alkaline Phosphatase: 80 U/L (ref 38–126)
Anion gap: 10 (ref 5–15)
BUN: 8 mg/dL (ref 8–23)
CO2: 25 mmol/L (ref 22–32)
Calcium: 9.3 mg/dL (ref 8.9–10.3)
Chloride: 97 mmol/L — ABNORMAL LOW (ref 98–111)
Creatinine, Ser: 0.9 mg/dL (ref 0.61–1.24)
GFR, Estimated: >60 mL/min (ref >60)
Glucose, Bld: 95 mg/dL (ref 70–99)
Potassium: 4.4 mmol/L (ref 3.5–5.1)
Sodium: 132 mmol/L — ABNORMAL LOW (ref 135–145)
Total Bilirubin: 0.6 mg/dL (ref 0.3–1.2)
Total Protein: 7.3 g/dL (ref 6.5–8.1)

## 2021-09-25 LAB — URINALYSIS, ROUTINE W REFLEX MICROSCOPIC
Bilirubin Urine: NEGATIVE
Glucose, UA: NEGATIVE mg/dL
Hgb urine dipstick: NEGATIVE
Ketones, ur: NEGATIVE mg/dL
Leukocytes,Ua: NEGATIVE
Nitrite: NEGATIVE
Protein, ur: NEGATIVE mg/dL
Specific Gravity, Urine: 1.008 (ref 1.005–1.030)
pH: 8 (ref 5.0–8.0)

## 2021-09-25 LAB — CBC
HCT: 39.9 % (ref 39.0–52.0)
Hemoglobin: 13 g/dL (ref 13.0–17.0)
MCH: 31.4 pg (ref 26.0–34.0)
MCHC: 32.6 g/dL (ref 30.0–36.0)
MCV: 96.4 fL (ref 80.0–100.0)
Platelets: 163 10*3/uL (ref 150–400)
RBC: 4.14 MIL/uL — ABNORMAL LOW (ref 4.22–5.81)
RDW: 13.5 % (ref 11.5–15.5)
WBC: 9.4 10*3/uL (ref 4.0–10.5)
nRBC: 0 % (ref 0.0–0.2)

## 2021-09-25 LAB — SURGICAL PCR SCREEN
MRSA, PCR: NEGATIVE
Staphylococcus aureus: NEGATIVE

## 2021-09-25 LAB — TYPE AND SCREEN
ABO/RH(D): B POS
Antibody Screen: NEGATIVE

## 2021-09-25 LAB — PROTIME-INR
INR: 1.1 (ref 0.8–1.2)
Prothrombin Time: 14.3 seconds (ref 11.4–15.2)

## 2021-09-25 LAB — APTT: aPTT: 35 seconds (ref 24–36)

## 2021-09-25 NOTE — Progress Notes (Signed)
Anesthesia Chart Review:  Follows with cardiology at the [facility] for hx of NICM s/p dual chamber ICD implant 2014 ([employer]), PAF on low dose amiodarone, prior CVA 2013, and PAD. Per cardiology notes available in care everywhere, pt was initially diagnosed with nonischemic cardiomyopathy in 2013 in [facility] and had a cardiac cath at that time showing no significant CAD.  He subsequently received ICD implant. He has had inappropriate shocks in the past for rapid afib. Most recent echo done October 2020 at [facility] showed LVEF 50%, normal LV size, normal wall motion, normal RV size and function, normal atria sizes, trace MR, moderately elevated PASP.  Last seen in follow-up at the [facility] by [facility], PA-C 08/01/21. Pt stable at that time. No changes to management. Device interrogation showed normal ICD function - battery longevity 5 months - stable lead impedances - appropriate sensing and pacing thresholds - Ap 29% - Vp <1% - 8 AT/AF episodes, EGMs show AF/AFL, longest 10 hours 52 minutes, AF/AFL burden <1% - 1 VT episode, EGM shows 13-beat NSVT.  Patient to hold Eliquis 3 days prior to surgery.  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 PFTs 2017: FEV1 1.11 L (30%), FVC 2.57 L (53%), ratio 43, TLC 82%, DLCO 24%.  Patient has refused repeat PFTs.  Perioperative cardiac device order sheet has been faxed to [facility] clinic.  Chest CT 03/27/2021 (Care Everywhere): Impression: Stable posttreatment changes of the left lung without definite CT  evidence of local recurrence or intrathoracic metastatic disease.  Continued attention on follow-up per clinical protocol.   TTE 12/23/2018:  1. Left ventricular ejection fraction, by visual estimation, is 50 to  55%. The left ventricle has normal function. Normal left ventricular size.  Left  ventricular septal wall thickness was normal. There is no left  ventricular hypertrophy.   2. Abnormal septal motion      Abnormal GLS -12.3.   3. Global right ventricle has normal systolic function.The right  ventricular size is normal. No increase in right ventricular wall  thickness.   4. Left atrial size was normal.   5. Right atrial size was normal.   6. The mitral valve is normal in structure. Trace mitral valve  regurgitation. No evidence of mitral stenosis.   7. The tricuspid valve is normal in structure. Tricuspid valve  regurgitation was not visualized by color flow Doppler.   8. The aortic valve is normal in structure. Aortic valve regurgitation  was not visualized by color flow Doppler. Structurally normal aortic  valve, with no evidence of sclerosis or stenosis.   9. The pulmonic valve was normal in structure. Pulmonic valve  regurgitation is not visualized by color flow Doppler.  10. Moderately elevated pulmonary artery systolic pressure.  11. The inferior vena cava is normal in size with greater than 50%  respiratory variability, suggesting right atrial pressure of 3 mmHg.  12. Pacing wires in RA/RV.

## 2021-09-25 NOTE — Progress Notes (Signed)
PCP - Dr. Lin Landsman Cardiologist - Ileene Hutchinson, PA-[place] to [facility] Rep Notified - Emailed [employer] reps.  Chest x-ray - n/a EKG - 09/13/21 Stress Test - 01/11/19 ECHO - 12/23/18 Cardiac Cath - may have had one in [place], [place] per notes.   Sleep Study - denies CPAP - denies  Blood Thinner Instructions: Eliquis, HOLD 3 days per [facility] notes Aspirin Instructions: n/a  COVID TEST- n/a   Anesthesia review: Yes, heart hx. Requested device orders from [facility]  Patient denies shortness of breath, fever, cough and chest pain at PAT appointment   All instructions explained to the patient, with a verbal understanding of the material. Patient agrees to go over the instructions while at home for a better understanding. Patient also instructed to self quarantine after being tested for COVID-19. The opportunity to ask questions was provided.

## 2021-09-27 NOTE — Anesthesia Preprocedure Evaluation (Addendum)
Anesthesia Evaluation  Patient identified by MRN, date of birth, ID band Patient awake    Reviewed: Allergy & Precautions, NPO status , Patient's Chart, lab work & pertinent test results  Airway Mallampati: I  TM Distance: >3 FB Neck ROM: Full    Dental  (+) Edentulous Upper, Edentulous Lower   Pulmonary COPD,  COPD inhaler, Current Smoker and Patient abstained from smoking.,     + decreased breath sounds      Cardiovascular hypertension, Pt. on home beta blockers + CAD, + Peripheral Vascular Disease and +CHF  + dysrhythmias Atrial Fibrillation  Rhythm:Regular Rate:Normal  Echo: 1. Left ventricular ejection fraction, by visual estimation, is 50 to  55%. The left ventricle has normal function. Normal left ventricular size.  Left ventricular septal wall thickness was normal. There is no left  ventricular hypertrophy.  2. Abnormal septal motion   Abnormal GLS -12.3.  3. Global right ventricle has normal systolic function.The right  ventricular size is normal. No increase in right ventricular wall  thickness.  4. Left atrial size was normal.  5. Right atrial size was normal.  6. The mitral valve is normal in structure. Trace mitral valve  regurgitation. No evidence of mitral stenosis.  7. The tricuspid valve is normal in structure. Tricuspid valve  regurgitation was not visualized by color flow Doppler.  8. The aortic valve is normal in structure. Aortic valve regurgitation  was not visualized by color flow Doppler. Structurally normal aortic  valve, with no evidence of sclerosis or stenosis.  9. The pulmonic valve was normal in structure. Pulmonic valve  regurgitation is not visualized by color flow Doppler.  10. Moderately elevated pulmonary artery systolic pressure.  11. The inferior vena cava is normal in size with greater than 50%  respiratory variability, suggesting right atrial pressure of 3 mmHg.  12. Pacing  wires in RA/RV.    Neuro/Psych CVA negative psych ROS   GI/Hepatic Neg liver ROS, GERD  Medicated,  Endo/Other  negative endocrine ROS  Renal/GU negative Renal ROS     Musculoskeletal negative musculoskeletal ROS (+)   Abdominal Normal abdominal exam  (+)   Peds  Hematology negative hematology ROS (+)   Anesthesia Other Findings   Reproductive/Obstetrics negative OB ROS                           Anesthesia Physical Anesthesia Plan  ASA: 3  Anesthesia Plan: General   Post-op Pain Management:    Induction: Intravenous  PONV Risk Score and Plan: 2 and Ondansetron, Dexamethasone and Treatment may vary due to age or medical condition  Airway Management Planned: Oral ETT  Additional Equipment: Arterial line  Intra-op Plan:   Post-operative Plan: Extubation in OR  Informed Consent: I have reviewed the patients History and Physical, chart, labs and discussed the procedure including the risks, benefits and alternatives for the proposed anesthesia with the patient or authorized representative who has indicated his/her understanding and acceptance.     Dental advisory given  Plan Discussed with: CRNA  Anesthesia Plan Comments: (Apply magnet in room.  PAT note by Karoline Caldwell, PA-C: Follows with cardiology at the [facility] for hx of NICM s/p dual chamber ICD implant 2014 ([employer]), PAF on low dose amiodarone, prior CVA 2013, DVT, and PAD. Per cardiology notes available in care everywhere, pt was initially diagnosed with nonischemic cardiomyopathy in 2013 in [facility] and had a cardiac cath at that time showing no significant  CAD.  He subsequently received ICD implant. He has had inappropriate shocks in the past for rapid afib. Most recent echo done October 2020 at [facility] showed LVEF 50%, normal LV size, normal wall motion, normal RV size and function, normal atria sizes, trace MR, moderately elevated PASP.  Last seen in follow-up at the [facility]  by [facility], PA-C 08/01/21. Pt stable at that time. No changes to management. Device interrogation showed normal ICD function - battery longevity 5 months - stable lead impedances - appropriate sensing and pacing thresholds - Ap 29% - Vp <1% - 8 AT/AF episodes, EGMs show AF/AFL, longest 10 hours 52 minutes, AF/AFL burden <1% - 1 VT episode, EGM shows 13-beat NSVT.  Patient to hold Eliquis 3 days prior to surgery.  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 PFTs 2017: FEV1 1.11 L (30%), FVC 2.57 L (53%), ratio 43, TLC 82%, DLCO 24%.  Patient has refused repeat PFTs. He did participate in pulmonary rehab exercise program at [facility], completed Aug 2022.   Perioperative cardiac device order sheet has been faxed to [facility] clinic.  Preop labs reviewed, mild hyponatremia sodium 132, otherwise unremarkable.  EKG 09/13/2021: Sinus rhythm.  Rate 63.  LAD. Pacing spikes noted.  Chest CT 03/27/2021 (Care Everywhere): Impression: Stable posttreatment changes of the left lung without definite CT  evidence of local recurrence or intrathoracic metastatic disease.  Continued attention on follow-up per clinical protocol.   TTE 12/23/2018: 1. Left ventricular ejection fraction, by visual estimation, is 50 to  55%. The left ventricle has normal function. Normal left ventricular size.  Left ventricular septal wall thickness was normal. There is no left  ventricular hypertrophy.  2. Abnormal septal motion   Abnormal GLS -12.3.  3. Global right ventricle has normal systolic function.The right  ventricular size is normal. No increase in right ventricular wall  thickness.  4. Left atrial size was normal.  5. Right atrial size was normal.  6. The mitral valve is normal in structure. Trace mitral valve  regurgitation. No evidence of mitral  stenosis.  7. The tricuspid valve is normal in structure. Tricuspid valve  regurgitation was not visualized by color flow Doppler.  8. The aortic valve is normal in structure. Aortic valve regurgitation  was not visualized by color flow Doppler. Structurally normal aortic  valve, with no evidence of sclerosis or stenosis.  9. The pulmonic valve was normal in structure. Pulmonic valve  regurgitation is not visualized by color flow Doppler.  10. Moderately elevated pulmonary artery systolic pressure.  11. The inferior vena cava is normal in size with greater than 50%  respiratory variability, suggesting right atrial pressure of 3 mmHg.  12. Pacing wires in RA/RV. )      Anesthesia Quick Evaluation

## 2021-10-01 ENCOUNTER — Encounter (HOSPITAL_COMMUNITY)
Admission: RE | Disposition: A | Discharge status: Home / Self Care | Payer: Self-pay | Source: Home / Self Care | Attending: Vascular Surgery

## 2021-10-01 ENCOUNTER — Other Ambulatory Visit: Payer: Self-pay

## 2021-10-01 ENCOUNTER — Encounter (HOSPITAL_COMMUNITY): Payer: Self-pay | Admitting: Vascular Surgery

## 2021-10-01 ENCOUNTER — Inpatient Hospital Stay (HOSPITAL_COMMUNITY): Payer: No Typology Code available for payment source | Admitting: Anesthesiology

## 2021-10-01 ENCOUNTER — Inpatient Hospital Stay (HOSPITAL_COMMUNITY)
Admission: RE | Admit: 2021-10-01 | Discharge: 2021-10-02 | DRG: 271 | Disposition: A | Discharge status: Home / Self Care | Payer: No Typology Code available for payment source | Attending: Vascular Surgery | Admitting: Vascular Surgery

## 2021-10-01 ENCOUNTER — Inpatient Hospital Stay (HOSPITAL_COMMUNITY): Payer: No Typology Code available for payment source | Admitting: Physician Assistant

## 2021-10-01 DIAGNOSIS — Z7901 Long term (current) use of anticoagulants: Secondary | ICD-10-CM | POA: Diagnosis not present

## 2021-10-01 DIAGNOSIS — Z9981 Dependence on supplemental oxygen: Secondary | ICD-10-CM

## 2021-10-01 DIAGNOSIS — J9611 Chronic respiratory failure with hypoxia: Secondary | ICD-10-CM | POA: Diagnosis present

## 2021-10-01 DIAGNOSIS — F17210 Nicotine dependence, cigarettes, uncomplicated: Secondary | ICD-10-CM | POA: Diagnosis present

## 2021-10-01 DIAGNOSIS — Z7951 Long term (current) use of inhaled steroids: Secondary | ICD-10-CM | POA: Diagnosis not present

## 2021-10-01 DIAGNOSIS — I70222 Atherosclerosis of native arteries of extremities with rest pain, left leg: Secondary | ICD-10-CM

## 2021-10-01 DIAGNOSIS — Z86718 Personal history of other venous thrombosis and embolism: Secondary | ICD-10-CM | POA: Diagnosis not present

## 2021-10-01 DIAGNOSIS — Z95810 Presence of automatic (implantable) cardiac defibrillator: Secondary | ICD-10-CM | POA: Diagnosis not present

## 2021-10-01 DIAGNOSIS — I2510 Atherosclerotic heart disease of native coronary artery without angina pectoris: Secondary | ICD-10-CM

## 2021-10-01 DIAGNOSIS — I4891 Unspecified atrial fibrillation: Secondary | ICD-10-CM | POA: Diagnosis present

## 2021-10-01 DIAGNOSIS — Z85118 Personal history of other malignant neoplasm of bronchus and lung: Secondary | ICD-10-CM | POA: Diagnosis not present

## 2021-10-01 DIAGNOSIS — Z8673 Personal history of transient ischemic attack (TIA), and cerebral infarction without residual deficits: Secondary | ICD-10-CM

## 2021-10-01 DIAGNOSIS — Z79899 Other long term (current) drug therapy: Secondary | ICD-10-CM | POA: Diagnosis not present

## 2021-10-01 DIAGNOSIS — K219 Gastro-esophageal reflux disease without esophagitis: Secondary | ICD-10-CM | POA: Diagnosis present

## 2021-10-01 DIAGNOSIS — J449 Chronic obstructive pulmonary disease, unspecified: Secondary | ICD-10-CM | POA: Diagnosis present

## 2021-10-01 DIAGNOSIS — I739 Peripheral vascular disease, unspecified: Principal | ICD-10-CM | POA: Diagnosis present

## 2021-10-01 DIAGNOSIS — I509 Heart failure, unspecified: Secondary | ICD-10-CM | POA: Diagnosis not present

## 2021-10-01 DIAGNOSIS — I255 Ischemic cardiomyopathy: Secondary | ICD-10-CM | POA: Diagnosis present

## 2021-10-01 DIAGNOSIS — I110 Hypertensive heart disease with heart failure: Secondary | ICD-10-CM

## 2021-10-01 DIAGNOSIS — I10 Essential (primary) hypertension: Secondary | ICD-10-CM | POA: Diagnosis present

## 2021-10-01 DIAGNOSIS — Z881 Allergy status to other antibiotic agents status: Secondary | ICD-10-CM

## 2021-10-01 DIAGNOSIS — I700 Atherosclerosis of aorta: Secondary | ICD-10-CM | POA: Diagnosis present

## 2021-10-01 HISTORY — PX: ENDARTERECTOMY: SHX5162

## 2021-10-01 LAB — POCT ACTIVATED CLOTTING TIME: Activated Clotting Time: 263 seconds

## 2021-10-01 LAB — ABO/RH: ABO/RH(D): B POS

## 2021-10-01 SURGERY — ENDARTERECTOMY, ILIAC
Anesthesia: General | Site: Groin | Laterality: Left

## 2021-10-01 MED ORDER — SODIUM CHLORIDE 0.9 % IV SOLN: INTRAVENOUS | Status: DC

## 2021-10-01 MED ORDER — HEPARIN 6000 UNIT IRRIGATION SOLUTION
Status: DC | PRN
  Administered 2021-10-01: 1

## 2021-10-01 MED ORDER — 0.9 % SODIUM CHLORIDE (POUR BTL) OPTIME
TOPICAL | Status: DC | PRN
  Administered 2021-10-01 (×2): 1000 mL

## 2021-10-01 MED ORDER — ORAL CARE MOUTH RINSE: 15.0000 mL | Freq: Once | OROMUCOSAL | Status: AC

## 2021-10-01 MED ORDER — PANTOPRAZOLE SODIUM 40 MG PO TBEC
40.0000 mg | DELAYED_RELEASE_TABLET | Freq: Every day | ORAL | Status: DC
  Administered 2021-10-02: 40 mg via ORAL
  Filled 2021-10-01: qty 1

## 2021-10-01 MED ORDER — FENTANYL CITRATE (PF) 250 MCG/5ML IJ SOLN
INTRAMUSCULAR | Status: AC
  Filled 2021-10-01: qty 5

## 2021-10-01 MED ORDER — PROMETHAZINE HCL 25 MG/ML IJ SOLN: 6.2500 mg | INTRAMUSCULAR | Status: DC | PRN

## 2021-10-01 MED ORDER — ROPINIROLE HCL 0.25 MG PO TABS
0.2500 mg | ORAL_TABLET | Freq: Two times a day (BID) | ORAL | Status: DC
  Administered 2021-10-02: 0.25 mg via ORAL
  Filled 2021-10-01 (×2): qty 1

## 2021-10-01 MED ORDER — SODIUM CHLORIDE 0.9 % IV SOLN: 500.0000 mL | Freq: Once | INTRAVENOUS | Status: DC | PRN

## 2021-10-01 MED ORDER — HEPARIN 6000 UNIT IRRIGATION SOLUTION
Status: AC
  Filled 2021-10-01: qty 500

## 2021-10-01 MED ORDER — PHENYLEPHRINE HCL-NACL 20-0.9 MG/250ML-% IV SOLN
INTRAVENOUS | Status: DC | PRN
  Administered 2021-10-01: 50 ug/min via INTRAVENOUS

## 2021-10-01 MED ORDER — POTASSIUM CHLORIDE CRYS ER 20 MEQ PO TBCR: 20.0000 meq | EXTENDED_RELEASE_TABLET | Freq: Every day | ORAL | Status: DC | PRN

## 2021-10-01 MED ORDER — AMISULPRIDE (ANTIEMETIC) 5 MG/2ML IV SOLN: 10.0000 mg | Freq: Once | INTRAVENOUS | Status: DC | PRN

## 2021-10-01 MED ORDER — PROTAMINE SULFATE 10 MG/ML IV SOLN
INTRAVENOUS | Status: DC | PRN
  Administered 2021-10-01 (×2): 10 mg via INTRAVENOUS
  Administered 2021-10-01: 20 mg via INTRAVENOUS

## 2021-10-01 MED ORDER — METOPROLOL SUCCINATE ER 25 MG PO TB24
12.5000 mg | ORAL_TABLET | Freq: Every day | ORAL | Status: DC
  Administered 2021-10-02: 12.5 mg via ORAL
  Filled 2021-10-01: qty 1

## 2021-10-01 MED ORDER — HYDRALAZINE HCL 20 MG/ML IJ SOLN: 5.0000 mg | INTRAMUSCULAR | Status: DC | PRN

## 2021-10-01 MED ORDER — CHLORHEXIDINE GLUCONATE CLOTH 2 % EX PADS: 6.0000 | MEDICATED_PAD | Freq: Once | CUTANEOUS | Status: DC

## 2021-10-01 MED ORDER — VANCOMYCIN HCL IN DEXTROSE 1-5 GM/200ML-% IV SOLN
1000.0000 mg | Freq: Two times a day (BID) | INTRAVENOUS | Status: AC
  Administered 2021-10-01: 1000 mg via INTRAVENOUS
  Filled 2021-10-01: qty 200

## 2021-10-01 MED ORDER — DOCUSATE SODIUM 100 MG PO CAPS
100.0000 mg | ORAL_CAPSULE | Freq: Every day | ORAL | Status: DC
  Administered 2021-10-02: 100 mg via ORAL
  Filled 2021-10-01: qty 1

## 2021-10-01 MED ORDER — ONDANSETRON HCL 4 MG/2ML IJ SOLN
INTRAMUSCULAR | Status: DC | PRN
  Administered 2021-10-01: 4 mg via INTRAVENOUS

## 2021-10-01 MED ORDER — VANCOMYCIN HCL IN DEXTROSE 1-5 GM/200ML-% IV SOLN
1000.0000 mg | INTRAVENOUS | Status: AC
  Administered 2021-10-01: 1000 mg via INTRAVENOUS
  Filled 2021-10-01: qty 200

## 2021-10-01 MED ORDER — PHENOL 1.4 % MT LIQD: 1.0000 | OROMUCOSAL | Status: DC | PRN

## 2021-10-01 MED ORDER — PRAVASTATIN SODIUM 40 MG PO TABS
40.0000 mg | ORAL_TABLET | Freq: Every day | ORAL | Status: DC
  Filled 2021-10-01: qty 1

## 2021-10-01 MED ORDER — SODIUM CHLORIDE 0.9 % IV SOLN
INTRAVENOUS | Status: DC
Start: 2021-10-01 — End: 2021-10-02

## 2021-10-01 MED ORDER — METOPROLOL TARTRATE 5 MG/5ML IV SOLN: 2.0000 mg | INTRAVENOUS | Status: DC | PRN

## 2021-10-01 MED ORDER — GUAIFENESIN-DM 100-10 MG/5ML PO SYRP: 15.0000 mL | ORAL_SOLUTION | ORAL | Status: DC | PRN

## 2021-10-01 MED ORDER — ACETAMINOPHEN 325 MG PO TABS: 325.0000 mg | ORAL_TABLET | ORAL | Status: DC | PRN

## 2021-10-01 MED ORDER — ACETAMINOPHEN 10 MG/ML IV SOLN: 1000.0000 mg | Freq: Once | INTRAVENOUS | Status: DC | PRN

## 2021-10-01 MED ORDER — PROPOFOL 10 MG/ML IV BOLUS
INTRAVENOUS | Status: DC | PRN
  Administered 2021-10-01: 60 mg via INTRAVENOUS

## 2021-10-01 MED ORDER — ACETAMINOPHEN 650 MG RE SUPP: 325.0000 mg | RECTAL | Status: DC | PRN

## 2021-10-01 MED ORDER — ALBUTEROL SULFATE (2.5 MG/3ML) 0.083% IN NEBU
2.5000 mg | INHALATION_SOLUTION | Freq: Every day | RESPIRATORY_TRACT | Status: DC
  Administered 2021-10-01 – 2021-10-02 (×2): 2.5 mg via RESPIRATORY_TRACT
  Filled 2021-10-01 (×2): qty 3

## 2021-10-01 MED ORDER — SUGAMMADEX SODIUM 200 MG/2ML IV SOLN
INTRAVENOUS | Status: DC | PRN
  Administered 2021-10-01: 200 mg via INTRAVENOUS

## 2021-10-01 MED ORDER — STERILE WATER FOR IRRIGATION IR SOLN
Status: DC | PRN
  Administered 2021-10-01: 1000 mL

## 2021-10-01 MED ORDER — HEPARIN SODIUM (PORCINE) 1000 UNIT/ML IJ SOLN
INTRAMUSCULAR | Status: DC | PRN
  Administered 2021-10-01: 6000 [IU] via INTRAVENOUS

## 2021-10-01 MED ORDER — LACTATED RINGERS IV SOLN: INTRAVENOUS | Status: DC

## 2021-10-01 MED ORDER — FENTANYL CITRATE (PF) 100 MCG/2ML IJ SOLN: 25.0000 ug | INTRAMUSCULAR | Status: DC | PRN

## 2021-10-01 MED ORDER — MORPHINE SULFATE (PF) 2 MG/ML IV SOLN: 2.0000 mg | INTRAVENOUS | Status: DC | PRN

## 2021-10-01 MED ORDER — AMIODARONE HCL 200 MG PO TABS
100.0000 mg | ORAL_TABLET | Freq: Every morning | ORAL | Status: DC
  Administered 2021-10-02: 100 mg via ORAL
  Filled 2021-10-01: qty 1

## 2021-10-01 MED ORDER — ALUM & MAG HYDROXIDE-SIMETH 200-200-20 MG/5ML PO SUSP: 15.0000 mL | ORAL | Status: DC | PRN

## 2021-10-01 MED ORDER — ONDANSETRON HCL 4 MG/2ML IJ SOLN: 4.0000 mg | Freq: Four times a day (QID) | INTRAMUSCULAR | Status: DC | PRN

## 2021-10-01 MED ORDER — DEXAMETHASONE SODIUM PHOSPHATE 4 MG/ML IJ SOLN
INTRAMUSCULAR | Status: DC | PRN
  Administered 2021-10-01: 10 mg via INTRAVENOUS

## 2021-10-01 MED ORDER — CHLORHEXIDINE GLUCONATE 0.12 % MT SOLN
15.0000 mL | Freq: Once | OROMUCOSAL | Status: AC
  Administered 2021-10-01: 15 mL via OROMUCOSAL
  Filled 2021-10-01: qty 15

## 2021-10-01 MED ORDER — PHENYLEPHRINE HCL-NACL 20-0.9 MG/250ML-% IV SOLN
INTRAVENOUS | Status: AC
  Filled 2021-10-01: qty 250

## 2021-10-01 MED ORDER — LIDOCAINE 2% (20 MG/ML) 5 ML SYRINGE
INTRAMUSCULAR | Status: DC | PRN
  Administered 2021-10-01: 60 mg via INTRAVENOUS

## 2021-10-01 MED ORDER — ACETAMINOPHEN 160 MG/5ML PO SOLN: 325.0000 mg | ORAL | Status: DC | PRN

## 2021-10-01 MED ORDER — MAGNESIUM SULFATE 2 GM/50ML IV SOLN: 2.0000 g | Freq: Every day | INTRAVENOUS | Status: DC | PRN

## 2021-10-01 MED ORDER — PROPOFOL 10 MG/ML IV BOLUS
INTRAVENOUS | Status: AC
  Filled 2021-10-01: qty 20

## 2021-10-01 MED ORDER — OXYCODONE-ACETAMINOPHEN 5-325 MG PO TABS: 1.0000 | ORAL_TABLET | ORAL | Status: DC | PRN

## 2021-10-01 MED ORDER — HEMOSTATIC AGENTS (NO CHARGE) OPTIME
TOPICAL | Status: DC | PRN
  Administered 2021-10-01: 1 via TOPICAL

## 2021-10-01 MED ORDER — FENTANYL CITRATE (PF) 100 MCG/2ML IJ SOLN
INTRAMUSCULAR | Status: DC | PRN
  Administered 2021-10-01 (×3): 50 ug via INTRAVENOUS

## 2021-10-01 MED ORDER — OXYCODONE HCL 5 MG PO TABS: 5.0000 mg | ORAL_TABLET | Freq: Once | ORAL | Status: DC | PRN

## 2021-10-01 MED ORDER — LABETALOL HCL 5 MG/ML IV SOLN: 10.0000 mg | INTRAVENOUS | Status: DC | PRN

## 2021-10-01 MED ORDER — OXYCODONE HCL 5 MG/5ML PO SOLN: 5.0000 mg | Freq: Once | ORAL | Status: DC | PRN

## 2021-10-01 MED ORDER — ROCURONIUM BROMIDE 10 MG/ML (PF) SYRINGE
PREFILLED_SYRINGE | INTRAVENOUS | Status: DC | PRN
  Administered 2021-10-01: 50 mg via INTRAVENOUS

## 2021-10-01 SURGICAL SUPPLY — 44 items
BAG COUNTER SPONGE SURGICOUNT (BAG) ×2 IMPLANT
BNDG ELASTIC 4X5.8 VLCR STR LF (GAUZE/BANDAGES/DRESSINGS) IMPLANT
CANISTER SUCT 3000ML PPV (MISCELLANEOUS) ×2 IMPLANT
CANNULA VESSEL 3MM 2 BLNT TIP (CANNULA) ×1 IMPLANT
CLIP VESOCCLUDE MED 24/CT (CLIP) ×2 IMPLANT
CLIP VESOCCLUDE SM WIDE 24/CT (CLIP) ×2 IMPLANT
COVER PROBE W GEL 5X96 (DRAPES) ×1 IMPLANT
DERMABOND ADVANCED (GAUZE/BANDAGES/DRESSINGS) ×1
DERMABOND ADVANCED .7 DNX12 (GAUZE/BANDAGES/DRESSINGS) IMPLANT
ELECT REM PT RETURN 9FT ADLT (ELECTROSURGICAL) ×2
ELECTRODE REM PT RTRN 9FT ADLT (ELECTROSURGICAL) ×1 IMPLANT
GAUZE 4X4 16PLY ~~LOC~~+RFID DBL (SPONGE) ×1 IMPLANT
GLOVE BIO SURGEON STRL SZ7.5 (GLOVE) ×3 IMPLANT
GLOVE BIOGEL PI IND STRL 6.5 (GLOVE) IMPLANT
GLOVE BIOGEL PI IND STRL 8 (GLOVE) ×1 IMPLANT
GLOVE BIOGEL PI INDICATOR 6.5 (GLOVE) ×1
GLOVE BIOGEL PI INDICATOR 8 (GLOVE) ×2
GLOVE ECLIPSE 6.5 STRL STRAW (GLOVE) ×1 IMPLANT
GOWN STRL REUS W/ TWL LRG LVL3 (GOWN DISPOSABLE) ×3 IMPLANT
GOWN STRL REUS W/ TWL XL LVL3 (GOWN DISPOSABLE) ×1 IMPLANT
GOWN STRL REUS W/TWL LRG LVL3 (GOWN DISPOSABLE) ×6
GOWN STRL REUS W/TWL XL LVL3 (GOWN DISPOSABLE) ×2
HEMOSTAT SNOW SURGICEL 2X4 (HEMOSTASIS) ×1 IMPLANT
KIT BASIN OR (CUSTOM PROCEDURE TRAY) ×2 IMPLANT
KIT TURNOVER KIT B (KITS) ×2 IMPLANT
LOOP VESSEL MINI RED (MISCELLANEOUS) ×1 IMPLANT
NS IRRIG 1000ML POUR BTL (IV SOLUTION) ×4 IMPLANT
PACK PERIPHERAL VASCULAR (CUSTOM PROCEDURE TRAY) ×2 IMPLANT
PAD ARMBOARD 7.5X6 YLW CONV (MISCELLANEOUS) ×4 IMPLANT
PATCH VASC XENOSURE 1CMX6CM (Vascular Products) ×2 IMPLANT
PATCH VASC XENOSURE 1X6 (Vascular Products) IMPLANT
PENCIL BUTTON HOLSTER BLD 10FT (ELECTRODE) ×1 IMPLANT
SPONGE T-LAP 18X18 ~~LOC~~+RFID (SPONGE) ×1 IMPLANT
SUT MNCRL AB 4-0 PS2 18 (SUTURE) ×2 IMPLANT
SUT PROLENE 5 0 C 1 24 (SUTURE) ×2 IMPLANT
SUT PROLENE 5 0 C 1 36 (SUTURE) ×1 IMPLANT
SUT PROLENE 6 0 BV (SUTURE) ×2 IMPLANT
SUT VIC AB 2-0 CT1 27 (SUTURE) ×2
SUT VIC AB 2-0 CT1 TAPERPNT 27 (SUTURE) ×1 IMPLANT
SUT VIC AB 3-0 SH 27 (SUTURE) ×4
SUT VIC AB 3-0 SH 27X BRD (SUTURE) ×1 IMPLANT
TOWEL GREEN STERILE (TOWEL DISPOSABLE) ×2 IMPLANT
UNDERPAD 30X36 HEAVY ABSORB (UNDERPADS AND DIAPERS) ×2 IMPLANT
WATER STERILE IRR 1000ML POUR (IV SOLUTION) ×2 IMPLANT

## 2021-10-01 NOTE — H&P (Signed)
History and Physical Interval Note:  10/01/2021 9:47 AM  Brett Saunders  has presented today for surgery, with the diagnosis of Critical limb ischemia of the left lower extremity with rest pain.  The various methods of treatment have been discussed with the patient and family. After consideration of risks, benefits and other options for treatment, the patient has consented to  Procedure(s): LEFT ILIOFEMORAL ENDARTERECTOMY (Left) as a surgical intervention.  The patient's history has been reviewed, patient examined, no change in status, stable for surgery.  I have reviewed the patient's chart and labs.  Questions were answered to the patient's satisfaction.    Discussed left iliofemoral endarterectomy for CLI with rest pain.  Brett Saunders  Patient name: Brett Saunders  MRN: [id number]        DOB: 02-28-1946          Sex: male   REASON FOR CONSULT: Critical limb ischemia with rest pain in the left foot   HPI: Brett Saunders is a 76 y.o. male, with history of atrial fibrillation on Eliquis, COPD, lung cancer, tobacco abuse that presents for evaluation of critical limb ischemia in the left lower extremity with rest pain.  Patient describes at least 10 years of pain in his left foot.  He has numbness associated with this and often cannot sleep at night.  He has been evaluated at the [facility] by Dr. Nicola Girt and then was sent here for further evaluation.  His studies at the [place] showed an ABI of 0.6 on the right and 0.55 on the left.  Evidence of moderate peripheral vascular disease.  He states no previous interventions.  Still smoking several cigarettes a day.  No open wounds at this time.   ABIs yesterday were 0.75 on the right and 0.71 on the left with evidence of moderate arterial disease       Past Medical History:  Diagnosis Date   Aortic anomaly      aortic calcification   Arrhythmia      a fib   Atrial fibrillation (HCC)     CAD (coronary artery disease)     CHF (congestive heart failure)  (HCC)     Chronic obstructive lung disease (HCC)     COPD (chronic obstructive pulmonary disease) ([place])     Diverticulosis 2010    colonoscopy 08-2008   DVT (deep venous thrombosis) (HCC)     GERD (gastroesophageal reflux disease)     Hemorrhoids     History of atrial fibrillation     Hypertension     Ischemic cardiomyopathy     Lung cancer ([place])      lung   On home oxygen therapy     Peripheral vascular disease ([place])     Stroke ([place])      oct 2012           Past Surgical History:  Procedure Laterality Date   PACEMAKER PLACEMENT        and defibrillator      History reviewed. No pertinent family history.   SOCIAL HISTORY: Social History         Socioeconomic History   Marital status: Married      Spouse name: Not on file   Number of children: Not on file   Years of education: Not on file   Highest education level: Not on file  Occupational History   Occupation: retired  Tobacco Use   Smoking status: Former      Packs/day: 0.10  Types: Cigarettes      Quit date: 12/2011      Years since quitting: 9.7   Smokeless tobacco: Never   Tobacco comments:      pt smokes 1 cigarrete occasionally. 1 pack "last a few months"  Vaping Use   Vaping Use: Every day   Start date: 04/14/2011   Substances: Nicotine  Substance and Sexual Activity   Alcohol use: Yes      Comment: occasional beer   Drug use: Never   Sexual activity: Not on file  Other Topics Concern   Not on file  Social History Narrative   Not on file    Social Determinants of Health    Financial Resource Strain: Not on file  Food Insecurity: Not on file  Transportation Needs: Not on file  Physical Activity: Not on file  Stress: Not on file  Social Connections: Not on file  Intimate Partner Violence: Not on file           Allergies  Allergen Reactions   Amoxicillin Shortness Of Breath      Has patient had a PCN reaction causing immediate rash, facial/tongue/throat swelling, SOB or  lightheadedness with hypotension: Yes Has patient had a PCN reaction causing severe rash involving mucus membranes or skin necrosis: Yes Has patient had a PCN reaction that required hospitalization: Yes Has patient had a PCN reaction occurring within the last 10 years: No If all of the above answers are "NO", then may proceed with Cephalosporin use.   Avelox [Moxifloxacin Hcl] Other (See Comments)      confusion            Current Outpatient Medications  Medication Sig Dispense Refill   acetaminophen (TYLENOL) 500 MG tablet Take 500 mg by mouth 2 (two) times daily.       albuterol (PROVENTIL) (2.5 MG/3ML) 0.083% nebulizer solution Take 2.5 mg by nebulization every 6 (six) hours as needed for wheezing or shortness of breath.       albuterol (VENTOLIN HFA) 108 (90 Base) MCG/ACT inhaler Inhale into the lungs as needed for wheezing or shortness of breath.       amiodarone (PACERONE) 200 MG tablet Take 2 tablets (400 mg total) by mouth 2 (two) times daily. 2 tab twice daily for 2 weeks, 1 tablet twice daily for two weeks then one tablet daily (Patient taking differently: Take 200 mg by mouth 2 (two) times daily. 2 tab twice daily for 2 weeks, 1 tablet twice daily for two weeks then one tablet daily) 90 tablet 1   apixaban (ELIQUIS) 5 MG TABS tablet Take 2.5 mg by mouth 2 (two) times daily.       furosemide (LASIX) 20 MG tablet Take 20 mg by mouth daily.        metoprolol succinate (TOPROL-XL) 25 MG 24 hr tablet Take 12.5 mg by mouth daily.        Mometasone Furoate 200 MCG/ACT AERO Inhale 2 puffs into the lungs at bedtime.       OLODATEROL HCL IN Inhale 2.5 mg into the lungs every morning. 2 puffs       omeprazole (PRILOSEC) 20 MG capsule Take 20 mg by mouth daily before breakfast.       OXYGEN Inhale 2 L/min into the lungs continuous.       pravastatin (PRAVACHOL) 40 MG tablet Take 40 mg by mouth at bedtime.       rOPINIRole (REQUIP) 0.25 MG tablet Take 0.25 mg by mouth 2 (two)  times daily.        sodium chloride 1 g tablet Take 1 g by mouth 3 (three) times daily.       diphenhydrAMINE (BENADRYL) 25 MG tablet Take 25 mg by mouth at bedtime. (Patient not taking: Reported on 08/18/2020)        No current facility-administered medications for this visit.      REVIEW OF SYSTEMS:  [X]  denotes positive finding, [ ]  denotes negative finding Cardiac   Comments:  Chest pain or chest pressure:      Shortness of breath upon exertion:      Short of breath when lying flat:      Irregular heart rhythm:             Vascular      Pain in calf, thigh, or hip brought on by ambulation:      Pain in feet at night that wakes you up from your sleep:  x Left foot  Blood clot in your veins:      Leg swelling:              Pulmonary      Oxygen at home:      Productive cough:       Wheezing:              Neurologic      Sudden weakness in arms or legs:       Sudden numbness in arms or legs:       Sudden onset of difficulty speaking or slurred speech:      Temporary loss of vision in one eye:       Problems with dizziness:              Gastrointestinal      Blood in stool:       Vomited blood:              Genitourinary      Burning when urinating:       Blood in urine:             Psychiatric      Major depression:              Hematologic      Bleeding problems:      Problems with blood clotting too easily:             Skin      Rashes or ulcers:             Constitutional      Fever or chills:          PHYSICAL EXAM:    Vitals:    09/11/21 0918  BP: (!) 146/84  Pulse: 77  Resp: 16  Temp: 98.1 F (36.7 C)  TempSrc: Temporal  SpO2: 94%  Weight: 120 lb (54.4 kg)  Height: 5\' 10"  (1.778 m)      GENERAL: The patient is a well-nourished male, in no acute distress. The vital signs are documented above. CARDIAC: There is a regular rate and rhythm.  VASCULAR:  Bilateral femoral pulses palpable No palpable pedal pulses PULMONARY: No respiratory distress. ABDOMEN: Soft  and non-tender. MUSCULOSKELETAL: There are no major deformities or cyanosis. NEUROLOGIC: No focal weakness or paresthesias are detected. SKIN: There are no ulcers or rashes noted. PSYCHIATRIC: The patient has a normal affect.   DATA:    ABIs yesterday were 0.75 on the right and 0.71 on the left with evidence of moderate  arterial disease   Assessment/Plan:   76 year old male history of atrial fibrillation on Eliquis, COPD, lung cancer, tobacco abuse that presents for evaluation of critical limb ischemia in the left lower extremity with rest pain.  He describes at least 10 years of rest pain.  His ABIs were severely depressed at the [place].  I have recommended aortogram with lower extremity arteriogram with a focus on the left leg through a transfemoral approach.  Discussed we will evaluate for any endovascular options in the left lower extremity.  May ultimately require open surgical bypass as I discussed with him.  Risk benefits discussed.  I offered to schedule this as early as Thursday and he will need to hold his Eliquis 48 hours.  He is going to check with his calendar and then our office will contact to schedule.     Brett Heck, MD [facility] [place] Office: [phone]

## 2021-10-01 NOTE — Progress Notes (Signed)
Dr. Smith Robert notified that patient has [employer] ICD and device orders were not received.  No new orders received at this time.

## 2021-10-01 NOTE — Anesthesia Procedure Notes (Signed)
Procedure Name: Intubation Date/Time: 10/01/2021 10:12 AM  Performed by: Eulas Post, Sharmarke Cicio W, CRNAPre-anesthesia Checklist: Patient identified, Emergency Drugs available, Suction available and Patient being monitored Patient Re-evaluated:Patient Re-evaluated prior to induction Oxygen Delivery Method: Circle system utilized Preoxygenation: Pre-oxygenation with 100% oxygen Induction Type: IV induction Ventilation: Mask ventilation without difficulty Laryngoscope Size: Miller and 2 Grade View: Grade I Tube type: Oral Tube size: 7.5 mm Number of attempts: 1 Airway Equipment and Method: Stylet and Oral airway Placement Confirmation: ETT inserted through vocal cords under direct vision, positive ETCO2 and breath sounds checked- equal and bilateral Secured at: 24 cm Tube secured with: Tape Dental Injury: Teeth and Oropharynx as per pre-operative assessment

## 2021-10-01 NOTE — Anesthesia Postprocedure Evaluation (Signed)
Anesthesia Post Note  Patient: [name]  Procedure(s) Performed: LEFT ILIOFEMORAL ENDARTERECTOMY WITH  1 cm X 6 cm BOVINE PATCH (Left: Groin)     Patient location during evaluation: PACU Anesthesia Type: General Level of consciousness: awake and alert Pain management: pain level controlled Vital Signs Assessment: post-procedure vital signs reviewed and stable Respiratory status: spontaneous breathing, nonlabored ventilation, respiratory function stable and patient connected to nasal cannula oxygen Cardiovascular status: blood pressure returned to baseline and stable Postop Assessment: no apparent nausea or vomiting Anesthetic complications: no   No notable events documented.  Last Vitals:  Vitals:   10/01/21 1600 10/01/21 1630  BP: (!) 121/51 (!) 118/41  Pulse:    Resp: 13 12  Temp:    SpO2:      Last Pain:  Vitals:   10/01/21 1347  TempSrc: Oral  PainSc: 0-No pain                 Brett Saunders

## 2021-10-01 NOTE — Progress Notes (Signed)
Pt ambulated With RN with a walker 385ft this evening  Pt tolerated well  Pt in room with call bell within reach

## 2021-10-01 NOTE — Anesthesia Procedure Notes (Signed)
Arterial Line Insertion Start/End7/17/2023 8:58 AM, 10/01/2021 9:04 AM Performed by: Lieutenant Diego, CRNA, CRNA  Patient location: Pre-op. Preanesthetic checklist: patient identified, IV checked, site marked, risks and benefits discussed, surgical consent, monitors and equipment checked, pre-op evaluation, timeout performed and anesthesia consent Lidocaine 1% used for infiltration Right, radial was placed Catheter size: 20 G  Attempts: 1 Procedure performed without using ultrasound guided technique. Following insertion, dressing applied and Biopatch. Post procedure assessment: normal  Patient tolerated the procedure well with no immediate complications.

## 2021-10-01 NOTE — Op Note (Signed)
Date: October 01, 2021  Preoperative diagnosis: Critical limb ischemia of the left lower extremity with rest pain  Postoperative diagnosis: Same  Procedure: Left iliofemoral endarterectomy including endarterectomy of the proximal profunda and SFA with bovine pericardial patch angioplasty  Surgeon: Dr. [name], MD  Assistant: Dr. Deitra Mayo, MD  Indications: 76 year old male who was seen as a referral from the [place] with critical limb ischemia and rest pain in the left foot.  He underwent angiogram that showed multiple high-grade calcified stenosis in the left common femoral artery and we recommended a left common femoral endarterectomy.  He presents today after risk benefits discussed.  An assistant was needed to expedite the case and to assist with the endarterectomy and sewing the patch.  Findings: Transverse incision in the left groin and after all the branches were controlled extensive endarterectomy of the left common femoral artery including the external iliac artery as well as the proximal profunda and SFA.  Excellent inflow.  A bovine pericardial patch was sewn to the left common femoral artery.  Excellent common femoral pulse at completion with brisk signal in the profunda and SFA.  DP doppler signal in the foot.  Anesthesia: General  Details: Patient was taken to the operating room after informed consent was obtained.  Placed on the operative table supine position.  General endotracheal anesthesia was induced.  The left groin was then prepped and draped in standard sterile fashion.  Antibiotics were given.  Timeout performed.  Initially made a transverse incision in the left groin over the common femoral artery.  Dissected down through the subcutaneous tissue with Bovie cautery and used cerebellar retractors for added visualization.  Femoral sheath was opened longitudinally.  Ultimately dissected under the inguinal ligament and got a nice soft spot for clamping on the more  proximal external iliac artery proximal to a large plaque.  We then dissected distally and got control of the SFA and profunda and all these were controlled with Vesseloops.  Patient was given 100 units/kg IV heparin.  Once ACT was therapeutic we controlled SFA and profunda with Vesseloops and the external iliac artery with a Henley clamp.  Arteriotomy was performed in the left common femoral artery with 11 blade scalpel extended with Potts scissors.  There was a large calcified high-grade stenotic plaque in the left common femoral artery and ultimately we used a Garment/textile technologist to perform extensive endarterectomy including of the external iliac artery and common femoral as well as the proximal profunda and SFA using the help of my assistant.  Ultimately we had good backbleeding from the SFA and profunda after completion of the endarterectomy.  A bovine pericardial patch was brought on the field and sewn in place with 5-0 Prolene parachute technique with the help of my assistant.  The patch was de-aired prior to completion.  Once we came off clamps we had a brisk triphasic signal in the profunda and a good signal in the SFA.  There was a signal in the left foot in the dorsalis pedis.  Protamine was given for reversal.  The groin was irrigated and closed in multiple layers of 2-0 Vicryl, 3-0 Vicryl, 4-0 Monocryl and Dermabond.  Taken to recovery in stable condition.  Complications: None  Condition: Stable  [name], MD [facility] [place] Office: [place]

## 2021-10-01 NOTE — Transfer of Care (Signed)
Immediate Anesthesia Transfer of Care Note  Patient: Brett Saunders  Procedure(s) Performed: LEFT ILIOFEMORAL ENDARTERECTOMY WITH  1 cm X 6 cm BOVINE PATCH (Left: Groin)  Patient Location: PACU  Anesthesia Type:General  Level of Consciousness: awake  Airway & Oxygen Therapy: Patient Spontanous Breathing and Patient connected to face mask oxygen  Post-op Assessment: Report given to RN and Post -op Vital signs reviewed and stable  Post vital signs: Reviewed and stable  Last Vitals:  Vitals Value Taken Time  BP    Temp    Pulse    Resp    SpO2      Last Pain:  Vitals:   10/01/21 0809  TempSrc:   PainSc: 1       Patients Stated Pain Goal: 3 (03/50/09 3818)  Complications: No notable events documented.

## 2021-10-02 ENCOUNTER — Encounter (HOSPITAL_COMMUNITY): Payer: Self-pay | Admitting: Vascular Surgery

## 2021-10-02 LAB — BASIC METABOLIC PANEL
Anion gap: 3 — ABNORMAL LOW (ref 5–15)
BUN: 9 mg/dL (ref 8–23)
CO2: 25 mmol/L (ref 22–32)
Calcium: 8.5 mg/dL — ABNORMAL LOW (ref 8.9–10.3)
Chloride: 103 mmol/L (ref 98–111)
Creatinine, Ser: 0.75 mg/dL (ref 0.61–1.24)
GFR, Estimated: >60 mL/min (ref >60)
Glucose, Bld: 117 mg/dL — ABNORMAL HIGH (ref 70–99)
Potassium: 4.4 mmol/L (ref 3.5–5.1)
Sodium: 131 mmol/L — ABNORMAL LOW (ref 135–145)

## 2021-10-02 LAB — CBC
HCT: 31.8 % — ABNORMAL LOW (ref 39.0–52.0)
Hemoglobin: 11.1 g/dL — ABNORMAL LOW (ref 13.0–17.0)
MCH: 32.6 pg (ref 26.0–34.0)
MCHC: 34.9 g/dL (ref 30.0–36.0)
MCV: 93.5 fL (ref 80.0–100.0)
Platelets: 145 10*3/uL — ABNORMAL LOW (ref 150–400)
RBC: 3.4 MIL/uL — ABNORMAL LOW (ref 4.22–5.81)
RDW: 13.2 % (ref 11.5–15.5)
WBC: 12.6 10*3/uL — ABNORMAL HIGH (ref 4.0–10.5)
nRBC: 0 % (ref 0.0–0.2)

## 2021-10-02 MED ORDER — APIXABAN 2.5 MG PO TABS
2.5000 mg | ORAL_TABLET | Freq: Two times a day (BID) | ORAL | Status: DC
Start: 2021-10-02 — End: 2021-10-02
  Administered 2021-10-02: 2.5 mg via ORAL
  Filled 2021-10-02: qty 1

## 2021-10-02 MED ORDER — TRAMADOL HCL 50 MG PO TABS: 50.0000 mg | ORAL_TABLET | Freq: Four times a day (QID) | ORAL | 0 refills | Status: DC | PRN

## 2021-10-02 MED ORDER — ASPIRIN 81 MG PO TBEC: 81.0000 mg | DELAYED_RELEASE_TABLET | Freq: Every day | ORAL | Status: DC

## 2021-10-02 NOTE — Plan of Care (Signed)
  Problem: Education: Goal: Understanding of CV disease, CV risk reduction, and recovery process will improve Outcome: Adequate for Discharge Goal: Individualized Educational Video(s) Outcome: Adequate for Discharge   Problem: Activity: Goal: Ability to return to baseline activity level will improve Outcome: Adequate for Discharge   Problem: Cardiovascular: Goal: Ability to achieve and maintain adequate cardiovascular perfusion will improve Outcome: Adequate for Discharge Goal: Vascular access site(s) Level 0-1 will be maintained Outcome: Adequate for Discharge   Problem: Health Behavior/Discharge Planning: Goal: Ability to safely manage health-related needs after discharge will improve Outcome: Adequate for Discharge   Problem: Pain Managment: Goal: General experience of comfort will improve Outcome: Adequate for Discharge   Problem: Skin Integrity: Goal: Risk for impaired skin integrity will decrease Outcome: Adequate for Discharge

## 2021-10-02 NOTE — Progress Notes (Addendum)
  Progress Note    10/02/2021 6:46 AM 1 Day Post-Op  Subjective:  says he has no complaints.  He has walked.  He is on O2 at home at night and when he goes out.  Walked with his walker.   Afebrile HR 60's-80's  599'H-741'S systolic 23% [id number]  Vitals:   10/02/21 0022 10/02/21 0405  BP: (!) 109/54 (!) 106/54  Pulse: 79 70  Resp: 19 19  Temp: 97.6 F (36.4 C) 97.7 F (36.5 C)  SpO2: 97% 97%    Physical Exam: Cardiac:  regular Lungs:  non labored on O2 Incisions:  clean and dry Extremities:  +palpable left DP and +doppler signals left DP/PT; left calf is soft and non tender   CBC    Component Value Date/Time   WBC 12.6 (H) 10/02/2021 0405   RBC 3.40 (L) 10/02/2021 0405   HGB 11.1 (L) 10/02/2021 0405   HCT 31.8 (L) 10/02/2021 0405   PLT 145 (L) 10/02/2021 0405   MCV 93.5 10/02/2021 0405   MCH 32.6 10/02/2021 0405   MCHC 34.9 10/02/2021 0405   RDW 13.2 10/02/2021 0405    BMET    Component Value Date/Time   NA 131 (L) 10/02/2021 0405   K 4.4 10/02/2021 0405   CL 103 10/02/2021 0405   CO2 25 10/02/2021 0405   GLUCOSE 117 (H) 10/02/2021 0405   BUN 9 10/02/2021 0405   CREATININE 0.75 10/02/2021 0405   CALCIUM 8.5 (L) 10/02/2021 0405   GFRNONAA >60 10/02/2021 0405   GFRAA 60 (L) 12/23/2018 0448    INR    Component Value Date/Time   INR 1.1 09/25/2021 1104     Intake/Output Summary (Last 24 hours) at 10/02/2021 0646 Last data filed at 10/01/2021 2143 Gross per 24 hour  Intake 1693.97 ml  Output --  Net 1693.97 ml     Assessment/Plan:  76 y.o. male is s/p:  Left iliofemoral endarterectomy including endarterectomy of the proximal profunda and SFA with bovine pericardial patch angioplasty  1 Day Post-Op   -pt doing well with doppler signals left foot -no evidence of compartment syndrome -on O2 at home at night and when he goes out. -DVT prophylaxis:  will restart Eliquis today -continue to mobilize today -discussed groin wound care with  pt   Leontine Locket, PA-C [facility] [phone] 10/02/2021 6:46 AM   I have seen and evaluated the patient. I agree with the PA note as documented above.  Postop day 1 status post left common femoral endarterectomy with bovine pericardial patch for critical limb ischemia with rest pain in the left foot.  States his rest pain in the left foot is much improved.  He has ambulated in the hall.  Wants to go home today.  Plan discharge.  We will resume his Eliquis for A-fib.  We will add aspirin statin at discharge.  Follow-up in 2 to 3 weeks in the office.  Discussed he call with questions or concerns  [name], MD [facility] [place] Office: [phone]

## 2021-10-02 NOTE — Discharge Summary (Signed)
Discharge Summary     Brett Saunders 02/03/1946 76 y.o. male  [id number]  Admission Date: 10/01/2021  Discharge Date: 10/02/2021  Physician: [name], MD  Admission Diagnosis: PAD (peripheral artery disease) ([place]) [I73.9]  HPI:   This is a 76 y.o. male  with history of atrial fibrillation on Eliquis, COPD, lung cancer, tobacco abuse that presents for evaluation of critical limb ischemia in the left lower extremity with rest pain.  Patient describes at least 10 years of pain in his left foot.  He has numbness associated with this and often cannot sleep at night.  He has been evaluated at the [facility] by Dr. Nicola Girt and then was sent here for further evaluation.  His studies at the [place] showed an ABI of 0.6 on the right and 0.55 on the left.  Evidence of moderate peripheral vascular disease.  He states no previous interventions.  Still smoking several cigarettes a day.  No open wounds at this time.   ABIs yesterday were 0.75 on the right and 0.71 on the left with evidence of moderate arterial disease  Hospital Course:  The patient was admitted to the hospital and taken to the operating room on 10/01/2021 and underwent: Left iliofemoral endarterectomy including endarterectomy of the proximal profunda and SFA with bovine pericardial patch angioplasty    Findings: Transverse incision in the left groin and after all the branches were controlled extensive endarterectomy of the left common femoral artery including the external iliac artery as well as the proximal profunda and SFA.  Excellent inflow.  A bovine pericardial patch was sewn to the left common femoral artery.  Excellent common femoral pulse at completion with brisk signal in the profunda and SFA.  DP doppler signal in the foot.  The pt tolerated the procedure well and was transported to the PACU in good condition.   By POD 1, he had a palpable left DP pulse and doppler signals left DP/PT.  His calf was soft and non tender. His  eliquis was restarted for his afib.  He was discharged home.    CBC    Component Value Date/Time   WBC 12.6 (H) 10/02/2021 0405   RBC 3.40 (L) 10/02/2021 0405   HGB 11.1 (L) 10/02/2021 0405   HCT 31.8 (L) 10/02/2021 0405   PLT 145 (L) 10/02/2021 0405   MCV 93.5 10/02/2021 0405   MCH 32.6 10/02/2021 0405   MCHC 34.9 10/02/2021 0405   RDW 13.2 10/02/2021 0405    BMET    Component Value Date/Time   NA 131 (L) 10/02/2021 0405   K 4.4 10/02/2021 0405   CL 103 10/02/2021 0405   CO2 25 10/02/2021 0405   GLUCOSE 117 (H) 10/02/2021 0405   BUN 9 10/02/2021 0405   CREATININE 0.75 10/02/2021 0405   CALCIUM 8.5 (L) 10/02/2021 0405   GFRNONAA >60 10/02/2021 0405   GFRAA 60 (L) 12/23/2018 0448     Discharge Instructions     Discharge patient   Complete by: As directed    Discharge disposition: 01-Home or Self Care   Discharge patient date: 10/02/2021       Discharge Diagnosis:  PAD (peripheral artery disease) ([place]) [I73.9]  Secondary Diagnosis: Patient Active Problem List   Diagnosis Date Noted   PAD (peripheral artery disease) ([place]) 10/01/2021   Critical limb ischemia of left lower extremity ([place]) 09/11/2021   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   Past Medical History:  Diagnosis Date   Aortic anomaly    aortic calcification  Arrhythmia    a fib   Atrial fibrillation (HCC)    CAD (coronary artery disease)    CHF (congestive heart failure) (HCC)    Chronic obstructive lung disease (HCC)    COPD (chronic obstructive pulmonary disease) ([place])    Diverticulosis 2010   colonoscopy 08-2008   DVT (deep venous thrombosis) (HCC)    GERD (gastroesophageal reflux disease)    Hemorrhoids    History of atrial fibrillation    Hypertension    Ischemic cardiomyopathy    Lung cancer ([place])    lung   On home oxygen therapy    Peripheral vascular disease ([place])    Stroke ([place])    oct 2012     Allergies as of 10/02/2021       Reactions    Amoxicillin Shortness Of Breath   Has patient had a PCN reaction causing immediate rash, facial/tongue/throat swelling, SOB or lightheadedness with hypotension: Yes Has patient had a PCN reaction causing severe rash involving mucus membranes or skin necrosis: Yes Has patient had a PCN reaction that required hospitalization: Yes Has patient had a PCN reaction occurring within the last 10 years: No If all of the above answers are "NO", then may proceed with Cephalosporin use.   Avelox [moxifloxacin Hcl] Other (See Comments)   confusion        Medication List     TAKE these medications    acetaminophen 500 MG tablet Commonly known as: TYLENOL Take 500 mg by mouth at bedtime.   albuterol (2.5 MG/3ML) 0.083% nebulizer solution Commonly known as: PROVENTIL Take 2.5 mg by nebulization daily.   albuterol 108 (90 Base) MCG/ACT inhaler Commonly known as: VENTOLIN HFA Inhale 2 puffs into the lungs every 4 (four) hours as needed for wheezing or shortness of breath.   amiodarone 200 MG tablet Commonly known as: PACERONE Take 2 tablets (400 mg total) by mouth 2 (two) times daily. 2 tab twice daily for 2 weeks, 1 tablet twice daily for two weeks then one tablet daily What changed:  how much to take when to take this additional instructions   apixaban 5 MG Tabs tablet Commonly known as: ELIQUIS Take 2.5 mg by mouth 2 (two) times daily.   aspirin EC 81 MG tablet Take 1 tablet (81 mg total) by mouth daily. Swallow whole.   furosemide 20 MG tablet Commonly known as: LASIX Take 20 mg by mouth daily.   metoprolol succinate 25 MG 24 hr tablet Commonly known as: TOPROL-XL Take 12.5 mg by mouth daily.   Mometasone Furoate 200 MCG/ACT Aero Inhale 2 puffs into the lungs at bedtime. Asmanex   Olodaterol HCl 2.5 MCG/ACT Aers Inhale 2 puffs into the lungs every morning. 2 puffs   omeprazole 20 MG capsule Commonly known as: PRILOSEC Take 20 mg by mouth daily as needed (Heartburn).    OXYGEN Inhale 2 L/min into the lungs at bedtime. Portable during the day   pravastatin 40 MG tablet Commonly known as: PRAVACHOL Take 40 mg by mouth at bedtime.   rOPINIRole 0.25 MG tablet Commonly known as: REQUIP Take 0.25 mg by mouth 2 (two) times daily.   sodium chloride 1 g tablet Take 1 g by mouth 3 (three) times daily.   traMADol 50 MG tablet Commonly known as: Ultram Take 1 tablet (50 mg total) by mouth every 6 (six) hours as needed.        Discharge Instructions: [facility] Discharge instructions Lower Extremity Bypass Surgery  Please  refer to the following instruction for your post-procedure care. Your surgeon or physician assistant will discuss any changes with you.  Activity  You are encouraged to walk as much as you can. You can slowly return to normal activities during the month after your surgery. Avoid strenuous activity and heavy lifting until your doctor tells you it's OK. Avoid activities such as vacuuming or swinging a golf club. Do not drive until your doctor give the OK and you are no longer taking prescription pain medications. It is also normal to have difficulty with sleep habits, eating and bowel movement after surgery. These will go away with time.  Bathing/Showering  You may shower after you go home. Do not soak in a bathtub, hot tub, or swim until the incision heals completely.  Incision Care  Clean your incision with mild soap and water. Shower every day. Pat the area dry with a clean towel. You do not need a bandage unless otherwise instructed. Do not apply any ointments or creams to your incision. If you have open wounds you will be instructed how to care for them or a visiting nurse may be arranged for you. If you have staples or sutures along your incision they will be removed at your post-op appointment. You may have skin glue on your incision. Do not peel it off. It will come off on its own in about one  week.  Wash the groin wound with soap and water daily and pat dry. (No tub bath-only shower)  Then put a dry gauze or washcloth in the groin to keep this area dry to help prevent wound infection.  Do this daily and as needed.  Do not use Vaseline or neosporin on your incisions.  Only use soap and water on your incisions and then protect and keep dry.  Diet  Resume your normal diet. There are no special food restrictions following this procedure. A low fat/ low cholesterol diet is recommended for all patients with vascular disease. In order to heal from your surgery, it is CRITICAL to get adequate nutrition. Your body requires vitamins, minerals, and protein. Vegetables are the best source of vitamins and minerals. Vegetables also provide the perfect balance of protein. Processed food has little nutritional value, so try to avoid this.  Medications  Resume taking all your medications unless your doctor or Physician Assistant tells you not to. If your incision is causing pain, you may take over-the-counter pain relievers such as acetaminophen (Tylenol). If you were prescribed a stronger pain medication, please aware these medication can cause nausea and constipation. Prevent nausea by taking the medication with a snack or meal. Avoid constipation by drinking plenty of fluids and eating foods with high amount of fiber, such as fruits, vegetables, and grains. Take Colace 100 mg (an over-the-counter stool softener) twice a day as needed for constipation.  Do not take Tylenol if you are taking prescription pain medications.  Follow Up  Our office will schedule a follow up appointment 2-3 weeks following discharge.  Please call us immediately for any of the following conditions  Severe or worsening pain in your legs or feet while at rest or while walking Increase pain, redness, warmth, or drainage (pus) from your incision site(s) Fever of 101 degree or higher The swelling in your leg with the bypass  suddenly worsens and becomes more painful than when you were in the hospital If you have been instructed to feel your graft pulse then you should do so every day. If  you can no longer feel this pulse, call the office immediately. Not all patients are given this instruction.  Leg swelling is common after leg bypass surgery.  The swelling should improve over a few months following surgery. To improve the swelling, you may elevate your legs above the level of your heart while you are sitting or resting. Your surgeon or physician assistant may ask you to apply an ACE wrap or wear compression (TED) stockings to help to reduce swelling.  Reduce your risk of vascular disease  Stop smoking. If you would like help call [facility] at 1-800-QUIT-NOW 781-130-4483) or [place] at [phone].  Manage your cholesterol Maintain a desired weight Control your diabetes weight Control your diabetes Keep your blood pressure down  If you have any questions, please call the office at [phone]   Prescriptions given: 1.  Tramadol#20 No Refill 2.  Aspirin 81mg  daily OTC  Disposition: home  Patient's condition: is Good  Follow up: 1. [facility] in 2-3 weeks   Leontine Locket, PA-C [facility] [phone] 10/02/2021  8:27 AM  - For VQI Registry use ---   Post-op:  Wound infection: No  Graft infection: No  Transfusion: No    If yes, n/a units given New Arrhythmia: No Ipsilateral amputation: No, [ ]  Minor, [ ]  BKA, [ ]  AKA Discharge patency: [x ] Primary, [ ]  Primary assisted, [ ]  Secondary, [ ]  Occluded Patency judged by: [ ]  Dopper only, [ ]  Palpable graft pulse, [x]  Palpable distal pulse, [ ]  ABI inc. > 0.15, [ ]  Duplex Discharge ABI: R not done, L  D/C Ambulatory Status: Ambulatory  Complications: MI: No, [ ]  Troponin only, [ ]  EKG or Clinical CHF: No Resp failure:No, [ ]  Pneumonia, [ ]  Ventilator Chg in renal function: No, [ ]  Inc. Cr > 0.5, [ ]  Temp. Dialysis,  [  ] Permanent dialysis Stroke: No, [ ]  Minor, [ ]  Major Return to OR: No  Reason for return to OR: [ ]  Bleeding, [ ]  Infection, [ ]  Thrombosis, [ ]  Revision  Discharge medications: Statin use:  yes ASA use:  yes Plavix use:  no Beta blocker use: yes CCB use:  No ACEI use:   no ARB use:  no Coumadin use: no Eliquis:  yes

## 2021-10-02 NOTE — Discharge Instructions (Signed)
 [facility] [place]  Discharge instructions  Lower Extremity Bypass Surgery  Please refer to the following instruction for your post-procedure care. Your surgeon or physician assistant will discuss any changes with you.  Activity  You are encouraged to walk as much as you can. You can slowly return to normal activities during the month after your surgery. Avoid strenuous activity and heavy lifting until your doctor tells you it's OK. Avoid activities such as vacuuming or swinging a golf club. Do not drive until your doctor give the OK and you are no longer taking prescription pain medications. It is also normal to have difficulty with sleep habits, eating and bowel movement after surgery. These will go away with time.  Bathing/Showering  Shower daily after you go home. Do not soak in a bathtub, hot tub, or swim until the incision heals completely.  Incision Care  Clean your incision with mild soap and water. Shower every day. Pat the area dry with a clean towel. You do not need a bandage unless otherwise instructed. Do not apply any ointments or creams to your incision. If you have open wounds you will be instructed how to care for them or a visiting nurse may be arranged for you. If you have staples or sutures along your incision they will be removed at your post-op appointment. You may have skin glue on your incision. Do not peel it off. It will come off on its own in about one week.  Wash the groin wound with soap and water daily and pat dry. (No tub bath-only shower)  Then put a dry gauze or washcloth in the groin to keep this area dry to help prevent wound infection.  Do this daily and as needed.  Do not use Vaseline or neosporin on your incisions.  Only use soap and water on your incisions and then protect and keep dry.  Diet  Resume your normal diet. There are no special food restrictions following this procedure. A low fat/ low cholesterol diet is  recommended for all patients with vascular disease. In order to heal from your surgery, it is CRITICAL to get adequate nutrition. Your body requires vitamins, minerals, and protein. Vegetables are the best source of vitamins and minerals. Vegetables also provide the perfect balance of protein. Processed food has little nutritional value, so try to avoid this.  Medications  Resume taking all your medications unless your doctor or physician assistant tells you not to. If your incision is causing pain, you may take over-the-counter pain relievers such as acetaminophen (Tylenol). If you were prescribed a stronger pain medication, please aware these medication can cause nausea and constipation. Prevent nausea by taking the medication with a snack or meal. Avoid constipation by drinking plenty of fluids and eating foods with high amount of fiber, such as fruits, vegetables, and grains. Take Colace 100 mg (an over-the-counter stool softener) twice a day as needed for constipation.  Do not take Tylenol if you are taking prescription pain medications.  Follow Up  Our office will schedule a follow up appointment 2-3 weeks following discharge.  Please call us immediately for any of the following conditions  Severe or worsening pain in your legs or feet while at rest or while walking Increase pain, redness, warmth, or drainage (pus) from your incision site(s) Fever of 101 degree or higher The swelling in your leg with the bypass suddenly worsens and becomes more painful than when you were in the hospital If you have   been instructed to feel your graft pulse then you should do so every day. If you can no longer feel this pulse, call the office immediately. Not all patients are given this instruction.  Leg swelling is common after leg bypass surgery.  The swelling should improve over a few months following surgery. To improve the swelling, you may elevate your legs above the level of your heart while you are  sitting or resting. Your surgeon or physician assistant may ask you to apply an ACE wrap or wear compression (TED) stockings to help to reduce swelling.  Reduce your risk of vascular disease  Stop smoking. If you would like help call [facility] at 1-800-QUIT-NOW (1-800-784-8669) or [place] at [phone].  Manage your cholesterol Maintain a desired weight Control your diabetes weight Control your diabetes Keep your blood pressure down  If you have any questions, please call the office at [phone]  

## 2021-10-04 ENCOUNTER — Telehealth: Payer: Self-pay

## 2021-10-04 NOTE — Telephone Encounter (Signed)
Pt called stating that he had called the after hours line and spoke with the doctor. The redness on his penis is worse. He wanted some reassurance and he just took his wife to the ED "because she may be having a heart attack".   Reviewed pt's chart, returned pt's call for clarification, two identifiers used. Pt stated that the redness started at the base was only about 1/3 of the way up the shaft yesterday. Now, his penis is red all the way up to the glans and there is some puffiness just under the skin. He denies any pain or trouble urinating. He states he did have a catheter during the hospital visit. Instructed pt to monitor and call for worsening symptoms. If the groin site started to swell or any redness increased, he is to seek emergent care. Confirmed understanding.

## 2021-10-12 ENCOUNTER — Telehealth: Payer: Self-pay

## 2021-10-12 NOTE — Telephone Encounter (Signed)
Pt called stating that he has a lump in his groin along the artery causing concern.  Reviewed pt's chart, returned pt's call, two identifiers used. Pt stated that the area is approx 5-6" in length and is hardened. He has no other complaints. He denies severe pain, swelling, or discoloration. Pt thinks it may be the incision healing or a former wound he experienced. The incision itself is closed with no drainage or redness. Pt instructed to monitor and to call after hours or go to ED if necessary. Confirmed understanding.

## 2021-10-23 ENCOUNTER — Ambulatory Visit (INDEPENDENT_AMBULATORY_CARE_PROVIDER_SITE_OTHER): Payer: No Typology Code available for payment source | Admitting: Vascular Surgery

## 2021-10-23 ENCOUNTER — Encounter: Payer: Self-pay | Admitting: Vascular Surgery

## 2021-10-23 VITALS — BP 141/69 | HR 69 | Temp 97.7°F | Resp 16 | Ht 70.0 in | Wt 120.0 lb

## 2021-10-23 DIAGNOSIS — I70222 Atherosclerosis of native arteries of extremities with rest pain, left leg: Secondary | ICD-10-CM

## 2021-10-23 NOTE — Progress Notes (Signed)
Patient name: Brett Saunders MRN: [id number] DOB: Aug 03, 1945 Sex: male  REASON FOR VISIT: Post-op  HPI: Brett Saunders is a 76 y.o. male with multiple medical co-morbidities that presents for postop check after left common femoral endarterectomy with bovine patch on 10/01/2021 for critical limb ischemia with rest pain.  Rest pain in his foot has significantly improved.  He did have some swelling in his scrotum that has also improved.  Incision is healing without issue.  No new complaints today.  No problem with the right leg.  Past Medical History:  Diagnosis Date   Aortic anomaly    aortic calcification   Arrhythmia    a fib   Atrial fibrillation (HCC)    CAD (coronary artery disease)    CHF (congestive heart failure) (HCC)    Chronic obstructive lung disease (HCC)    COPD (chronic obstructive pulmonary disease) ([place])    Diverticulosis 2010   colonoscopy 08-2008   DVT (deep venous thrombosis) (HCC)    GERD (gastroesophageal reflux disease)    Hemorrhoids    History of atrial fibrillation    Hypertension    Ischemic cardiomyopathy    Lung cancer ([place])    lung   On home oxygen therapy    Peripheral vascular disease ([place])    Stroke ([place])    oct 2012    Past Surgical History:  Procedure Laterality Date   ABDOMINAL AORTOGRAM W/LOWER EXTREMITY N/A 09/13/2021   Procedure: ABDOMINAL AORTOGRAM W/LOWER EXTREMITY;  Surgeon: [name], MD;  Location: [facility];  Service: Cardiovascular;  Laterality: N/A;   CORONARY ANGIOGRAM  2013   After stroke-[facility], [place].   ENDARTERECTOMY Left 10/01/2021   Procedure: LEFT ILIOFEMORAL ENDARTERECTOMY WITH  1 cm X 6 cm BOVINE PATCH;  Surgeon: [name], MD;  Location: [facility];  Service: Vascular;  Laterality: Left;   PACEMAKER PLACEMENT     and defibrillator    History reviewed. No pertinent family history.  SOCIAL HISTORY: Social History   Tobacco Use   Smoking status: Some Days     Packs/day: 0.10    Types: Cigarettes   Smokeless tobacco: Never   Tobacco comments:    pt smokes 1 cigarrete occasionally. 1 pack "last a few months"  Substance Use Topics   Alcohol use: Yes    Comment: occasional beer    Allergies  Allergen Reactions   Amoxicillin Shortness Of Breath    Has patient had a PCN reaction causing immediate rash, facial/tongue/throat swelling, SOB or lightheadedness with hypotension: Yes Has patient had a PCN reaction causing severe rash involving mucus membranes or skin necrosis: Yes Has patient had a PCN reaction that required hospitalization: Yes Has patient had a PCN reaction occurring within the last 10 years: No If all of the above answers are "NO", then may proceed with Cephalosporin use.   Avelox [Moxifloxacin Hcl] Other (See Comments)    confusion    Current Outpatient Medications  Medication Sig Dispense Refill   acetaminophen (TYLENOL) 500 MG tablet Take 500 mg by mouth at bedtime.     albuterol (PROVENTIL) (2.5 MG/3ML) 0.083% nebulizer solution Take 2.5 mg by nebulization daily.     albuterol (VENTOLIN HFA) 108 (90 Base) MCG/ACT inhaler Inhale 2 puffs into the lungs every 4 (four) hours as needed for wheezing or shortness of breath.     amiodarone (PACERONE) 200 MG tablet Take 2 tablets (400 mg total) by mouth 2 (two) times daily. 2 tab twice daily for 2  weeks, 1 tablet twice daily for two weeks then one tablet daily (Patient taking differently: Take 100 mg by mouth every morning.) 90 tablet 1   apixaban (ELIQUIS) 5 MG TABS tablet Take 2.5 mg by mouth 2 (two) times daily.     aspirin EC 81 MG tablet Take 1 tablet (81 mg total) by mouth daily. Swallow whole.     furosemide (LASIX) 20 MG tablet Take 20 mg by mouth daily.      metoprolol succinate (TOPROL-XL) 25 MG 24 hr tablet Take 12.5 mg by mouth daily.      Mometasone Furoate 200 MCG/ACT AERO Inhale 2 puffs into the lungs at bedtime. Asmanex     Olodaterol HCl 2.5 MCG/ACT AERS Inhale 2 puffs  into the lungs every morning. 2 puffs     omeprazole (PRILOSEC) 20 MG capsule Take 20 mg by mouth daily as needed (Heartburn).     OXYGEN Inhale 2 L/min into the lungs at bedtime. Portable during the day     pravastatin (PRAVACHOL) 40 MG tablet Take 40 mg by mouth at bedtime.     rOPINIRole (REQUIP) 0.25 MG tablet Take 0.25 mg by mouth 2 (two) times daily.     sodium chloride 1 g tablet Take 1 g by mouth 3 (three) times daily.     traMADol (ULTRAM) 50 MG tablet Take 1 tablet (50 mg total) by mouth every 6 (six) hours as needed. 20 tablet 0   No current facility-administered medications for this visit.    REVIEW OF SYSTEMS:  [X]  denotes positive finding, [ ]  denotes negative finding Cardiac  Comments:  Chest pain or chest pressure:    Shortness of breath upon exertion:    Short of breath when lying flat:    Irregular heart rhythm:        Vascular    Pain in calf, thigh, or hip brought on by ambulation:    Pain in feet at night that wakes you up from your sleep:     Blood clot in your veins:    Leg swelling:         Pulmonary    Oxygen at home:    Productive cough:     Wheezing:         Neurologic    Sudden weakness in arms or legs:     Sudden numbness in arms or legs:     Sudden onset of difficulty speaking or slurred speech:    Temporary loss of vision in one eye:     Problems with dizziness:         Gastrointestinal    Blood in stool:     Vomited blood:         Genitourinary    Burning when urinating:     Blood in urine:        Psychiatric    Major depression:         Hematologic    Bleeding problems:    Problems with blood clotting too easily:        Skin    Rashes or ulcers:        Constitutional    Fever or chills:      PHYSICAL EXAM: Vitals:   10/23/21 0855 10/23/21 0857  BP: (!) 149/81 (!) 141/69  Pulse: 85 69  Resp: 16   Temp: 97.7 F (36.5 C)   TempSrc: Temporal   SpO2: 98%   Weight: 120 lb (54.4 kg)   Height: 5\' 10"  (1.778 m)  GENERAL: The patient is a well-nourished male, in no acute distress. The vital signs are documented above. CARDIAC: There is a regular rate and rhythm.  VASCULAR:  Left groin incision healing without issue, no drainage or erythema Brisk left DP PT Doppler signals with no tissue loss  DATA:   N/A  Assessment/Plan:  76 year old male status post left common femoral endarterectomy with bovine patch on 10/01/2021 for critical limb ischemia with rest pain in the left foot.  His groin incision is healed without issue.  He has an excellent femoral pulse palpable on exam.  He has brisk Doppler signals in his left foot.  Rest pain is resolved.  I will see him in 6 months with ABIs.  Happy with his progress.  Discussed he call with questions or concerns.    [name], MD [facility] Office: [phone]

## 2021-10-26 ENCOUNTER — Other Ambulatory Visit: Payer: Self-pay

## 2021-10-26 DIAGNOSIS — I70223 Atherosclerosis of native arteries of extremities with rest pain, bilateral legs: Secondary | ICD-10-CM

## 2021-10-26 NOTE — Progress Notes (Signed)
vas 

## 2021-11-11 ENCOUNTER — Encounter (HOSPITAL_COMMUNITY): Payer: Self-pay

## 2021-11-11 ENCOUNTER — Emergency Department (HOSPITAL_COMMUNITY): Payer: No Typology Code available for payment source

## 2021-11-11 ENCOUNTER — Other Ambulatory Visit: Payer: Self-pay

## 2021-11-11 ENCOUNTER — Emergency Department (HOSPITAL_COMMUNITY)
Admission: EM | Admit: 2021-11-11 | Discharge: 2021-11-12 | Discharge status: Against Medical Advice | Payer: No Typology Code available for payment source | Attending: Emergency Medicine | Admitting: Emergency Medicine

## 2021-11-11 DIAGNOSIS — J449 Chronic obstructive pulmonary disease, unspecified: Secondary | ICD-10-CM | POA: Diagnosis not present

## 2021-11-11 DIAGNOSIS — Z5321 Procedure and treatment not carried out due to patient leaving prior to being seen by health care provider: Secondary | ICD-10-CM | POA: Diagnosis not present

## 2021-11-11 DIAGNOSIS — R053 Chronic cough: Secondary | ICD-10-CM | POA: Insufficient documentation

## 2021-11-11 DIAGNOSIS — Z20822 Contact with and (suspected) exposure to covid-19: Secondary | ICD-10-CM | POA: Diagnosis not present

## 2021-11-11 DIAGNOSIS — M25512 Pain in left shoulder: Secondary | ICD-10-CM | POA: Diagnosis not present

## 2021-11-11 DIAGNOSIS — R42 Dizziness and giddiness: Secondary | ICD-10-CM | POA: Insufficient documentation

## 2021-11-11 DIAGNOSIS — R0602 Shortness of breath: Secondary | ICD-10-CM | POA: Insufficient documentation

## 2021-11-11 DIAGNOSIS — R531 Weakness: Secondary | ICD-10-CM | POA: Diagnosis not present

## 2021-11-11 LAB — I-STAT VENOUS BLOOD GAS, ED
Acid-Base Excess: 6 mmol/L — ABNORMAL HIGH (ref 0.0–2.0)
Bicarbonate: 32.8 mmol/L — ABNORMAL HIGH (ref 20.0–28.0)
Calcium, Ion: 1.21 mmol/L (ref 1.15–1.40)
HCT: 37 % — ABNORMAL LOW (ref 39.0–52.0)
Hemoglobin: 12.6 g/dL — ABNORMAL LOW (ref 13.0–17.0)
O2 Saturation: 51 %
Potassium: 3.6 mmol/L (ref 3.5–5.1)
Sodium: 130 mmol/L — ABNORMAL LOW (ref 135–145)
TCO2: 35 mmol/L — ABNORMAL HIGH (ref 22–32)
pCO2, Ven: 59 mmHg (ref 44–60)
pH, Ven: 7.353 (ref 7.25–7.43)
pO2, Ven: 30 mmHg — CL (ref 32–45)

## 2021-11-11 LAB — BASIC METABOLIC PANEL
Anion gap: 7 (ref 5–15)
BUN: 6 mg/dL — ABNORMAL LOW (ref 8–23)
CO2: 29 mmol/L (ref 22–32)
Calcium: 9.1 mg/dL (ref 8.9–10.3)
Chloride: 94 mmol/L — ABNORMAL LOW (ref 98–111)
Creatinine, Ser: 0.8 mg/dL (ref 0.61–1.24)
GFR, Estimated: >60 mL/min (ref >60)
Glucose, Bld: 128 mg/dL — ABNORMAL HIGH (ref 70–99)
Potassium: 3.8 mmol/L (ref 3.5–5.1)
Sodium: 130 mmol/L — ABNORMAL LOW (ref 135–145)

## 2021-11-11 LAB — CBC WITH DIFFERENTIAL/PLATELET
Abs Immature Granulocytes: 0.03 10*3/uL (ref 0.00–0.07)
Basophils Absolute: 0.1 10*3/uL (ref 0.0–0.1)
Basophils Relative: 1 %
Eosinophils Absolute: 0.2 10*3/uL (ref 0.0–0.5)
Eosinophils Relative: 2 %
HCT: 35.1 % — ABNORMAL LOW (ref 39.0–52.0)
Hemoglobin: 11.6 g/dL — ABNORMAL LOW (ref 13.0–17.0)
Immature Granulocytes: 0 %
Lymphocytes Relative: 5 %
Lymphs Abs: 0.4 10*3/uL — ABNORMAL LOW (ref 0.7–4.0)
MCH: 31.2 pg (ref 26.0–34.0)
MCHC: 33 g/dL (ref 30.0–36.0)
MCV: 94.4 fL (ref 80.0–100.0)
Monocytes Absolute: 0.7 10*3/uL (ref 0.1–1.0)
Monocytes Relative: 7 %
Neutro Abs: 7.6 10*3/uL (ref 1.7–7.7)
Neutrophils Relative %: 85 %
Platelets: 168 10*3/uL (ref 150–400)
RBC: 3.72 MIL/uL — ABNORMAL LOW (ref 4.22–5.81)
RDW: 13.4 % (ref 11.5–15.5)
WBC: 8.9 10*3/uL (ref 4.0–10.5)
nRBC: 0 % (ref 0.0–0.2)

## 2021-11-11 LAB — RESP PANEL BY RT-PCR (FLU A&B, COVID) ARPGX2
Influenza A by PCR: NEGATIVE
Influenza B by PCR: NEGATIVE
SARS Coronavirus 2 by RT PCR: NEGATIVE

## 2021-11-11 LAB — TROPONIN I (HIGH SENSITIVITY): Troponin I (High Sensitivity): 13 ng/L (ref <18)

## 2021-11-11 NOTE — ED Triage Notes (Signed)
Pt with SOB , generalized weakness, and dizziness starting Friday afternoon. Pt also off balance when walking. Pt on 2lnc but had to increase to 3 lnc. Pt also c/o chest pain and left shoulder pain. Pt taking tylenol and pain is better.

## 2021-11-11 NOTE — ED Provider Triage Note (Signed)
Emergency Medicine Provider Triage Evaluation Note  Brett Saunders , a 76 y.o. male  was evaluated in triage.  Pt complains of shortness of breath, weakness, and dizziness for 3 days. States that he has COPD and is always short of breath. Has not had to increase his O2. Has chronic cough that has not worsened. No new production. He describes feeling generally weak and having left shoulder pain. Is unsure if this is MSK related to lifting his 20lb dog onto the cough. He describes the dizziness as intermittent but can be when sitting still. Notably he had endarterectomy about 6 weeks ago for limb ischemia or the left lower limb.  .  Review of Systems  Positive: See above Negative:   Physical Exam  BP (!) 122/59   Pulse 76   Temp 98.5 F (36.9 C)   Resp (!) 22   Ht 5\' 10"  (1.778 m)   Wt 54.4 kg   SpO2 93%   BMI 17.21 kg/m  Gen:   Awake, no distress   Resp:  Tachypnea. Distant lung sounds, productive sounding cough  MSK:   Moves extremities without difficulty  Other:  Thin. Non focal exam   Medical Decision Making  Medically screening exam initiated at 8:47 PM.  Appropriate orders placed.  Brett Saunders was informed that the remainder of the evaluation will be completed by another provider, this initial triage assessment does not replace that evaluation, and the importance of remaining in the ED until their evaluation is complete.     Mickie Hillier, PA-C 11/11/21 2049

## 2021-11-12 ENCOUNTER — Encounter (HOSPITAL_COMMUNITY): Payer: Self-pay

## 2021-11-12 ENCOUNTER — Emergency Department (HOSPITAL_COMMUNITY): Payer: No Typology Code available for payment source

## 2021-11-12 ENCOUNTER — Emergency Department (HOSPITAL_COMMUNITY)
Admission: EM | Admit: 2021-11-12 | Discharge: 2021-11-12 | Disposition: A | Discharge status: Home / Self Care | Payer: No Typology Code available for payment source | Attending: Emergency Medicine | Admitting: Emergency Medicine

## 2021-11-12 DIAGNOSIS — R0789 Other chest pain: Secondary | ICD-10-CM | POA: Diagnosis not present

## 2021-11-12 DIAGNOSIS — S22050A Wedge compression fracture of T5-T6 vertebra, initial encounter for closed fracture: Secondary | ICD-10-CM | POA: Insufficient documentation

## 2021-11-12 DIAGNOSIS — J449 Chronic obstructive pulmonary disease, unspecified: Secondary | ICD-10-CM | POA: Diagnosis not present

## 2021-11-12 DIAGNOSIS — S29002A Unspecified injury of muscle and tendon of back wall of thorax, initial encounter: Secondary | ICD-10-CM | POA: Diagnosis present

## 2021-11-12 DIAGNOSIS — E875 Hyperkalemia: Secondary | ICD-10-CM | POA: Insufficient documentation

## 2021-11-12 DIAGNOSIS — X58XXXA Exposure to other specified factors, initial encounter: Secondary | ICD-10-CM | POA: Insufficient documentation

## 2021-11-12 DIAGNOSIS — Z7982 Long term (current) use of aspirin: Secondary | ICD-10-CM | POA: Diagnosis not present

## 2021-11-12 DIAGNOSIS — Z7901 Long term (current) use of anticoagulants: Secondary | ICD-10-CM | POA: Insufficient documentation

## 2021-11-12 DIAGNOSIS — R0602 Shortness of breath: Secondary | ICD-10-CM | POA: Diagnosis not present

## 2021-11-12 LAB — BASIC METABOLIC PANEL
Anion gap: 6 (ref 5–15)
BUN: 6 mg/dL — ABNORMAL LOW (ref 8–23)
CO2: 29 mmol/L (ref 22–32)
Calcium: 8.6 mg/dL — ABNORMAL LOW (ref 8.9–10.3)
Chloride: 94 mmol/L — ABNORMAL LOW (ref 98–111)
Creatinine, Ser: 0.79 mg/dL (ref 0.61–1.24)
GFR, Estimated: >60 mL/min (ref >60)
Glucose, Bld: 125 mg/dL — ABNORMAL HIGH (ref 70–99)
Potassium: 5.3 mmol/L — ABNORMAL HIGH (ref 3.5–5.1)
Sodium: 129 mmol/L — ABNORMAL LOW (ref 135–145)

## 2021-11-12 LAB — CBC
HCT: 38.3 % — ABNORMAL LOW (ref 39.0–52.0)
Hemoglobin: 12.6 g/dL — ABNORMAL LOW (ref 13.0–17.0)
MCH: 31.7 pg (ref 26.0–34.0)
MCHC: 32.9 g/dL (ref 30.0–36.0)
MCV: 96.2 fL (ref 80.0–100.0)
Platelets: 184 10*3/uL (ref 150–400)
RBC: 3.98 MIL/uL — ABNORMAL LOW (ref 4.22–5.81)
RDW: 14.1 % (ref 11.5–15.5)
WBC: 10.1 10*3/uL (ref 4.0–10.5)
nRBC: 0 % (ref 0.0–0.2)

## 2021-11-12 LAB — BRAIN NATRIURETIC PEPTIDE
B Natriuretic Peptide: 135 pg/mL — ABNORMAL HIGH (ref 0.0–100.0)
B Natriuretic Peptide: 152.2 pg/mL — ABNORMAL HIGH (ref 0.0–100.0)

## 2021-11-12 MED ORDER — METHYL SALICYLATE-LIDO-MENTHOL 4-4-5 % EX PTCH: 1.0000 | MEDICATED_PATCH | Freq: Two times a day (BID) | CUTANEOUS | 0 refills | Status: DC

## 2021-11-12 NOTE — ED Provider Notes (Signed)
[facility] DEPT Provider Note   CSN: [id number] Arrival date & time: 11/12/21  1026     History  Chief Complaint  Patient presents with   Shortness of Breath    Brett Saunders is a 76 y.o. male.  HPI Patient presents with pain in the left axilla.  Onset was a few days ago, since that time pain has been inconsistent, worse with motion, specifically leaning towards his right, not necessarily worse with inspiration, expiration or other positioning.  He notes the onset was after he was lifting his dog several times on the couch.  He has multiple medical issues including home oxygen dependency, COPD, baseline level of dyspnea is unchanged.  No fever, nausea, vomiting, diarrhea.  Home Medications Prior to Admission medications   Medication Sig Start Date End Date Taking? Authorizing Provider  Methyl Salicylate-Lido-Menthol 4-4-5 % PTCH Apply 1 patch topically in the morning and at bedtime. 11/12/21  Yes Carmin Muskrat, MD  acetaminophen (TYLENOL) 500 MG tablet Take 500 mg by mouth at bedtime.    [provider]  albuterol (PROVENTIL) (2.5 MG/3ML) 0.083% nebulizer solution Take 2.5 mg by nebulization daily.    [provider]  albuterol (VENTOLIN HFA) 108 (90 Base) MCG/ACT inhaler Inhale 2 puffs into the lungs every 4 (four) hours as needed for wheezing or shortness of breath.    [provider]  amiodarone (PACERONE) 200 MG tablet Take 2 tablets (400 mg total) by mouth 2 (two) times daily. 2 tab twice daily for 2 weeks, 1 tablet twice daily for two weeks then one tablet daily Patient taking differently: Take 100 mg by mouth every morning. 12/23/18   Adrian Prows, MD  apixaban (ELIQUIS) 5 MG TABS tablet Take 2.5 mg by mouth 2 (two) times daily.    [provider]  aspirin EC 81 MG tablet Take 1 tablet (81 mg total) by mouth daily. Swallow whole. 10/02/21 10/02/22  Rhyne, Hulen Shouts, PA-C  furosemide (LASIX) 20 MG tablet Take 20 mg  by mouth daily.     [provider]  metoprolol succinate (TOPROL-XL) 25 MG 24 hr tablet Take 12.5 mg by mouth daily.     [provider]  Mometasone Furoate 200 MCG/ACT AERO Inhale 2 puffs into the lungs at bedtime. Asmanex    [provider]  Olodaterol HCl 2.5 MCG/ACT AERS Inhale 2 puffs into the lungs every morning. 2 puffs    [provider]  omeprazole (PRILOSEC) 20 MG capsule Take 20 mg by mouth daily as needed (Heartburn).    [provider]  OXYGEN Inhale 2 L/min into the lungs at bedtime. Portable during the day    [provider]  pravastatin (PRAVACHOL) 40 MG tablet Take 40 mg by mouth at bedtime.    [provider]  rOPINIRole (REQUIP) 0.25 MG tablet Take 0.25 mg by mouth 2 (two) times daily.    [provider]  sodium chloride 1 g tablet Take 1 g by mouth 3 (three) times daily.    [provider]  traMADol (ULTRAM) 50 MG tablet Take 1 tablet (50 mg total) by mouth every 6 (six) hours as needed. 10/02/21   Rhyne, Hulen Shouts, PA-C      Allergies    Amoxicillin and Avelox [moxifloxacin hcl]    Review of Systems   Review of Systems  All other systems reviewed and are negative.   Physical Exam Updated Vital Signs BP (!) 154/74   Pulse 70   Temp  98.5 F (36.9 C) (Oral)   Resp 18   SpO2 100%  Physical Exam Vitals and nursing note reviewed.  Constitutional:      General: He is not in acute distress.    Appearance: He is well-developed. He is ill-appearing. He is not toxic-appearing or diaphoretic.     Comments: Deconditioned appearing elderly male awake and alert speaking clearly  HENT:     Head: Normocephalic and atraumatic.  Eyes:     Conjunctiva/sclera: Conjunctivae normal.  Cardiovascular:     Rate and Rhythm: Normal rate and regular rhythm.  Pulmonary:     Effort: Pulmonary effort is normal. No respiratory distress.     Breath sounds: No stridor. Decreased breath sounds present. No  wheezing or rhonchi.  Abdominal:     General: There is no distension.  Skin:    General: Skin is warm and dry.  Neurological:     Mental Status: He is alert and oriented to person, place, and time.     ED Results / Procedures / Treatments   Labs (all labs ordered are listed, but only abnormal results are displayed) Labs Reviewed  BRAIN NATRIURETIC PEPTIDE - Abnormal; Notable for the following components:      Result Value   B Natriuretic Peptide 152.2 (*)    All other components within normal limits  CBC - Abnormal; Notable for the following components:   RBC 3.98 (*)    Hemoglobin 12.6 (*)    HCT 38.3 (*)    All other components within normal limits  BASIC METABOLIC PANEL - Abnormal; Notable for the following components:   Sodium 129 (*)    Potassium 5.3 (*)    Chloride 94 (*)    Glucose, Bld 125 (*)    BUN 6 (*)    Calcium 8.6 (*)    All other components within normal limits    EKG EKG Interpretation  Date/Time:  Monday November 12 2021 10:38:20 EDT Ventricular Rate:  70 PR Interval:  200 QRS Duration: 124 QT Interval:  467 QTC Calculation: 504 R Axis:   -45 Text Interpretation: Sinus rhythm Nonspecific IVCD with LAD Left ventricular hypertrophy Anterior infarct, old Artifact Abnormal ECG Confirmed by Carmin Muskrat [phone]) on 11/12/2021 3:31:47 PM  Radiology CT Thoracic Spine Wo Contrast  Result Date: 11/12/2021 CLINICAL DATA:  Compression fracture.  New back pain. EXAM: CT THORACIC SPINE WITHOUT CONTRAST TECHNIQUE: Multidetector CT images of the thoracic were obtained using the standard protocol without intravenous contrast. RADIATION DOSE REDUCTION: This exam was performed according to the departmental dose-optimization program which includes automated exposure control, adjustment of the mA and/or kV according to patient size and/or use of iterative reconstruction technique. COMPARISON:  04/20/2019 FINDINGS: Alignment: Slightly increased kyphotic curvature. Mild  scoliotic curvature convex to the right. Vertebrae: Newly seen inferior endplate fracture at T6 with loss of height of 10-20%. No retropulsed bone. Previously seen compression fracture at T11 with mild posterior bowing of the posterior margin of the vertebral body but no significant encroachment upon the spinal canal. This fracture does not appear to have progressed in any significant fashion since February and is probably largely healed, though it may not be completely healed particularly on the left side. Paraspinal and other soft tissues: Advanced chronic lung disease with emphysema and pulmonary scarring. Worsening opacity of the medial left lung base which could be due to scarring, pneumonia or tumor. Because of the change, I would suggest repeat evaluation of the entire chest with contrast for comparison with  priors. Disc levels: No significant degenerative change of the spine. No stenosis of the canal or foramina. IMPRESSION: New inferior endplate fracture at T6 with loss of height 10-20%. No retropulsed bone. No finding to suggest that this is anything other than an osteoporotic fracture. Total or subtotal healing of the previously seen compression fracture of T11. There may still be an element of active healing on going on the left lateral margin of this fracture. Increased pulmonary parenchymal density at the medial left lung base. Question if this represents scarring, pneumonia or tumor. Because of the change, I would suggest repeat evaluation of the entire chest with contrast for comparison with prior examinations. This could be done electively. Electronically Signed   By: Nelson Chimes M.D.   On: 11/12/2021 14:02   DG Chest 2 View  Result Date: 11/12/2021 CLINICAL DATA:  Shortness of breath and chest pain over the last several days EXAM: CHEST - 2 VIEW COMPARISON:  11/11/2021 FINDINGS: Pacemaker/AICD remains in place. Worsening of pulmonary venous hypertension with interstitial edema now present.  Chronic pleural scarring. Chronic pulmonary markings. Chronic volume loss at the medial left base. Lower thoracic compression fractures as seen yesterday. IMPRESSION: Worsening venous hypertension with interstitial edema. Persistent volume loss of the medial left lung base. Electronically Signed   By: Nelson Chimes M.D.   On: 11/12/2021 12:03   DG Chest 2 View  Result Date: 11/11/2021 CLINICAL DATA:  Shortness of breath EXAM: CHEST - 2 VIEW COMPARISON:  12/22/2018 chest radiograph and prior studies. 04/20/2019 thoracic spine CT. FINDINGS: The cardiomediastinal silhouette is unchanged. A LEFT pacemaker/AICD again noted. Hyperinflation again identified. Two nodular opacities overlying the RIGHT UPPER lung are noted. MEDIAL LEFT basilar opacity may represent focal consolidation or atelectasis. There is no evidence of pneumothorax or large pleural effusion. A 80% LOWER thoracic compression fracture is again noted. IMPRESSION: 1. MEDIAL LEFT basilar opacity may represent focal consolidation or atelectasis. 2. Two nodular opacities overlying the RIGHT UPPER lung. Chest CT is recommended further evaluation Electronically Signed   By: Margarette Canada M.D.   On: 11/11/2021 21:45    Procedures Procedures    Medications Ordered in ED Medications - No data to display  ED Course/ Medical Decision Making/ A&P This patient with a Hx of cigarette addiction, COPD, home oxygen dependency, recent vascular procedure in the lower extremities, ongoing Eliquis use and history of prior T11 compression fracture presents to the ED for concern of left axillary pain, chest pain, this involves an extensive number of treatment options, and is a complaint that carries with it a high risk of complications and morbidity.    The differential diagnosis includes ACS, PE, pneumonia, risk spine disease   Social Determinants of Health:  Age, home oxygen dependency  Additional history obtained:  Additional history and/or information  obtained from chart review, notable for orthopedic follow-up following prior T11 compression fracture, and recent surgery, left iliofemoral endarterectomy   After the initial evaluation, orders, including: Labs x-ray ECG monitoring were initiated.   Patient placed on Cardiac and Pulse-Oximetry Monitors. The patient was maintained on a cardiac monitor.  The cardiac monitored showed an rhythm of 70 to sinus normal The patient was also maintained on pulse oximetry. The readings were typically 99% 2 L nasal cannula normal   On repeat evaluation of the patient stayed the same  Lab Tests:  I personally interpreted labs.  The pertinent results include: Mild hyper kalemia  Imaging Studies ordered:  I independently visualized and interpreted imaging  which showed T6 compression fracture I agree with the radiologist interpretation   Dispostion / Final MDM:  After consideration of the diagnostic results and the patient's response to treatment, patient is awake, alert, hemodynamically unremarkable, has no new oxygen requirement, this is contrary somewhat to prior nursing notes.  He has no new respiratory complaints, there is no evidence for ACS with reassuring ECG, normal troponin.  Patient does have mild hyperkalemia, but no notable ECG changes.  PE unlikely, patient is on Eliquis.  Symptoms of left ex rib pain, with onset after lifting a dog, twisting or suggestive of compression fracture, which is consistent with patient's history of osteoporosis.  No evidence for other acute new findings, patient comfortable with discharge with initiation of topical anti-inflammatory medication.  Hospitalization is a consideration given the patient's multiple medical problems, but given the identified pathology, patient discharged in stable condition.  Final Clinical Impression(s) / ED Diagnoses Final diagnoses:  Atypical chest pain  Hyperkalemia  Compression fracture of T6 vertebra, initial encounter [facility])     Rx / DC Orders ED Discharge Orders          Ordered    Methyl Salicylate-Lido-Menthol 4-4-5 % PTCH  2 times daily        11/12/21 1536              Carmin Muskrat, MD 11/12/21 1537

## 2021-11-12 NOTE — ED Provider Triage Note (Signed)
Emergency Medicine Provider Triage Evaluation Note  Brett Saunders , a 76 y.o. male  was evaluated in triage.  Pt complains of shortness of breath.  Patient complaining of shortness of breath, weakness, dizziness for the last 2 weeks.  Patient reports she has COPD, wears 2 L of oxygen at baseline.  The patient denies any recent increase in his.  The patient states that he has been smoking more frequently this past 1 week.  Patient also complaining of left shoulder pain, states that he believes it is musculoskeletal as he has been lifting his 20 pound dog onto the couch.  Patient denies any fevers, worsening cough and baseline.  Review of Systems  Positive:  Negative:   Physical Exam  BP (!) 149/73 (BP Location: Left Arm)   Pulse 71   Temp 98.5 F (36.9 C) (Oral)   Resp 18   SpO2 100%  Gen:   Awake, no distress   Resp:  Normal effort  MSK:   Moves extremities without difficulty  Other:    Medical Decision Making  Medically screening exam initiated at 11:39 AM.  Appropriate orders placed.  Brett Saunders was informed that the remainder of the evaluation will be completed by another provider, this initial triage assessment does not replace that evaluation, and the importance of remaining in the ED until their evaluation is complete.    Azucena Cecil, PA-C 11/12/21 1141

## 2021-11-12 NOTE — ED Notes (Signed)
Patient transported to X-ray 

## 2021-11-12 NOTE — ED Triage Notes (Signed)
Pt arrived via SOB COPD x3-4 days, 88 % 3L [place]. 2 L [place] baseline.   20L FA

## 2021-11-12 NOTE — ED Notes (Signed)
Patient called x3 with no response and not visible in the lobby anymore

## 2021-11-12 NOTE — Discharge Instructions (Addendum)
As discussed, your symptoms are likely due to compression fracture of your thoracic vertebrae, #6.  However, if any other medical issues that importantly follow-up with both your primary care physician and your orthopedist.  Return here for concerning changes in your condition.

## 2022-01-15 ENCOUNTER — Other Ambulatory Visit: Payer: Self-pay | Admitting: Orthopedic Surgery

## 2022-01-16 ENCOUNTER — Other Ambulatory Visit: Payer: Self-pay | Admitting: Orthopedic Surgery

## 2022-01-16 DIAGNOSIS — M545 Low back pain, unspecified: Secondary | ICD-10-CM

## 2022-02-22 ENCOUNTER — Other Ambulatory Visit (HOSPITAL_COMMUNITY): Payer: Self-pay | Admitting: Orthopedic Surgery

## 2022-02-22 DIAGNOSIS — M545 Low back pain, unspecified: Secondary | ICD-10-CM

## 2022-02-27 ENCOUNTER — Ambulatory Visit
Admission: RE | Admit: 2022-02-27 | Discharge: 2022-02-27 | Disposition: A | Discharge status: Home / Self Care | Payer: Medicare HMO | Source: Ambulatory Visit | Attending: Orthopedic Surgery | Admitting: Orthopedic Surgery

## 2022-02-27 DIAGNOSIS — M545 Low back pain, unspecified: Secondary | ICD-10-CM

## 2022-02-28 ENCOUNTER — Other Ambulatory Visit: Payer: No Typology Code available for payment source

## 2022-03-07 ENCOUNTER — Encounter (HOSPITAL_COMMUNITY)
Admission: RE | Admit: 2022-03-07 | Discharge: 2022-03-07 | Disposition: A | Discharge status: Home / Self Care | Payer: Medicare HMO | Source: Ambulatory Visit | Attending: Orthopedic Surgery | Admitting: Orthopedic Surgery

## 2022-03-07 ENCOUNTER — Other Ambulatory Visit (HOSPITAL_COMMUNITY): Payer: Self-pay | Admitting: Orthopedic Surgery

## 2022-03-07 DIAGNOSIS — M545 Low back pain, unspecified: Secondary | ICD-10-CM

## 2022-03-07 MED ORDER — TECHNETIUM TC 99M MEDRONATE IV KIT
20.0000 | PACK | Freq: Once | INTRAVENOUS | Status: AC | PRN
  Administered 2022-03-07: 20 via INTRAVENOUS

## 2022-03-28 ENCOUNTER — Encounter: Payer: Self-pay | Admitting: Cardiovascular Disease

## 2022-03-28 ENCOUNTER — Ambulatory Visit
Payer: No Typology Code available for payment source | Attending: Cardiovascular Disease | Admitting: Cardiovascular Disease

## 2022-03-28 VITALS — BP 134/86 | HR 78 | Ht 70.0 in | Wt 115.0 lb

## 2022-03-28 DIAGNOSIS — Z4502 Encounter for adjustment and management of automatic implantable cardiac defibrillator: Secondary | ICD-10-CM

## 2022-03-28 NOTE — Patient Instructions (Signed)
Medication Instructions:  Your physician recommends that you continue on your current medications as directed. Please refer to the Current Medication list given to you today.  *If you need a refill on your cardiac medications before your next appointment, please call your pharmacy*   Lab Work: None ordered If you have labs (blood work) drawn today and your tests are completely normal, you will receive your results only by: [place] (if you have MyChart) OR A paper copy in the mail If you have any lab test that is abnormal or we need to change your treatment, we will call you to review the results.   Testing/Procedures: None ordered   Follow-Up: At [facility], you and your health needs are our priority.  As part of our continuing mission to provide you with exceptional heart care, we have created designated Provider Care Teams.  These Care Teams include your primary Cardiologist (physician) and Advanced Practice Providers (APPs -  Physician Assistants and Nurse Practitioners) who all work together to provide you with the care you need, when you need it.  We recommend signing up for the patient portal called "MyChart".  Sign up information is provided on this After Visit Summary.  MyChart is used to connect with patients for Virtual Visits (Telemedicine).  Patients are able to view lab/test results, encounter notes, upcoming appointments, etc.  Non-urgent messages can be sent to your provider as well.   To learn more about what you can do with MyChart, go to [email or web address].    Your next appointment:   1 month(s)  The format for your next appointment:   In Person  Provider:   Device clinic for a battery check     Thank you for choosing [facility]!!   [phone]  Other Instructions    Important Information About Sugar

## 2022-03-28 NOTE — Progress Notes (Signed)
Electrophysiology Office Note:    Date:  03/28/2022   ID:  Brett Saunders, DOB September 10, 1945, MRN [id number]  PCP:  Theodis Shove, DO   [facility] Providers Cardiologist:  None Electrophysiologist:  Maurice Small, MD     Referring MD: Theodis Shove, *   History of Present Illness:    Brett Saunders is a 77 y.o. male with a hx listed below, significant for AF, referred for arrhythmia management.  Medtronic dual chamber ICD placed 2014. He reports that he has had several shocks for AF with RVR but never anything for VT. He has been following with the [place].  he has no device related complaints -- no new tenderness, drainage, redness.   Past Medical History:  Diagnosis Date   Aortic anomaly    aortic calcification   Arrhythmia    a fib   Atrial fibrillation (HCC)    CAD (coronary artery disease)    CHF (congestive heart failure) (HCC)    Chronic obstructive lung disease (HCC)    COPD (chronic obstructive pulmonary disease) (HCC)    Diverticulosis 2010   colonoscopy 08-2008   DVT (deep venous thrombosis) (HCC)    GERD (gastroesophageal reflux disease)    Hemorrhoids    History of atrial fibrillation    Hypertension    Ischemic cardiomyopathy    Lung cancer (HCC)    lung   On home oxygen therapy    Peripheral vascular disease (HCC)    Stroke (HCC)    oct 2012    Past Surgical History:  Procedure Laterality Date   ABDOMINAL AORTOGRAM W/LOWER EXTREMITY N/A 09/13/2021   Procedure: ABDOMINAL AORTOGRAM W/LOWER EXTREMITY;  Surgeon: Cephus Shelling, MD;  Location: [facility];  Service: Cardiovascular;  Laterality: N/A;   CORONARY ANGIOGRAM  2013   After stroke-[facility], [place].   ENDARTERECTOMY Left 10/01/2021   Procedure: LEFT ILIOFEMORAL ENDARTERECTOMY WITH  1 cm X 6 cm BOVINE PATCH;  Surgeon: Cephus Shelling, MD;  Location: [facility] OR;  Service: Vascular;  Laterality: Left;   PACEMAKER PLACEMENT     and  defibrillator    Current Medications: Current Meds  Medication Sig   acetaminophen (TYLENOL) 500 MG tablet Take 500 mg by mouth at bedtime.   albuterol (PROVENTIL) (2.5 MG/3ML) 0.083% nebulizer solution Take 2.5 mg by nebulization daily.   albuterol (VENTOLIN HFA) 108 (90 Base) MCG/ACT inhaler Inhale 2 puffs into the lungs every 4 (four) hours as needed for wheezing or shortness of breath.   amiodarone (PACERONE) 200 MG tablet Take 2 tablets (400 mg total) by mouth 2 (two) times daily. 2 tab twice daily for 2 weeks, 1 tablet twice daily for two weeks then one tablet daily (Patient taking differently: Take 100 mg by mouth every morning.)   apixaban (ELIQUIS) 5 MG TABS tablet Take 2.5 mg by mouth 2 (two) times daily.   aspirin EC 81 MG tablet Take 1 tablet (81 mg total) by mouth daily. Swallow whole.   furosemide (LASIX) 20 MG tablet Take 20 mg by mouth daily.    Methyl Salicylate-Lido-Menthol 4-4-5 % PTCH Apply 1 patch topically in the morning and at bedtime.   metoprolol succinate (TOPROL-XL) 25 MG 24 hr tablet Take 12.5 mg by mouth daily.    Mometasone Furoate 200 MCG/ACT AERO Inhale 2 puffs into the lungs at bedtime. Asmanex   Olodaterol HCl 2.5 MCG/ACT AERS Inhale 2 puffs into the lungs every morning. 2 puffs   omeprazole (PRILOSEC) 20 MG  capsule Take 20 mg by mouth daily as needed (Heartburn).   OXYGEN Inhale 2 L/min into the lungs at bedtime. Portable during the day   pentoxifylline (TRENTAL) 400 MG CR tablet    pravastatin (PRAVACHOL) 40 MG tablet Take 40 mg by mouth at bedtime.   rOPINIRole (REQUIP) 0.25 MG tablet Take 0.25 mg by mouth 2 (two) times daily.   sodium chloride 1 g tablet Take 1 g by mouth 3 (three) times daily.   Tiotropium Bromide-Olodaterol 2.5-2.5 MCG/ACT AERS INHALE 2 PUFFS BY MOUTH IN THE MORNING   traMADol (ULTRAM) 50 MG tablet Take 1 tablet (50 mg total) by mouth every 6 (six) hours as needed.     Allergies:   Amoxicillin and Avelox [moxifloxacin hcl]   Social  History   Socioeconomic History   Marital status: Married    Spouse name: Not on file   Number of children: Not on file   Years of education: Not on file   Highest education level: Not on file  Occupational History   Occupation: retired  Tobacco Use   Smoking status: Some Days    Packs/day: 0.10    Types: Cigarettes   Smokeless tobacco: Never   Tobacco comments:    pt smokes 1 cigarrete occasionally. 1 pack "last a few months"  Vaping Use   Vaping Use: Every day   Start date: 04/14/2011   Substances: Nicotine  Substance and Sexual Activity   Alcohol use: Yes    Comment: occasional beer   Drug use: Never   Sexual activity: Not on file  Other Topics Concern   Not on file  Social History Narrative   Not on file   Social Determinants of Health   Financial Resource Strain: Not on file  Food Insecurity: Not on file  Transportation Needs: Not on file  Physical Activity: Not on file  Stress: Not on file  Social Connections: Not on file     Family History: The patient's family history is not on file.  ROS:   Please see the history of present illness.    All other systems reviewed and are negative.  EKGs/Labs/Other Studies Reviewed Today:     TTE: EF 50-55%  EKG:  Last EKG results: today - A-paced with occasional PACs   Recent Labs: 09/25/2021: ALT 15 11/12/2021: B Natriuretic Peptide 152.2; BUN 6; Creatinine, Ser 0.79; Hemoglobin 12.6; Platelets 184; Potassium 5.3; Sodium 129     Physical Exam:    VS:  BP 134/86   Pulse 78   Ht 5\' 10"  (1.778 m)   Wt 115 lb (52.2 kg)   SpO2 94%   BMI 16.50 kg/m     Wt Readings from Last 3 Encounters:  03/28/22 115 lb (52.2 kg)  11/11/21 119 lb 14.9 oz (54.4 kg)  10/23/21 120 lb (54.4 kg)     GEN: thin, frail appearing, in no acute distress. Nasal oxygen in place. Smell of cigarette smore CARDIAC: RRR, no murmurs, rubs, gallops RESPIRATORY:  Normal work of breathing MUSCULOSKELETAL: no edema    ASSESSMENT & PLAN:     Atrial fibrillation: burden  < 10% but rates poorly controlled. On eliquis and amiodarone. Today, he states that he is here for the generator change and will continue to receive medical care for his issues through the 12/23/21 Medtronic dual chamber ICD in place:  normal.  Approaching RRT.  Will bring him back to clinic in a month just in case the [place] is not prompt and transferring remote monitoring  to [place]. Anticipate Gen change within the next month or two.          Medication Adjustments/Labs and Tests Ordered: Current medicines are reviewed at length with the patient today.  Concerns regarding medicines are outlined above.  No orders of the defined types were placed in this encounter.  No orders of the defined types were placed in this encounter.    Signed, Maurice Small, MD  03/28/2022 3:53 PM    [facility]

## 2022-04-09 ENCOUNTER — Telehealth: Payer: Self-pay

## 2022-04-09 DIAGNOSIS — Z4502 Encounter for adjustment and management of automatic implantable cardiac defibrillator: Secondary | ICD-10-CM

## 2022-04-09 NOTE — Telephone Encounter (Signed)
Called pt to offer a date for his Gen Change. Per Dr. Nelly Laurence he doesn't need appt with AT on 2/7.Marland KitchenMarland KitchenMarland Kitchen

## 2022-04-15 NOTE — Telephone Encounter (Signed)
When putting in hospital orders it was showing an allergy with Cefazolin and Vancomycin. After speaking with the Pharmacist [facility]) I was told it was ok to use the Vancomycin.

## 2022-04-15 NOTE — Addendum Note (Signed)
Addended by: Carylon Perches on: 04/15/2022 04:11 PM   Modules accepted: Orders

## 2022-04-15 NOTE — Telephone Encounter (Signed)
Pt is scheduled for ICD Gen Change on 2/28.  He will come in for labs on 2/14 and get scrub soap that day.  He is aware of how to find Instruction letter in his MyChart and I will mail him a copy per his request.

## 2022-04-24 ENCOUNTER — Encounter: Payer: No Typology Code available for payment source | Admitting: Student

## 2022-04-29 ENCOUNTER — Telehealth: Payer: Self-pay | Admitting: Cardiovascular Disease

## 2022-04-29 NOTE — Telephone Encounter (Signed)
Left message for patient informing him of the following restrictions after his ICD generator change:  Do not lift anything that is heavier than 10 lbs until you are cleared to do so.  Avoiding lifting your affected arm higher than your shoulder for the first week.  You may be given a sedative medication for your procedure, you will not be able to drive for 24 hours after your procedure.   Patient may go to his [facility] appt if he has someone to drive him.  Provided office number for callback.

## 2022-04-29 NOTE — Telephone Encounter (Signed)
  Pt said, his procedure on 05/15/22 and the next day 05/16/22 he had an appt to the [place] clinic. He wants to know if he is ok to go out a day after his procedure

## 2022-05-01 ENCOUNTER — Ambulatory Visit: Payer: No Typology Code available for payment source | Attending: Cardiovascular Disease

## 2022-05-01 DIAGNOSIS — Z4502 Encounter for adjustment and management of automatic implantable cardiac defibrillator: Secondary | ICD-10-CM

## 2022-05-02 LAB — CBC
Hematocrit: 34.7 % — ABNORMAL LOW (ref 37.5–51.0)
Hemoglobin: 11.3 g/dL — ABNORMAL LOW (ref 13.0–17.7)
MCH: 30.9 pg (ref 26.6–33.0)
MCHC: 32.6 g/dL (ref 31.5–35.7)
MCV: 95 fL (ref 79–97)
Platelets: 235 10*3/uL (ref 150–450)
RBC: 3.66 x10E6/uL — ABNORMAL LOW (ref 4.14–5.80)
RDW: 12 % (ref 11.6–15.4)
WBC: 9.9 10*3/uL (ref 3.4–10.8)

## 2022-05-02 LAB — BASIC METABOLIC PANEL
BUN/Creatinine Ratio: 9 — ABNORMAL LOW (ref 10–24)
BUN: 9 mg/dL (ref 8–27)
CO2: 28 mmol/L (ref 20–29)
Calcium: 8.9 mg/dL (ref 8.6–10.2)
Chloride: 95 mmol/L — ABNORMAL LOW (ref 96–106)
Creatinine, Ser: 0.98 mg/dL (ref 0.76–1.27)
Glucose: 130 mg/dL — ABNORMAL HIGH (ref 70–99)
Potassium: 3.8 mmol/L (ref 3.5–5.2)
Sodium: 142 mmol/L (ref 134–144)
eGFR: 79 mL/min/{1.73_m2} (ref >59)

## 2022-05-14 NOTE — Pre-Procedure Instructions (Signed)
Instructed patient on the following items: Arrival time 1230 Nothing to eat or drink after midnight No meds AM of procedure Responsible person to drive you home and stay with you for 24 hrs Wash with special soap night before and morning of procedure If on anti-coagulant drug instructions Eliquis- last doses 2/25

## 2022-05-15 ENCOUNTER — Encounter (HOSPITAL_COMMUNITY)
Admission: RE | Disposition: A | Discharge status: Home / Self Care | Payer: Self-pay | Source: Home / Self Care | Attending: Cardiovascular Disease

## 2022-05-15 ENCOUNTER — Other Ambulatory Visit: Payer: Self-pay

## 2022-05-15 ENCOUNTER — Ambulatory Visit (HOSPITAL_COMMUNITY)
Admission: RE | Admit: 2022-05-15 | Discharge: 2022-05-15 | Disposition: A | Discharge status: Home / Self Care | Payer: No Typology Code available for payment source | Attending: Cardiovascular Disease | Admitting: Cardiovascular Disease

## 2022-05-15 DIAGNOSIS — Z4502 Encounter for adjustment and management of automatic implantable cardiac defibrillator: Secondary | ICD-10-CM | POA: Diagnosis present

## 2022-05-15 DIAGNOSIS — I255 Ischemic cardiomyopathy: Secondary | ICD-10-CM | POA: Diagnosis not present

## 2022-05-15 DIAGNOSIS — I4891 Unspecified atrial fibrillation: Secondary | ICD-10-CM | POA: Diagnosis not present

## 2022-05-15 DIAGNOSIS — I110 Hypertensive heart disease with heart failure: Secondary | ICD-10-CM | POA: Diagnosis not present

## 2022-05-15 DIAGNOSIS — I509 Heart failure, unspecified: Secondary | ICD-10-CM | POA: Diagnosis not present

## 2022-05-15 DIAGNOSIS — Z79899 Other long term (current) drug therapy: Secondary | ICD-10-CM | POA: Insufficient documentation

## 2022-05-15 DIAGNOSIS — F17210 Nicotine dependence, cigarettes, uncomplicated: Secondary | ICD-10-CM | POA: Insufficient documentation

## 2022-05-15 DIAGNOSIS — R001 Bradycardia, unspecified: Secondary | ICD-10-CM | POA: Diagnosis not present

## 2022-05-15 DIAGNOSIS — Z7901 Long term (current) use of anticoagulants: Secondary | ICD-10-CM | POA: Diagnosis not present

## 2022-05-15 HISTORY — PX: ICD GENERATOR CHANGEOUT: EP1231

## 2022-05-15 SURGERY — ICD GENERATOR CHANGEOUT

## 2022-05-15 MED ORDER — ONDANSETRON HCL 4 MG/2ML IJ SOLN: 4.0000 mg | Freq: Four times a day (QID) | INTRAMUSCULAR | Status: DC | PRN

## 2022-05-15 MED ORDER — DIPHENHYDRAMINE HCL 50 MG/ML IJ SOLN
INTRAMUSCULAR | Status: AC
  Filled 2022-05-15: qty 1

## 2022-05-15 MED ORDER — LIDOCAINE HCL (PF) 1 % IJ SOLN
INTRAMUSCULAR | Status: DC | PRN
  Administered 2022-05-15: 30 mL

## 2022-05-15 MED ORDER — METHYLPREDNISOLONE SODIUM SUCC 125 MG IJ SOLR
INTRAMUSCULAR | Status: DC | PRN
  Administered 2022-05-15: 125 mg via INTRAVENOUS

## 2022-05-15 MED ORDER — VANCOMYCIN HCL IN DEXTROSE 1-5 GM/200ML-% IV SOLN
1000.0000 mg | INTRAVENOUS | Status: AC
  Administered 2022-05-15: 1000 mg via INTRAVENOUS

## 2022-05-15 MED ORDER — ACETAMINOPHEN 325 MG PO TABS: 325.0000 mg | ORAL_TABLET | ORAL | Status: DC | PRN

## 2022-05-15 MED ORDER — CHLORHEXIDINE GLUCONATE 4 % EX LIQD
4.0000 | Freq: Once | CUTANEOUS | Status: DC
  Filled 2022-05-15: qty 60

## 2022-05-15 MED ORDER — SODIUM CHLORIDE 0.9 % IV SOLN
80.0000 mg | INTRAVENOUS | Status: AC
  Administered 2022-05-15: 80 mg

## 2022-05-15 MED ORDER — LIDOCAINE HCL 1 % IJ SOLN
INTRAMUSCULAR | Status: AC
  Filled 2022-05-15: qty 40

## 2022-05-15 MED ORDER — METHYLPREDNISOLONE SODIUM SUCC 125 MG IJ SOLR
INTRAMUSCULAR | Status: AC
  Filled 2022-05-15: qty 2

## 2022-05-15 MED ORDER — SODIUM CHLORIDE 0.9 % IV SOLN: INTRAVENOUS | Status: DC

## 2022-05-15 MED ORDER — MIDAZOLAM HCL 5 MG/5ML IJ SOLN
INTRAMUSCULAR | Status: AC
  Filled 2022-05-15: qty 5

## 2022-05-15 MED ORDER — VANCOMYCIN HCL IN DEXTROSE 1-5 GM/200ML-% IV SOLN
INTRAVENOUS | Status: AC
  Filled 2022-05-15: qty 200

## 2022-05-15 MED ORDER — POVIDONE-IODINE 10 % EX SWAB
2.0000 | Freq: Once | CUTANEOUS | Status: AC
  Administered 2022-05-15: 2 via TOPICAL

## 2022-05-15 MED ORDER — SODIUM CHLORIDE 0.9 % IV SOLN
INTRAVENOUS | Status: AC
  Filled 2022-05-15: qty 2

## 2022-05-15 MED ORDER — FENTANYL CITRATE (PF) 100 MCG/2ML IJ SOLN
INTRAMUSCULAR | Status: AC
  Filled 2022-05-15: qty 2

## 2022-05-15 MED ORDER — MIDAZOLAM HCL 5 MG/5ML IJ SOLN
INTRAMUSCULAR | Status: DC | PRN
  Administered 2022-05-15: 1 mg via INTRAVENOUS

## 2022-05-15 MED ORDER — FENTANYL CITRATE (PF) 100 MCG/2ML IJ SOLN
INTRAMUSCULAR | Status: DC | PRN
  Administered 2022-05-15: 25 ug via INTRAVENOUS

## 2022-05-15 MED ORDER — DIPHENHYDRAMINE HCL 50 MG/ML IJ SOLN
INTRAMUSCULAR | Status: DC | PRN
  Administered 2022-05-15: 25 mg via INTRAVENOUS

## 2022-05-15 SURGICAL SUPPLY — 6 items
CABLE SURGICAL S-101-97-12 (CABLE) ×1 IMPLANT
ICD COBALT XT DR DDPA2D4 (ICD Generator) IMPLANT
PAD DEFIB RADIO PHYSIO CONN (PAD) ×1 IMPLANT
POUCH AIGIS-R ANTIBACT ICD (Mesh General) ×1 IMPLANT
POUCH AIGIS-R ANTIBACT ICD LRG (Mesh General) IMPLANT
TRAY PACEMAKER INSERTION (PACKS) ×1 IMPLANT

## 2022-05-15 NOTE — Discharge Instructions (Signed)

## 2022-05-15 NOTE — H&P (Signed)
Electrophysiology Office Note:    Date:  05/15/2022   ID:  Brett Saunders., DOB Jul 26, 1945, MRN [id number]  PCP:  Berneta Levins, DO   [facility] Providers Cardiologist:  None Electrophysiologist:  Melida Quitter, MD     Referring MD: No ref. provider found   History of Present Illness:    Brett Saunders. is a 77 y.o. male with a hx listed below, significant for AF, referred for arrhythmia management.  Medtronic dual chamber ICD placed 2014. He reports that he has had several shocks for AF with RVR but never anything for VT. He has been following with the [place].  he has no device related complaints -- no new tenderness, drainage, redness.  He denies having any changes in condition, diagnoses, medications since his last visit. He has not taken eliquis since Sunday.   Past Medical History:  Diagnosis Date   Aortic anomaly    aortic calcification   Arrhythmia    a fib   Atrial fibrillation (HCC)    CAD (coronary artery disease)    CHF (congestive heart failure) (HCC)    Chronic obstructive lung disease (HCC)    COPD (chronic obstructive pulmonary disease) ([place])    Diverticulosis 2010   colonoscopy 08-2008   DVT (deep venous thrombosis) (HCC)    GERD (gastroesophageal reflux disease)    Hemorrhoids    History of atrial fibrillation    Hypertension    Ischemic cardiomyopathy    Lung cancer ([place])    lung   On home oxygen therapy    Peripheral vascular disease ([place])    Stroke ([place])    oct 2012    Past Surgical History:  Procedure Laterality Date   ABDOMINAL AORTOGRAM W/LOWER EXTREMITY N/A 09/13/2021   Procedure: ABDOMINAL AORTOGRAM W/LOWER EXTREMITY;  Surgeon: [name], MD;  Location: [place] CV LAB;  Service: Cardiovascular;  Laterality: N/A;   CORONARY ANGIOGRAM  2013   After stroke-[facility], [place].   ENDARTERECTOMY Left 10/01/2021   Procedure: LEFT ILIOFEMORAL ENDARTERECTOMY WITH  1 cm X 6 cm BOVINE  PATCH;  Surgeon: [name], MD;  Location: [facility];  Service: Vascular;  Laterality: Left;   PACEMAKER PLACEMENT     and defibrillator    Current Medications: Current Meds  Medication Sig   acetaminophen (TYLENOL) 500 MG tablet Take 500 mg by mouth at bedtime.   albuterol (PROVENTIL) (2.5 MG/3ML) 0.083% nebulizer solution Take 2.5 mg by nebulization daily.   albuterol (VENTOLIN HFA) 108 (90 Base) MCG/ACT inhaler Inhale 2 puffs into the lungs every 4 (four) hours as needed for wheezing or shortness of breath.   amiodarone (PACERONE) 200 MG tablet Take 2 tablets (400 mg total) by mouth 2 (two) times daily. 2 tab twice daily for 2 weeks, 1 tablet twice daily for two weeks then one tablet daily (Patient taking differently: Take 100 mg by mouth every morning.)   apixaban (ELIQUIS) 5 MG TABS tablet Take 2.5 mg by mouth 2 (two) times daily.   aspirin EC 81 MG tablet Take 1 tablet (81 mg total) by mouth daily. Swallow whole.   furosemide (LASIX) 20 MG tablet Take 20 mg by mouth daily.    metoprolol succinate (TOPROL-XL) 25 MG 24 hr tablet Take 12.5 mg by mouth daily.    mometasone (ASMANEX, 60 METERED DOSES,) 220 MCG/ACT inhaler Inhale 2 puffs into the lungs at bedtime.   Omega-3 Fatty Acids (FISH OIL PO) Take 1 capsule by mouth daily.  omeprazole (PRILOSEC) 20 MG capsule Take 20 mg by mouth daily as needed (Heartburn).   rOPINIRole (REQUIP) 0.25 MG tablet Take 0.25 mg by mouth 2 (two) times daily.   rosuvastatin (CRESTOR) 5 MG tablet Take 5 mg by mouth daily.   sodium chloride 1 g tablet Take 1 g by mouth 3 (three) times daily.   Tiotropium Bromide-Olodaterol 2.5-2.5 MCG/ACT AERS Inhale 2 each into the lungs daily.   triamcinolone ointment (KENALOG) 0.1 % Apply 1 Application topically 2 (two) times daily.     Allergies:   Amoxicillin and Avelox [moxifloxacin hcl]   Social History   Socioeconomic History   Marital status: Married    Spouse name: Not on file   Number of children:  Not on file   Years of education: Not on file   Highest education level: Not on file  Occupational History   Occupation: retired  Tobacco Use   Smoking status: Some Days    Packs/day: 0.10    Types: Cigarettes   Smokeless tobacco: Never   Tobacco comments:    pt smokes 1 cigarrete occasionally. 1 pack "last a few months"  Vaping Use   Vaping Use: Every day   Start date: 04/14/2011   Substances: Nicotine  Substance and Sexual Activity   Alcohol use: Yes    Comment: occasional beer   Drug use: Never   Sexual activity: Not on file  Other Topics Concern   Not on file  Social History Narrative   Not on file   Social Determinants of Health   Financial Resource Strain: Not on file  Food Insecurity: Not on file  Transportation Needs: Not on file  Physical Activity: Not on file  Stress: Not on file  Social Connections: Not on file     Family History: The patient's family history is not on file.  ROS:   Please see the history of present illness.    All other systems reviewed and are negative.  EKGs/Labs/Other Studies Reviewed Today:     TTE: EF 50-55%  EKG:  Last EKG results: today - A-paced with occasional PACs   Recent Labs: 09/25/2021: ALT 15 11/12/2021: B Natriuretic Peptide 152.2 05/01/2022: BUN 9; Creatinine, Ser 0.98; Hemoglobin 11.3; Platelets 235; Potassium 3.8; Sodium 142     Physical Exam:    VS:  BP 132/67   Pulse 81   Temp 98.2 F (36.8 C) (Oral)   Resp 17   Ht '5\' 8"'$  (1.727 m)   Wt 50.8 kg   SpO2 97%   BMI 17.03 kg/m     Wt Readings from Last 3 Encounters:  05/15/22 50.8 kg  03/28/22 52.2 kg  11/11/21 54.4 kg     GEN: thin, frail appearing, in no acute distress. Nasal oxygen in place. Smell of cigarette smore CARDIAC: RRR, no murmurs, rubs, gallops RESPIRATORY:  Normal work of breathing MUSCULOSKELETAL: no edema    ASSESSMENT & PLAN:    Atrial fibrillation: burden  < 10% but rates poorly controlled. On eliquis and amiodarone. Today,  he states that he is here for the generator change and will continue to receive medical care for his issues through the [place] dual chamber ICD in place:  at RRT. Here for generator change. I think submuscular placement would be beneficial if he can tolerate it.         Medication Adjustments/Labs and Tests Ordered: Current medicines are reviewed at length with the patient today.  Concerns regarding medicines are outlined above.  Orders Placed  This Encounter  Procedures   Informed Consent Details: Physician/Practitioner Attestation; Transcribe to consent form and obtain patient signature   Initiate Pre-op Protocol   Apply Cardiac Implantable Device Care Plan   Void on call to EP Lab   Electrode Placement Place arm electrodes on posterior shoulders   Confirm CBC and BMP (or CMP) results within 7 days for inpatient and 30 days for outpatient:   Pre-admission testing diagnosis   Use clippers to remove hair, entire chest area   SCRUB WITH Chlorhexidine (HIBICLENS) 4%   EP PPM/ICD IMPLANT   Insert peripheral IV   Meds ordered this encounter  Medications   0.9 %  sodium chloride infusion   gentamicin (GARAMYCIN) 80 mg in sodium chloride 0.9 % 500 mL irrigation   chlorhexidine (HIBICLENS) 4 % liquid 4 Application   vancomycin (VANCOCIN) IVPB 1000 mg/200 mL premix    Order Specific Question:   Vancomycin rationale:    Answer:   Penicillin or cephalosporin allergy   povidone-iodine 10 % swab 2 Application     Signed, Melida Quitter, MD  05/15/2022 2:51 PM    [place]

## 2022-05-16 ENCOUNTER — Encounter (HOSPITAL_COMMUNITY): Payer: Self-pay | Admitting: Cardiovascular Disease

## 2022-05-29 ENCOUNTER — Ambulatory Visit: Payer: No Typology Code available for payment source

## 2022-06-04 ENCOUNTER — Ambulatory Visit: Payer: No Typology Code available for payment source | Attending: Cardiology

## 2022-06-04 ENCOUNTER — Ambulatory Visit (INDEPENDENT_AMBULATORY_CARE_PROVIDER_SITE_OTHER): Payer: No Typology Code available for payment source | Admitting: Internal Medicine

## 2022-06-04 DIAGNOSIS — Z4502 Encounter for adjustment and management of automatic implantable cardiac defibrillator: Secondary | ICD-10-CM

## 2022-06-04 LAB — CUP PACEART INCLINIC DEVICE CHECK
Battery Remaining Longevity: 144 mo
Date Time Interrogation Session: 20240319204804
Implantable Lead Connection Status: 753985
Implantable Lead Connection Status: 753985
Implantable Lead Implant Date: 20140521
Implantable Lead Implant Date: 20140521
Implantable Lead Location: 753859
Implantable Lead Location: 753860
Implantable Lead Model: 4076
Implantable Pulse Generator Implant Date: 20240228

## 2022-06-04 MED ORDER — AMIODARONE HCL 200 MG PO TABS: ORAL_TABLET | ORAL | 3 refills | Status: DC

## 2022-06-04 MED ORDER — METOPROLOL SUCCINATE ER 25 MG PO TB24: 25.0000 mg | ORAL_TABLET | Freq: Every day | ORAL | 3 refills | Status: DC

## 2022-06-04 NOTE — Patient Instructions (Addendum)
   After Your ICD (Implantable Cardiac Defibrillator)    Monitor your defibrillator site for redness, swelling, and drainage. Call the device clinic at [phone] if you experience these symptoms or fever/chills.  Your incision was closed with Steri-strips or staples:  You may shower 7 days after your procedure and wash your incision with soap and water. Avoid lotions, ointments, or perfumes over your incision until it is well-healed.  You may use a hot tub or a pool after your wound check appointment if the incision is completely closed.   There are no other restrictions in arm movement after your wound check appointment.  Your heart rate is much higher on average recently then it should be.  This will also contribute to your increased shortness of breath.  As a result we want you to make the following medication changes:  1.  Increase your amiodarone to taking 2 tablets (400mg  total) twice daily for 2 weeks.  (See [facility] before the end of the 2 week period to determine next steps).  2.  Increase your metoprolol to taking 1 whole (25mg ) tablet daily.    Your ICD is designed to protect you from life threatening heart rhythms. Because of this, you may receive a shock.   1 shock with no symptoms:  Call the office during business hours. 1 shock with symptoms (chest pain, chest pressure, dizziness, lightheadedness, shortness of breath, overall feeling unwell):  Call 911. If you experience 2 or more shocks in 24 hours:  Call 911. If you receive a shock, you should not drive.  [place] DMV - no driving for 6 months if you receive appropriate therapy from your ICD.   ICD Alerts:  Some alerts are vibratory and others beep. These are NOT emergencies. Please call our office to let us know. If this occurs at night or on weekends, it can wait until the next business day. Send a remote transmission.  If your device is capable of reading fluid status (for heart failure), you will be offered monthly monitoring  to review this with you.   Remote monitoring is used to monitor your ICD from home. This monitoring is scheduled every 91 days by our office. It allows [place] to keep an eye on the functioning of your device to ensure it is working properly. You will routinely see your Electrophysiologist annually (more often if necessary).

## 2022-06-04 NOTE — Progress Notes (Signed)
Wound check appointment. Steri-strips removed. Wound without redness or edema. Incision edges approximated, wound well healed. Normal device function. Sensing, and impedances consistent with implant measurements. Device programmed for chronic lead safety margins as patient was a gen change only. Patient has been in AF persistently since 3/11 and frequently since implant.  He has significant SOB and weakness with very poor ventricular rate control.  Presenting shows rapid ventricular response, i was unable to perform RV threshold testing due to v rates up to 150bpm with patient at rest.   Reviewed with Dr. Caryl Comes who had me move patient to his schedule in order to evaluate patient further.   [facility]   Dr. Caryl Comes orders the following for patient: 1.  increase amiodarone to 400mg  bid X 2 weeks (he had been decreased to 200mg  1/2 pill only while ruling out lung toxicity.  Per patient that was ruled out but amio was never increased back - he was unsure why.   2.  BP today is 133/72, Metoprolol also increased from 12.5mg  daily to 25mg  daily.   3. Patient is to contact [place] tomorrow and seek immediate follow up and consideration for a cardioversion.   Patient educated about wound care and shock plan. No lifting restrictions due to gen change only. ROV in 3 months with implanting physician.  Also, given device clinic number to keep [place] updated.  New RX sent in to [facility] in [place] for metoprolol and Amiodarone at patient's request.  Printout with patient's medication changes provided.  Patient verbalizes understanding of changes made today.  See scanned in attachment for full details.

## 2022-06-04 NOTE — Progress Notes (Addendum)
Patient Care Team: Combs, Diona Browner, DO as PCP - General (Geriatric Medicine) Mealor, Yetta Barre, MD as PCP - Electrophysiology (Cardiology)   HPI  Brett Saunders. is a 77 y.o. male seen as an add-on today because of atrial fibrillation.  He has been followed by the [place].  An ICD implant in their 2014 was replaced by Dr. Jannifer Rodney.  Interrogation of that device has demonstrated recurrent episodes of atrial fibrillation self terminating and following device implantation he has been in persistent atrial fibrillation associated with a rapid ventricular rate with a mean rate in the 110-20 range.  Worsening shortness of breath.  Barriers to he has been on amiodarone and low-dose low-dose metoprolol.  Previous concerns of amiodarone lung toxicity seem to have been ruled out  Myoview 2021 showed an EF of 37% and no clear perfusion defects records and Results Reviewed   Past Medical History:  Diagnosis Date   Aortic anomaly    aortic calcification   Arrhythmia    a fib   Atrial fibrillation (HCC)    CAD (coronary artery disease)    CHF (congestive heart failure) (HCC)    Chronic obstructive lung disease (HCC)    COPD (chronic obstructive pulmonary disease) ([place])    Diverticulosis 2010   colonoscopy 08-2008   DVT (deep venous thrombosis) (HCC)    GERD (gastroesophageal reflux disease)    Hemorrhoids    History of atrial fibrillation    Hypertension    Ischemic cardiomyopathy    Lung cancer ([place])    lung   On home oxygen therapy    Peripheral vascular disease ([place])    Stroke ([place])    oct 2012    Past Surgical History:  Procedure Laterality Date   ABDOMINAL AORTOGRAM W/LOWER EXTREMITY N/A 09/13/2021   Procedure: ABDOMINAL AORTOGRAM W/LOWER EXTREMITY;  Surgeon: [name], MD;  Location: [place] [facility];  Service: Cardiovascular;  Laterality: N/A;   CORONARY ANGIOGRAM  2013   After stroke-[facility], [place].   ENDARTERECTOMY Left  10/01/2021   Procedure: LEFT ILIOFEMORAL ENDARTERECTOMY WITH  1 cm X 6 cm BOVINE PATCH;  Surgeon: [name], MD;  Location: [place];  Service: Vascular;  Laterality: Left;   ICD GENERATOR CHANGEOUT N/A 05/15/2022   Procedure: ICD GENERATOR CHANGEOUT;  Surgeon: Melida Quitter, MD;  Location: [place] [facility];  Service: Cardiovascular;  Laterality: N/A;   PACEMAKER PLACEMENT     and defibrillator    No outpatient medications have been marked as taking for the 06/04/22 encounter (Clinical Support) with Deboraha Sprang, MD.    Allergies  Allergen Reactions   Amoxicillin Shortness Of Breath    Has patient had a PCN reaction causing immediate rash, facial/tongue/throat swelling, SOB or lightheadedness with hypotension: Yes Has patient had a PCN reaction causing severe rash involving mucus membranes or skin necrosis: Yes Has patient had a PCN reaction that required hospitalization: Yes Has patient had a PCN reaction occurring within the last 10 years: No If all of the above answers are "NO", then may proceed with Cephalosporin use.   Avelox [Moxifloxacin Hcl] Other (See Comments)    confusion      Review of Systems negative except from HPI and PMH  Physical Exam BP 132/84  Well developed and well nourished in no acute distress HENT normal E scleral and icterus clear Neck Supple JVP flat; carotids brisk and full Decrease breath sounds bilaterally Regular rate and rhythm, no  murmurs gallops or rub Soft with active bowel sounds No clubbing cyanosis none Edema Alert and oriented, grossly normal motor and sensory function Skin Warm and Dry  ECG    CrCl cannot be calculated (Patient's most recent lab result is older than the maximum 21 days allowed.).   Assessment and  Plan  Atrial fibrillation with rapid ventricular response  Nonischemic cardiomyopathy  Implantable defibrillator-Medtronic  Oxygen dependent COPD  Amiodarone therapy  Atrial fibrillation with  rapid rates he needs slowing down.  He may end up coming to AV junction ablation as but for now, we will hope that he can be cardioverted and sustained in sinus rhythm with augmented amiodarone.  We will increase him from now 100 mg a day to 400 mg twice daily for 2 weeks and then back to 200 mg a day.  We have asked him to follow-up with the [facility] so they can expedite cardioversion after he has been loaded.  Although he mentioned to the nurse after I left that he is disinclined towards cardioversion.  In the interim, we will also increase his metoprolol from 12.5 to 25 mg daily.  I am loath to use digoxin in conjunction with amiodarone  Current medicines are reviewed at length with the patient today .  The patient does not have concerns regarding medicines.

## 2022-06-05 ENCOUNTER — Encounter: Payer: Self-pay | Admitting: Internal Medicine

## 2022-06-06 ENCOUNTER — Encounter: Payer: Self-pay | Admitting: Internal Medicine

## 2022-06-06 MED ORDER — AMIODARONE HCL 200 MG PO TABS: ORAL_TABLET | ORAL | 0 refills | Status: DC

## 2022-06-14 DIAGNOSIS — Z95810 Presence of automatic (implantable) cardiac defibrillator: Secondary | ICD-10-CM | POA: Insufficient documentation

## 2022-06-14 DIAGNOSIS — I4891 Unspecified atrial fibrillation: Secondary | ICD-10-CM | POA: Insufficient documentation

## 2022-06-14 DIAGNOSIS — I428 Other cardiomyopathies: Secondary | ICD-10-CM | POA: Insufficient documentation

## 2022-06-17 ENCOUNTER — Telehealth: Payer: Self-pay

## 2022-06-17 ENCOUNTER — Encounter: Payer: Self-pay | Admitting: Internal Medicine

## 2022-06-17 ENCOUNTER — Ambulatory Visit: Payer: Medicare HMO | Attending: Internal Medicine | Admitting: Internal Medicine

## 2022-06-17 VITALS — Ht 68.0 in

## 2022-06-17 DIAGNOSIS — I428 Other cardiomyopathies: Secondary | ICD-10-CM

## 2022-06-17 DIAGNOSIS — Z95810 Presence of automatic (implantable) cardiac defibrillator: Secondary | ICD-10-CM

## 2022-06-17 DIAGNOSIS — I4891 Unspecified atrial fibrillation: Secondary | ICD-10-CM | POA: Diagnosis not present

## 2022-06-17 NOTE — Patient Instructions (Addendum)
Medication Instructions:  Your physician has recommended you make the following change in your medication:   ** Begin Amiodarone 200mg  - 2 tablets by mouth daily.  *If you need a refill on your cardiac medications before your next appointment, please call your pharmacy*   Lab Work: None ordered.  If you have labs (blood work) drawn today and your tests are completely normal, you will receive your results only by: [place] (if you have MyChart) OR A paper copy in the mail If you have any lab test that is abnormal or we need to change your treatment, we will call you to review the results.   Testing/Procedures: None ordered.    Follow-Up: At [facility], you and your health needs are our priority.  As part of our continuing mission to provide you with exceptional heart care, we have created designated Provider Care Teams.  These Care Teams include your primary Cardiologist (physician) and Advanced Practice Providers (APPs -  Physician Assistants and Nurse Practitioners) who all work together to provide you with the care you need, when you need it.  We recommend signing up for the patient portal called "MyChart".  Sign up information is provided on this After Visit Summary.  MyChart is used to connect with patients for Virtual Visits (Telemedicine).  Patients are able to view lab/test results, encounter notes, upcoming appointments, etc.  Non-urgent messages can be sent to your provider as well.   To learn more about what you can do with MyChart, go to [email or web address].    Your next appointment:    As scheduled with Dr Myles Gip

## 2022-06-17 NOTE — Progress Notes (Signed)
Electrophysiology TeleHealth Note      Date:  06/17/2022   ID:  Brett Saunders., DOB [birth year], MRN [id number]  Location: patient's home  Provider location: [address] [place]  Evaluation Performed: Follow-up visit  PCP:  Berneta Levins, DO  Cardiologist:   [place] Electrophysiologist:  [facility]   Chief Complaint:  atrial fib  History of Present Illness:    Brett Kos. is a 77 y.o. male who presents via audio/video conferencing for a telehealth visit today.  Since last being seen in our clinic as an addon  for atrial fibrillation   the patient reports not having made any adjustments in his medications because of concerns of taking too much medicine and his primary EP provider at the [facility], [employer], expressed her concern that it 100 mg a day was too much for somebody who has been on amiodarone for some many years, albeit it 100 mg a day. Moreover, he also chose not to increase his metoprolol.  It is his understanding that the concern expressed at the [place] was that they could make his heart rate "too slow "a little bit puzzling to me as he has a dual-chamber device in place with a lower rate of 60.  I am not sure where the confusion is.  Stress has been inordinate, he thinks this adds to his atrial fibrillation  As noted in previous notes, his atrial fibrillation has been paroxysmal in the past and is now persisting.  Associated with a rapid ventricular rate.  The patient denies symptoms of fevers, chills, cough, or new SOB worrisome for COVID 19.     Past Medical History:  Diagnosis Date   Aortic anomaly    aortic calcification   Arrhythmia    a fib   Atrial fibrillation    CAD (coronary artery disease)    CHF (congestive heart failure)    Chronic obstructive lung disease    COPD (chronic obstructive pulmonary disease)    Diverticulosis 2010   colonoscopy 08-2008   DVT (deep venous thrombosis)    GERD (gastroesophageal reflux disease)     Hemorrhoids    History of atrial fibrillation    Hypertension    Ischemic cardiomyopathy    Lung cancer    lung   On home oxygen therapy    Peripheral vascular disease    Stroke    oct 2012    Past Surgical History:  Procedure Laterality Date   ABDOMINAL AORTOGRAM W/LOWER EXTREMITY N/A 09/13/2021   Procedure: ABDOMINAL AORTOGRAM W/LOWER EXTREMITY;  Surgeon: [name], MD;  Location: [place] [facility];  Service: Cardiovascular;  Laterality: N/A;   CORONARY ANGIOGRAM  2013   After stroke-[facility], [place].   ENDARTERECTOMY Left 10/01/2021   Procedure: LEFT ILIOFEMORAL ENDARTERECTOMY WITH  1 cm X 6 cm BOVINE PATCH;  Surgeon: [name], MD;  Location: [place];  Service: Vascular;  Laterality: Left;   ICD GENERATOR CHANGEOUT N/A 05/15/2022   Procedure: ICD GENERATOR CHANGEOUT;  Surgeon: Melida Quitter, MD;  Location: [place] [facility];  Service: Cardiovascular;  Laterality: N/A;   PACEMAKER PLACEMENT     and defibrillator    Current Outpatient Medications  Medication Sig Dispense Refill   acetaminophen (TYLENOL) 500 MG tablet Take 500 mg by mouth at bedtime.     albuterol (PROVENTIL) (2.5 MG/3ML) 0.083% nebulizer solution Take 2.5 mg by nebulization daily.     albuterol (VENTOLIN HFA) 108 (90 Base) MCG/ACT  inhaler Inhale 2 puffs into the lungs every 4 (four) hours as needed for wheezing or shortness of breath.     amiodarone (PACERONE) 200 MG tablet Take 2 tablets (400mg ) twice daily x 2 weeks, further dosing pending [facility]. 56 tablet 0   apixaban (ELIQUIS) 5 MG TABS tablet Take 2.5 mg by mouth 2 (two) times daily.     furosemide (LASIX) 20 MG tablet Take 20 mg by mouth daily.      Methyl Salicylate-Lido-Menthol 4-4-5 % PTCH Apply 1 patch topically in the morning and at bedtime. 30 patch 0   metoprolol succinate (TOPROL XL) 25 MG 24 hr tablet Take 1 tablet (25 mg total) by mouth daily. 90 tablet 3   mometasone (ASMANEX, 60 METERED DOSES,)  220 MCG/ACT inhaler Inhale 2 puffs into the lungs at bedtime.     Omega-3 Fatty Acids (FISH OIL PO) Take 1 capsule by mouth daily.     omeprazole (PRILOSEC) 20 MG capsule Take 20 mg by mouth daily as needed (Heartburn).     OXYGEN Inhale 3.5 L/min into the lungs at bedtime. Portable during the day     rOPINIRole (REQUIP) 0.25 MG tablet Take 0.25 mg by mouth 2 (two) times daily.     rosuvastatin (CRESTOR) 5 MG tablet Take 5 mg by mouth daily.     sodium chloride 1 g tablet Take 1 g by mouth 3 (three) times daily.     Tiotropium Bromide-Olodaterol 2.5-2.5 MCG/ACT AERS Inhale 2 each into the lungs daily.     triamcinolone ointment (KENALOG) 0.1 % Apply 1 Application topically 2 (two) times daily.     No current facility-administered medications for this visit.    Allergies:   Amoxicillin and Avelox [moxifloxacin hcl]      ROS:  Please see the history of present illness.   All other systems are personally reviewed and negative.    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  Cardioversion will be  deferred and anticoagulation dose will need to be rectified, age, renal function--5 mg twice daily  He outlined a list of concerns that he had related to "overdosing of medicine" and I appreciate his trepidations.   I have reached out to the [place] directly, [employer], and will discuss with her the above.  Repeat echocardiogram will be important but I think it should be deferred until rate control has been accomplished for a number of months    Follow-up: Pending the above    Current medicines are reviewed at length with the  patient today.   The patient  has concerns regarding his medicines since.  The following changes were made today: To take the amiodarone 400 mg daily,  Labs/ tests ordered today include:  No orders of the defined types were placed in this encounter.     Today, I have spent 22 minutes with the patient with telehealth technology discussing the above.  Signed, Virl Axe, MD  06/17/2022 10:01 AM     [address] [place] [address] [phone] (office) [phone] (fax)

## 2022-06-17 NOTE — Telephone Encounter (Signed)
The patient was called and asked if he wanted to do his phone visit around 10 am because Dr. Caryl Comes had some cancellations and the patient said yes.     Pt understands that although there may be some limitations with this type of visit, we will take all precautions to reduce any security or privacy concerns.  Pt understands that this will be treated like an in office visit and we will file with pt's insurance, and there may be a patient responsible charge related to this service.

## 2022-06-21 ENCOUNTER — Other Ambulatory Visit: Payer: Self-pay

## 2022-06-21 ENCOUNTER — Emergency Department (HOSPITAL_COMMUNITY): Payer: Medicare HMO

## 2022-06-21 ENCOUNTER — Inpatient Hospital Stay (HOSPITAL_COMMUNITY)
Admission: EM | Admit: 2022-06-21 | Discharge: 2022-07-17 | DRG: 871 | Disposition: E | Payer: Medicare HMO | Attending: Internal Medicine | Admitting: Internal Medicine

## 2022-06-21 DIAGNOSIS — J9621 Acute and chronic respiratory failure with hypoxia: Secondary | ICD-10-CM | POA: Diagnosis not present

## 2022-06-21 DIAGNOSIS — Z8673 Personal history of transient ischemic attack (TIA), and cerebral infarction without residual deficits: Secondary | ICD-10-CM

## 2022-06-21 DIAGNOSIS — Z7901 Long term (current) use of anticoagulants: Secondary | ICD-10-CM

## 2022-06-21 DIAGNOSIS — Z950 Presence of cardiac pacemaker: Secondary | ICD-10-CM

## 2022-06-21 DIAGNOSIS — I255 Ischemic cardiomyopathy: Secondary | ICD-10-CM | POA: Diagnosis present

## 2022-06-21 DIAGNOSIS — J441 Chronic obstructive pulmonary disease with (acute) exacerbation: Secondary | ICD-10-CM | POA: Diagnosis not present

## 2022-06-21 DIAGNOSIS — I739 Peripheral vascular disease, unspecified: Secondary | ICD-10-CM | POA: Diagnosis present

## 2022-06-21 DIAGNOSIS — I5033 Acute on chronic diastolic (congestive) heart failure: Secondary | ICD-10-CM | POA: Diagnosis not present

## 2022-06-21 DIAGNOSIS — Z85118 Personal history of other malignant neoplasm of bronchus and lung: Secondary | ICD-10-CM

## 2022-06-21 DIAGNOSIS — F17210 Nicotine dependence, cigarettes, uncomplicated: Secondary | ICD-10-CM | POA: Diagnosis present

## 2022-06-21 DIAGNOSIS — R570 Cardiogenic shock: Secondary | ICD-10-CM | POA: Diagnosis not present

## 2022-06-21 DIAGNOSIS — Z888 Allergy status to other drugs, medicaments and biological substances status: Secondary | ICD-10-CM

## 2022-06-21 DIAGNOSIS — E871 Hypo-osmolality and hyponatremia: Secondary | ICD-10-CM | POA: Diagnosis present

## 2022-06-21 DIAGNOSIS — Z9981 Dependence on supplemental oxygen: Secondary | ICD-10-CM

## 2022-06-21 DIAGNOSIS — Z880 Allergy status to penicillin: Secondary | ICD-10-CM

## 2022-06-21 DIAGNOSIS — Z79899 Other long term (current) drug therapy: Secondary | ICD-10-CM

## 2022-06-21 DIAGNOSIS — R0602 Shortness of breath: Secondary | ICD-10-CM

## 2022-06-21 DIAGNOSIS — J440 Chronic obstructive pulmonary disease with acute lower respiratory infection: Secondary | ICD-10-CM | POA: Diagnosis present

## 2022-06-21 DIAGNOSIS — Z1152 Encounter for screening for COVID-19: Secondary | ICD-10-CM

## 2022-06-21 DIAGNOSIS — A419 Sepsis, unspecified organism: Principal | ICD-10-CM | POA: Diagnosis present

## 2022-06-21 DIAGNOSIS — Z7951 Long term (current) use of inhaled steroids: Secondary | ICD-10-CM

## 2022-06-21 DIAGNOSIS — Z66 Do not resuscitate: Secondary | ICD-10-CM | POA: Diagnosis present

## 2022-06-21 DIAGNOSIS — R6520 Severe sepsis without septic shock: Secondary | ICD-10-CM | POA: Diagnosis not present

## 2022-06-21 DIAGNOSIS — K219 Gastro-esophageal reflux disease without esophagitis: Secondary | ICD-10-CM | POA: Diagnosis present

## 2022-06-21 DIAGNOSIS — J9622 Acute and chronic respiratory failure with hypercapnia: Secondary | ICD-10-CM | POA: Diagnosis present

## 2022-06-21 DIAGNOSIS — E875 Hyperkalemia: Secondary | ICD-10-CM | POA: Diagnosis present

## 2022-06-21 DIAGNOSIS — J189 Pneumonia, unspecified organism: Secondary | ICD-10-CM | POA: Diagnosis present

## 2022-06-21 DIAGNOSIS — I4891 Unspecified atrial fibrillation: Secondary | ICD-10-CM

## 2022-06-21 DIAGNOSIS — Z515 Encounter for palliative care: Secondary | ICD-10-CM

## 2022-06-21 DIAGNOSIS — I4819 Other persistent atrial fibrillation: Secondary | ICD-10-CM | POA: Diagnosis present

## 2022-06-21 DIAGNOSIS — I2510 Atherosclerotic heart disease of native coronary artery without angina pectoris: Secondary | ICD-10-CM | POA: Diagnosis present

## 2022-06-21 DIAGNOSIS — R6521 Severe sepsis with septic shock: Secondary | ICD-10-CM | POA: Diagnosis not present

## 2022-06-21 DIAGNOSIS — I110 Hypertensive heart disease with heart failure: Secondary | ICD-10-CM | POA: Diagnosis present

## 2022-06-21 DIAGNOSIS — N179 Acute kidney failure, unspecified: Secondary | ICD-10-CM | POA: Diagnosis present

## 2022-06-21 DIAGNOSIS — J439 Emphysema, unspecified: Secondary | ICD-10-CM | POA: Diagnosis present

## 2022-06-21 LAB — COMPREHENSIVE METABOLIC PANEL
ALT: 14 U/L (ref 0–44)
AST: 22 U/L (ref 15–41)
Albumin: 3 g/dL — ABNORMAL LOW (ref 3.5–5.0)
Alkaline Phosphatase: 85 U/L (ref 38–126)
Anion gap: 10 (ref 5–15)
BUN: 8 mg/dL (ref 8–23)
CO2: 33 mmol/L — ABNORMAL HIGH (ref 22–32)
Calcium: 8.8 mg/dL — ABNORMAL LOW (ref 8.9–10.3)
Chloride: 87 mmol/L — ABNORMAL LOW (ref 98–111)
Creatinine, Ser: 0.91 mg/dL (ref 0.61–1.24)
GFR, Estimated: >60 mL/min (ref >60)
Glucose, Bld: 119 mg/dL — ABNORMAL HIGH (ref 70–99)
Potassium: 4 mmol/L (ref 3.5–5.1)
Sodium: 130 mmol/L — ABNORMAL LOW (ref 135–145)
Total Bilirubin: 0.4 mg/dL (ref 0.3–1.2)
Total Protein: 6.7 g/dL (ref 6.5–8.1)

## 2022-06-21 LAB — I-STAT VENOUS BLOOD GAS, ED
Acid-Base Excess: 9 mmol/L — ABNORMAL HIGH (ref 0.0–2.0)
Bicarbonate: 36.7 mmol/L — ABNORMAL HIGH (ref 20.0–28.0)
Calcium, Ion: 1.06 mmol/L — ABNORMAL LOW (ref 1.15–1.40)
HCT: 37 % — ABNORMAL LOW (ref 39.0–52.0)
Hemoglobin: 12.6 g/dL — ABNORMAL LOW (ref 13.0–17.0)
O2 Saturation: 98 %
Patient temperature: 37
Potassium: 4 mmol/L (ref 3.5–5.1)
Sodium: 129 mmol/L — ABNORMAL LOW (ref 135–145)
TCO2: 39 mmol/L — ABNORMAL HIGH (ref 22–32)
pCO2, Ven: 62 mmHg — ABNORMAL HIGH (ref 44–60)
pH, Ven: 7.38 (ref 7.25–7.43)
pO2, Ven: 111 mmHg — ABNORMAL HIGH (ref 32–45)

## 2022-06-21 LAB — CBC WITH DIFFERENTIAL/PLATELET
Abs Immature Granulocytes: 0.06 10*3/uL (ref 0.00–0.07)
Basophils Absolute: 0.1 10*3/uL (ref 0.0–0.1)
Basophils Relative: 1 %
Eosinophils Absolute: 0.2 10*3/uL (ref 0.0–0.5)
Eosinophils Relative: 2 %
HCT: 35.6 % — ABNORMAL LOW (ref 39.0–52.0)
Hemoglobin: 11.2 g/dL — ABNORMAL LOW (ref 13.0–17.0)
Immature Granulocytes: 0 %
Lymphocytes Relative: 4 %
Lymphs Abs: 0.5 10*3/uL — ABNORMAL LOW (ref 0.7–4.0)
MCH: 30.1 pg (ref 26.0–34.0)
MCHC: 31.5 g/dL (ref 30.0–36.0)
MCV: 95.7 fL (ref 80.0–100.0)
Monocytes Absolute: 0.8 10*3/uL (ref 0.1–1.0)
Monocytes Relative: 6 %
Neutro Abs: 12.1 10*3/uL — ABNORMAL HIGH (ref 1.7–7.7)
Neutrophils Relative %: 87 %
Platelets: 244 10*3/uL (ref 150–400)
RBC: 3.72 MIL/uL — ABNORMAL LOW (ref 4.22–5.81)
RDW: 13.5 % (ref 11.5–15.5)
WBC: 13.7 10*3/uL — ABNORMAL HIGH (ref 4.0–10.5)
nRBC: 0 % (ref 0.0–0.2)

## 2022-06-21 LAB — RESP PANEL BY RT-PCR (RSV, FLU A&B, COVID)  RVPGX2
Influenza A by PCR: NEGATIVE
Influenza B by PCR: NEGATIVE
Resp Syncytial Virus by PCR: NEGATIVE
SARS Coronavirus 2 by RT PCR: NEGATIVE

## 2022-06-21 LAB — BRAIN NATRIURETIC PEPTIDE: B Natriuretic Peptide: 808.2 pg/mL — ABNORMAL HIGH (ref 0.0–100.0)

## 2022-06-21 LAB — CBG MONITORING, ED: Glucose-Capillary: 121 mg/dL — ABNORMAL HIGH (ref 70–99)

## 2022-06-21 MED ORDER — DOXYCYCLINE HYCLATE 100 MG PO TABS
100.0000 mg | ORAL_TABLET | Freq: Once | ORAL | Status: AC
  Administered 2022-06-21: 100 mg via ORAL
  Filled 2022-06-21: qty 1

## 2022-06-21 MED ORDER — IPRATROPIUM-ALBUTEROL 0.5-2.5 (3) MG/3ML IN SOLN
3.0000 mL | Freq: Once | RESPIRATORY_TRACT | Status: AC
  Administered 2022-06-21: 3 mL via RESPIRATORY_TRACT
  Filled 2022-06-21: qty 3

## 2022-06-21 MED ORDER — METHYLPREDNISOLONE SODIUM SUCC 125 MG IJ SOLR
80.0000 mg | Freq: Once | INTRAMUSCULAR | Status: AC
  Administered 2022-06-21: 80 mg via INTRAVENOUS
  Filled 2022-06-21: qty 2

## 2022-06-21 MED ORDER — ALBUTEROL SULFATE (2.5 MG/3ML) 0.083% IN NEBU
5.0000 mg | INHALATION_SOLUTION | Freq: Once | RESPIRATORY_TRACT | Status: AC
  Administered 2022-06-21: 5 mg via RESPIRATORY_TRACT
  Filled 2022-06-21: qty 6

## 2022-06-21 MED ORDER — DILTIAZEM HCL-DEXTROSE 125-5 MG/125ML-% IV SOLN (PREMIX)
5.0000 mg/h | INTRAVENOUS | Status: DC
  Administered 2022-06-21 – 2022-06-22 (×2): 5 mg/h via INTRAVENOUS
  Filled 2022-06-21 (×2): qty 125

## 2022-06-21 NOTE — ED Triage Notes (Signed)
Pt BIBA to ED for SOB and palpitations x1 hr ago, coming from home. Wears 4L [place] baseline. Hx afib and COPD. AAOx4.   EMS Vitals 130 HR 132/86  BGL

## 2022-06-21 NOTE — ED Notes (Signed)
Wife has called to check on husband, wanted [place] to know his Cardiologist is Dr.Kline with [facility], they just replaced his pacemaker 3+weeks ago. For questions please [phone]

## 2022-06-22 ENCOUNTER — Encounter (HOSPITAL_COMMUNITY): Payer: Self-pay | Admitting: Family Medicine

## 2022-06-22 DIAGNOSIS — A419 Sepsis, unspecified organism: Secondary | ICD-10-CM | POA: Diagnosis present

## 2022-06-22 DIAGNOSIS — I739 Peripheral vascular disease, unspecified: Secondary | ICD-10-CM | POA: Diagnosis not present

## 2022-06-22 DIAGNOSIS — E871 Hypo-osmolality and hyponatremia: Secondary | ICD-10-CM | POA: Diagnosis present

## 2022-06-22 DIAGNOSIS — R0602 Shortness of breath: Secondary | ICD-10-CM | POA: Diagnosis present

## 2022-06-22 DIAGNOSIS — I4891 Unspecified atrial fibrillation: Secondary | ICD-10-CM | POA: Diagnosis not present

## 2022-06-22 DIAGNOSIS — I255 Ischemic cardiomyopathy: Secondary | ICD-10-CM | POA: Diagnosis present

## 2022-06-22 DIAGNOSIS — J9621 Acute and chronic respiratory failure with hypoxia: Secondary | ICD-10-CM | POA: Insufficient documentation

## 2022-06-22 DIAGNOSIS — J189 Pneumonia, unspecified organism: Secondary | ICD-10-CM | POA: Diagnosis present

## 2022-06-22 DIAGNOSIS — R6520 Severe sepsis without septic shock: Secondary | ICD-10-CM | POA: Diagnosis not present

## 2022-06-22 DIAGNOSIS — I2510 Atherosclerotic heart disease of native coronary artery without angina pectoris: Secondary | ICD-10-CM | POA: Diagnosis present

## 2022-06-22 DIAGNOSIS — I4819 Other persistent atrial fibrillation: Secondary | ICD-10-CM | POA: Diagnosis present

## 2022-06-22 DIAGNOSIS — Z9981 Dependence on supplemental oxygen: Secondary | ICD-10-CM | POA: Diagnosis not present

## 2022-06-22 DIAGNOSIS — I5033 Acute on chronic diastolic (congestive) heart failure: Secondary | ICD-10-CM | POA: Diagnosis present

## 2022-06-22 DIAGNOSIS — I110 Hypertensive heart disease with heart failure: Secondary | ICD-10-CM | POA: Diagnosis present

## 2022-06-22 DIAGNOSIS — N179 Acute kidney failure, unspecified: Secondary | ICD-10-CM | POA: Diagnosis present

## 2022-06-22 DIAGNOSIS — R9431 Abnormal electrocardiogram [ECG] [EKG]: Secondary | ICD-10-CM | POA: Insufficient documentation

## 2022-06-22 DIAGNOSIS — Z66 Do not resuscitate: Secondary | ICD-10-CM | POA: Diagnosis present

## 2022-06-22 DIAGNOSIS — E875 Hyperkalemia: Secondary | ICD-10-CM | POA: Diagnosis present

## 2022-06-22 DIAGNOSIS — F17210 Nicotine dependence, cigarettes, uncomplicated: Secondary | ICD-10-CM | POA: Diagnosis present

## 2022-06-22 DIAGNOSIS — R570 Cardiogenic shock: Secondary | ICD-10-CM | POA: Diagnosis not present

## 2022-06-22 DIAGNOSIS — Z1152 Encounter for screening for COVID-19: Secondary | ICD-10-CM | POA: Diagnosis not present

## 2022-06-22 DIAGNOSIS — J9622 Acute and chronic respiratory failure with hypercapnia: Secondary | ICD-10-CM | POA: Diagnosis present

## 2022-06-22 DIAGNOSIS — J439 Emphysema, unspecified: Secondary | ICD-10-CM | POA: Diagnosis present

## 2022-06-22 DIAGNOSIS — J441 Chronic obstructive pulmonary disease with (acute) exacerbation: Secondary | ICD-10-CM | POA: Diagnosis present

## 2022-06-22 DIAGNOSIS — R6521 Severe sepsis with septic shock: Secondary | ICD-10-CM | POA: Diagnosis not present

## 2022-06-22 DIAGNOSIS — J440 Chronic obstructive pulmonary disease with acute lower respiratory infection: Secondary | ICD-10-CM | POA: Diagnosis present

## 2022-06-22 DIAGNOSIS — Z515 Encounter for palliative care: Secondary | ICD-10-CM | POA: Diagnosis not present

## 2022-06-22 LAB — BLOOD GAS, ARTERIAL
Acid-Base Excess: 5.6 mmol/L — ABNORMAL HIGH (ref 0.0–2.0)
Bicarbonate: 36 mmol/L — ABNORMAL HIGH (ref 20.0–28.0)
O2 Saturation: 99.2 %
Patient temperature: 36.4
pCO2 arterial: 82 mmHg (ref 32–48)
pH, Arterial: 7.25 — ABNORMAL LOW (ref 7.35–7.45)
pO2, Arterial: 221 mmHg — ABNORMAL HIGH (ref 83–108)

## 2022-06-22 LAB — BASIC METABOLIC PANEL
Anion gap: 13 (ref 5–15)
BUN: 11 mg/dL (ref 8–23)
CO2: 31 mmol/L (ref 22–32)
Calcium: 8.8 mg/dL — ABNORMAL LOW (ref 8.9–10.3)
Chloride: 88 mmol/L — ABNORMAL LOW (ref 98–111)
Creatinine, Ser: 1.18 mg/dL (ref 0.61–1.24)
GFR, Estimated: >60 mL/min (ref >60)
Glucose, Bld: 313 mg/dL — ABNORMAL HIGH (ref 70–99)
Potassium: 3.9 mmol/L (ref 3.5–5.1)
Sodium: 132 mmol/L — ABNORMAL LOW (ref 135–145)

## 2022-06-22 LAB — BLOOD GAS, VENOUS
Acid-Base Excess: 9.6 mmol/L — ABNORMAL HIGH (ref 0.0–2.0)
Bicarbonate: 39.4 mmol/L — ABNORMAL HIGH (ref 20.0–28.0)
O2 Saturation: 33.3 %
Patient temperature: 37
pCO2, Ven: 80 mmHg (ref 44–60)
pH, Ven: 7.3 (ref 7.25–7.43)
pO2, Ven: <31 mmHg — CL (ref 32–45)

## 2022-06-22 LAB — GLUCOSE, CAPILLARY
Glucose-Capillary: 253 mg/dL — ABNORMAL HIGH (ref 70–99)
Glucose-Capillary: 156 mg/dL — ABNORMAL HIGH (ref 70–99)
Glucose-Capillary: 142 mg/dL — ABNORMAL HIGH (ref 70–99)
Glucose-Capillary: 161 mg/dL — ABNORMAL HIGH (ref 70–99)
Glucose-Capillary: 165 mg/dL — ABNORMAL HIGH (ref 70–99)

## 2022-06-22 LAB — CBC
HCT: 36.7 % — ABNORMAL LOW (ref 39.0–52.0)
Hemoglobin: 11.2 g/dL — ABNORMAL LOW (ref 13.0–17.0)
MCH: 29.8 pg (ref 26.0–34.0)
MCHC: 30.5 g/dL (ref 30.0–36.0)
MCV: 97.6 fL (ref 80.0–100.0)
Platelets: 253 10*3/uL (ref 150–400)
RBC: 3.76 MIL/uL — ABNORMAL LOW (ref 4.22–5.81)
RDW: 13.6 % (ref 11.5–15.5)
WBC: 19.3 10*3/uL — ABNORMAL HIGH (ref 4.0–10.5)
nRBC: 0 % (ref 0.0–0.2)

## 2022-06-22 LAB — MAGNESIUM: Magnesium: 2 mg/dL (ref 1.7–2.4)

## 2022-06-22 MED ORDER — PANTOPRAZOLE SODIUM 40 MG PO TBEC
40.0000 mg | DELAYED_RELEASE_TABLET | Freq: Every day | ORAL | Status: DC
  Administered 2022-06-22 – 2022-06-23 (×2): 40 mg via ORAL
  Filled 2022-06-22 (×2): qty 1

## 2022-06-22 MED ORDER — DOXYCYCLINE HYCLATE 100 MG PO TABS
100.0000 mg | ORAL_TABLET | Freq: Two times a day (BID) | ORAL | Status: DC
  Administered 2022-06-22 – 2022-06-23 (×3): 100 mg via ORAL
  Filled 2022-06-22 (×3): qty 1

## 2022-06-22 MED ORDER — INSULIN ASPART 100 UNIT/ML IJ SOLN: 0.0000 [IU] | Freq: Every day | INTRAMUSCULAR | Status: DC

## 2022-06-22 MED ORDER — ACETAMINOPHEN 325 MG PO TABS: 650.0000 mg | ORAL_TABLET | Freq: Four times a day (QID) | ORAL | Status: DC | PRN

## 2022-06-22 MED ORDER — INSULIN ASPART 100 UNIT/ML IJ SOLN
0.0000 [IU] | Freq: Three times a day (TID) | INTRAMUSCULAR | Status: DC
  Administered 2022-06-22 (×2): 1 [IU] via SUBCUTANEOUS

## 2022-06-22 MED ORDER — METHYLPREDNISOLONE SODIUM SUCC 125 MG IJ SOLR
125.0000 mg | Freq: Two times a day (BID) | INTRAMUSCULAR | Status: DC
  Administered 2022-06-22: 125 mg via INTRAVENOUS
  Filled 2022-06-22: qty 2

## 2022-06-22 MED ORDER — POTASSIUM CHLORIDE CRYS ER 20 MEQ PO TBCR
20.0000 meq | EXTENDED_RELEASE_TABLET | Freq: Once | ORAL | Status: AC
  Administered 2022-06-22: 20 meq via ORAL
  Filled 2022-06-22: qty 1

## 2022-06-22 MED ORDER — PREDNISONE 20 MG PO TABS: 40.0000 mg | ORAL_TABLET | Freq: Every day | ORAL | Status: DC

## 2022-06-22 MED ORDER — ROPINIROLE HCL 0.5 MG PO TABS
0.2500 mg | ORAL_TABLET | Freq: Two times a day (BID) | ORAL | Status: DC
  Administered 2022-06-22 – 2022-06-23 (×3): 0.25 mg via ORAL
  Filled 2022-06-22 (×3): qty 1

## 2022-06-22 MED ORDER — POLYETHYLENE GLYCOL 3350 17 G PO PACK: 17.0000 g | PACK | Freq: Every day | ORAL | Status: DC | PRN

## 2022-06-22 MED ORDER — ROSUVASTATIN CALCIUM 5 MG PO TABS
5.0000 mg | ORAL_TABLET | Freq: Every day | ORAL | Status: DC
  Administered 2022-06-22 – 2022-06-23 (×2): 5 mg via ORAL
  Filled 2022-06-22 (×2): qty 1

## 2022-06-22 MED ORDER — APIXABAN 5 MG PO TABS
5.0000 mg | ORAL_TABLET | Freq: Two times a day (BID) | ORAL | Status: DC
  Administered 2022-06-22 – 2022-06-23 (×4): 5 mg via ORAL
  Filled 2022-06-22 (×4): qty 1

## 2022-06-22 MED ORDER — AMIODARONE HCL 200 MG PO TABS
400.0000 mg | ORAL_TABLET | Freq: Two times a day (BID) | ORAL | Status: DC
  Administered 2022-06-22 – 2022-06-23 (×3): 400 mg via ORAL
  Filled 2022-06-22 (×3): qty 2

## 2022-06-22 MED ORDER — AMIODARONE HCL 200 MG PO TABS: 400.0000 mg | ORAL_TABLET | Freq: Every day | ORAL | Status: DC

## 2022-06-22 MED ORDER — METHYLPREDNISOLONE SODIUM SUCC 40 MG IJ SOLR
40.0000 mg | Freq: Two times a day (BID) | INTRAMUSCULAR | Status: DC
  Administered 2022-06-23: 40 mg via INTRAVENOUS
  Filled 2022-06-22: qty 1

## 2022-06-22 MED ORDER — OMEGA-3-ACID ETHYL ESTERS 1 G PO CAPS
1.0000 g | ORAL_CAPSULE | Freq: Two times a day (BID) | ORAL | Status: DC
  Administered 2022-06-22: 1 g via ORAL
  Filled 2022-06-22 (×2): qty 1

## 2022-06-22 MED ORDER — SODIUM CHLORIDE 1 G PO TABS
1.0000 g | ORAL_TABLET | Freq: Three times a day (TID) | ORAL | Status: DC
  Administered 2022-06-22 – 2022-06-23 (×3): 1 g via ORAL
  Filled 2022-06-22 (×3): qty 1

## 2022-06-22 MED ORDER — METOPROLOL SUCCINATE ER 25 MG PO TB24
25.0000 mg | ORAL_TABLET | Freq: Every day | ORAL | Status: DC
  Administered 2022-06-22 – 2022-06-23 (×2): 25 mg via ORAL
  Filled 2022-06-22 (×2): qty 1

## 2022-06-22 MED ORDER — ALBUTEROL SULFATE (2.5 MG/3ML) 0.083% IN NEBU
2.5000 mg | INHALATION_SOLUTION | RESPIRATORY_TRACT | Status: DC | PRN
  Administered 2022-06-22: 2.5 mg via RESPIRATORY_TRACT
  Filled 2022-06-22: qty 3

## 2022-06-22 MED ORDER — IPRATROPIUM-ALBUTEROL 0.5-2.5 (3) MG/3ML IN SOLN
3.0000 mL | Freq: Four times a day (QID) | RESPIRATORY_TRACT | Status: DC
  Administered 2022-06-22 – 2022-06-23 (×6): 3 mL via RESPIRATORY_TRACT
  Filled 2022-06-22 (×5): qty 3

## 2022-06-22 MED ORDER — IPRATROPIUM-ALBUTEROL 0.5-2.5 (3) MG/3ML IN SOLN
3.0000 mL | RESPIRATORY_TRACT | Status: DC | PRN
  Administered 2022-06-23: 3 mL via RESPIRATORY_TRACT
  Filled 2022-06-22 (×2): qty 3

## 2022-06-22 MED ORDER — SODIUM CHLORIDE 0.9% FLUSH
3.0000 mL | Freq: Two times a day (BID) | INTRAVENOUS | Status: DC
  Administered 2022-06-22 – 2022-06-23 (×3): 3 mL via INTRAVENOUS

## 2022-06-22 MED ORDER — ACETAMINOPHEN 650 MG RE SUPP: 650.0000 mg | Freq: Four times a day (QID) | RECTAL | Status: DC | PRN

## 2022-06-22 MED ORDER — FUROSEMIDE 10 MG/ML IJ SOLN
20.0000 mg | Freq: Two times a day (BID) | INTRAMUSCULAR | Status: DC
  Administered 2022-06-22 (×2): 20 mg via INTRAVENOUS
  Filled 2022-06-22 (×2): qty 2

## 2022-06-22 MED ORDER — OXYCODONE HCL 5 MG PO TABS: 5.0000 mg | ORAL_TABLET | ORAL | Status: DC | PRN

## 2022-06-22 MED ORDER — DILTIAZEM LOAD VIA INFUSION
10.0000 mg | Freq: Once | INTRAVENOUS | Status: AC
  Administered 2022-06-22: 10 mg via INTRAVENOUS
  Filled 2022-06-22: qty 10

## 2022-06-22 MED ORDER — MOMETASONE FURO-FORMOTEROL FUM 200-5 MCG/ACT IN AERO
2.0000 | INHALATION_SPRAY | Freq: Two times a day (BID) | RESPIRATORY_TRACT | Status: DC
  Filled 2022-06-22 (×2): qty 8.8

## 2022-06-22 NOTE — Assessment & Plan Note (Signed)
This morning with no signs of volume overload.  Plan to hold on furosemide for now. Last echocardiogram from 2020 with preserved LV systolic function.  Plan to repeat echocardiogram when atrial fibrillation better controlled.

## 2022-06-22 NOTE — ED Notes (Signed)
ED TO INPATIENT HANDOFF REPORT  ED Nurse Name and Phone #: Hyden Soley  S Name/Age/Gender Brett Saunders. 77 y.o. male Room/Bed: 020C/020C  Code Status   Code Status: Full Code  Home/SNF/Other Home Patient oriented to: self, place, time, and situation Is this baseline? Yes   Triage Complete: Triage complete  Chief Complaint COPD exacerbation [J44.1]  Triage Note Pt BIBA to ED for SOB and palpitations x1 hr ago, coming from home. Wears 4L [place] baseline. Hx afib and COPD. AAOx4.   EMS Vitals 130 HR 132/86  BGL   Allergies Allergies  Allergen Reactions   Amoxicillin Shortness Of Breath    Has patient had a PCN reaction causing immediate rash, facial/tongue/throat swelling, SOB or lightheadedness with hypotension: Yes Has patient had a PCN reaction causing severe rash involving mucus membranes or skin necrosis: Yes Has patient had a PCN reaction that required hospitalization: Yes Has patient had a PCN reaction occurring within the last 10 years: No If all of the above answers are "NO", then may proceed with Cephalosporin use.   Avelox [Moxifloxacin Hcl] Other (See Comments)    confusion    Level of Care/Admitting Diagnosis ED Disposition     ED Disposition  Admit   Condition  --   Comment  Hospital Area: [facility] [[id number]]  Level of Care: Progressive [102]  Admit to Progressive based on following criteria: CARDIOVASCULAR & THORACIC of moderate stability with acute coronary syndrome symptoms/low risk myocardial infarction/hypertensive urgency/arrhythmias/heart failure potentially compromising stability and stable post cardiovascular intervention patients.  May admit patient to [facility] or [facility] if equivalent level of care is available:: No  Covid Evaluation: Confirmed COVID Negative  Diagnosis: COPD exacerbation [331795]  Admitting Physician: Briscoe Deutscher [[id number]]  Attending Physician: Briscoe Deutscher [[id number]]  Certification:: I  certify this patient will need inpatient services for at least 2 midnights  Estimated Length of Stay: 3          B Medical/Surgery History Past Medical History:  Diagnosis Date   Aortic anomaly    aortic calcification   Arrhythmia    a fib   Atrial fibrillation    CAD (coronary artery disease)    CHF (congestive heart failure)    Chronic obstructive lung disease    COPD (chronic obstructive pulmonary disease)    Diverticulosis 2010   colonoscopy 08-2008   DVT (deep venous thrombosis)    GERD (gastroesophageal reflux disease)    Hemorrhoids    History of atrial fibrillation    Hypertension    Ischemic cardiomyopathy    Lung cancer    lung   On home oxygen therapy    Peripheral vascular disease    Stroke    oct 2012   Past Surgical History:  Procedure Laterality Date   ABDOMINAL AORTOGRAM W/LOWER EXTREMITY N/A 09/13/2021   Procedure: ABDOMINAL AORTOGRAM W/LOWER EXTREMITY;  Surgeon: Cephus Shelling, MD;  Location: [facility] INVASIVE CV LAB;  Service: Cardiovascular;  Laterality: N/A;   CORONARY ANGIOGRAM  2013   After stroke-[facility], [place].   ENDARTERECTOMY Left 10/01/2021   Procedure: LEFT ILIOFEMORAL ENDARTERECTOMY WITH  1 cm X 6 cm BOVINE PATCH;  Surgeon: Cephus Shelling, MD;  Location: [facility] OR;  Service: Vascular;  Laterality: Left;   ICD GENERATOR CHANGEOUT N/A 05/15/2022   Procedure: ICD GENERATOR CHANGEOUT;  Surgeon: Maurice Small, MD;  Location: [facility] INVASIVE CV LAB;  Service: Cardiovascular;  Laterality: N/A;   PACEMAKER PLACEMENT  and defibrillator     A IV Location/Drains/Wounds Patient Lines/Drains/Airways Status     Active Line/Drains/Airways     Name Placement date Placement time Site Days   Peripheral IV Jul 11, 2022 20 G Left Antecubital July 11, 2022  2021  Antecubital  1            Intake/Output Last 24 hours No intake or output data in the 24 hours ending 06/22/22 0130  Labs/Imaging Results for orders placed or  performed during the hospital encounter of 07-11-22 (from the past 48 hour(s))  CBC with Differential     Status: Abnormal   Collection Time: 07/11/2022  8:22 PM  Result Value Ref Range   WBC 13.7 (H) 4.0 - 10.5 K/uL   RBC 3.72 (L) 4.22 - 5.81 MIL/uL   Hemoglobin 11.2 (L) 13.0 - 17.0 g/dL   HCT 16.1 (L) 09.6 - 04.5 %   MCV 95.7 80.0 - 100.0 fL   MCH 30.1 26.0 - 34.0 pg   MCHC 31.5 30.0 - 36.0 g/dL   RDW 40.9 81.1 - 91.4 %   Platelets 244 150 - 400 K/uL   nRBC 0.0 0.0 - 0.2 %   Neutrophils Relative % 87 %   Neutro Abs 12.1 (H) 1.7 - 7.7 K/uL   Lymphocytes Relative 4 %   Lymphs Abs 0.5 (L) 0.7 - 4.0 K/uL   Monocytes Relative 6 %   Monocytes Absolute 0.8 0.1 - 1.0 K/uL   Eosinophils Relative 2 %   Eosinophils Absolute 0.2 0.0 - 0.5 K/uL   Basophils Relative 1 %   Basophils Absolute 0.1 0.0 - 0.1 K/uL   Immature Granulocytes 0 %   Abs Immature Granulocytes 0.06 0.00 - 0.07 K/uL    Comment: Performed at [facility], [address] [place], [place] [address]  Comprehensive metabolic panel     Status: Abnormal   Collection Time: 07-11-22  8:22 PM  Result Value Ref Range   Sodium 130 (L) 135 - 145 mmol/L   Potassium 4.0 3.5 - 5.1 mmol/L   Chloride 87 (L) 98 - 111 mmol/L   CO2 33 (H) 22 - 32 mmol/L   Glucose, Bld 119 (H) 70 - 99 mg/dL    Comment: Glucose reference range applies only to samples taken after fasting for at least 8 hours.   BUN 8 8 - 23 mg/dL   Creatinine, Ser 6.21 0.61 - 1.24 mg/dL   Calcium 8.8 (L) 8.9 - 10.3 mg/dL   Total Protein 6.7 6.5 - 8.1 g/dL   Albumin 3.0 (L) 3.5 - 5.0 g/dL   AST 22 15 - 41 U/L   ALT 14 0 - 44 U/L   Alkaline Phosphatase 85 38 - 126 U/L   Total Bilirubin 0.4 0.3 - 1.2 mg/dL   GFR, Estimated >30 >86 mL/min    Comment: (NOTE) Calculated using the CKD-EPI Creatinine Equation (2021)    Anion gap 10 5 - 15    Comment: Performed at [facility], [address], [place], [place] [address]  Brain natriuretic peptide     Status:  Abnormal   Collection Time: 07-11-22  8:22 PM  Result Value Ref Range   B Natriuretic Peptide 808.2 (H) 0.0 - 100.0 pg/mL    Comment: Performed at [facility], [address] [place], [place] [address]  Resp panel by RT-PCR (RSV, Flu A&B, Covid) Anterior Nasal Swab     Status: None   Collection Time: 11-Jul-2022  8:25 PM  Specimen: Anterior Nasal Swab  Result Value Ref Range   SARS Coronavirus 2 by RT PCR NEGATIVE NEGATIVE   Influenza A by PCR NEGATIVE NEGATIVE   Influenza B by PCR NEGATIVE NEGATIVE    Comment: (NOTE) The Xpert Xpress SARS-CoV-2/FLU/RSV plus assay is intended as an aid in the diagnosis of influenza from Nasopharyngeal swab specimens and should not be used as a sole basis for treatment. Nasal washings and aspirates are unacceptable for Xpert Xpress SARS-CoV-2/FLU/RSV testing.  Fact Sheet for Patients: BloggerCourse.com  Fact Sheet for Healthcare Providers: SeriousBroker.it  This test is not yet approved or cleared by the [place] FDA and has been authorized for detection and/or diagnosis of SARS-CoV-2 by FDA under an Emergency Use Authorization (EUA). This EUA will remain in effect (meaning this test can be used) for the duration of the COVID-19 declaration under Section 564(b)(1) of the Act, 21 U.S.C. section 360bbb-3(b)(1), unless the authorization is terminated or revoked.     Resp Syncytial Virus by PCR NEGATIVE NEGATIVE    Comment: (NOTE) Fact Sheet for Patients: BloggerCourse.com  Fact Sheet for Healthcare Providers: SeriousBroker.it  This test is not yet approved or cleared by the [place] FDA and has been authorized for detection and/or diagnosis of SARS-CoV-2 by FDA under an Emergency Use Authorization (EUA). This EUA will remain in effect (meaning this test can be used) for the duration of the COVID-19 declaration under Section  564(b)(1) of the Act, 21 U.S.C. section 360bbb-3(b)(1), unless the authorization is terminated or revoked.  Performed at [facility], [address] [place], [place] [address]   CBG monitoring, ED     Status: Abnormal   Collection Time: 06/24/2022  8:27 PM  Result Value Ref Range   Glucose-Capillary 121 (H) 70 - 99 mg/dL    Comment: Glucose reference range applies only to samples taken after fasting for at least 8 hours.  I-Stat venous blood gas, ([facility] ED, [facility], [facility])     Status: Abnormal   Collection Time: 07/09/2022  8:56 PM  Result Value Ref Range   pH, Ven 7.380 7.25 - 7.43   pCO2, Ven 62.0 (H) 44 - 60 mmHg   pO2, Ven 111 (H) 32 - 45 mmHg   Bicarbonate 36.7 (H) 20.0 - 28.0 mmol/L   TCO2 39 (H) 22 - 32 mmol/L   O2 Saturation 98 %   Acid-Base Excess 9.0 (H) 0.0 - 2.0 mmol/L   Sodium 129 (L) 135 - 145 mmol/L   Potassium 4.0 3.5 - 5.1 mmol/L   Calcium, Ion 1.06 (L) 1.15 - 1.40 mmol/L   HCT 37.0 (L) 39.0 - 52.0 %   Hemoglobin 12.6 (L) 13.0 - 17.0 g/dL   Patient temperature 60.4 C    Sample type VENOUS    DG Chest Portable 1 View  Result Date: 06/30/2022 CLINICAL DATA:  Shortness of breath EXAM: PORTABLE CHEST 1 VIEW COMPARISON:  11/12/2021 FINDINGS: Cardiac shadow is stable. Defibrillator is again noted. Lungs are hyperinflated. Mild left basilar scarring is again noted. No focal infiltrate is seen. Mild chronic fibrotic changes are seen. No acute bony abnormality is noted. IMPRESSION: Stable scarring in the left base. No acute abnormality noted. Electronically Signed   By: Alcide Clever M.D.   On: 07/13/2022 20:46    Pending Labs Unresulted Labs (From admission, onward)     Start     Ordered   06/22/22 0500  Basic metabolic panel  Daily,   R      06/22/22 0057  06/22/22 0500  Magnesium  Tomorrow morning,   R        06/22/22 0057   06/22/22 0500  CBC  Daily,   R      06/22/22 0057   06/22/22 0057  Expectorated Sputum Assessment w Gram Stain, Rflx to Resp Cult  Once,   R         06/22/22 0057            Vitals/Pain Today's Vitals   06/22/22 0030 06/22/22 0100 06/22/22 0115 06/22/22 0128  BP: (!) 127/93 (!) 144/95    Pulse: (!) 134     Resp: (!) 24  (!) 26 (!) 34  Temp:      TempSrc:      SpO2: 100%  100% 100%  Weight:      Height:      PainSc:        Isolation Precautions No active isolations  Medications Medications  diltiazem (CARDIZEM) 125 mg in dextrose 5% 125 mL (1 mg/mL) infusion (10 mg/hr Intravenous Rate/Dose Change 06/22/22 0014)  metoprolol succinate (TOPROL-XL) 24 hr tablet 25 mg (has no administration in time range)  rosuvastatin (CRESTOR) tablet 5 mg (has no administration in time range)  pantoprazole (PROTONIX) EC tablet 40 mg (has no administration in time range)  apixaban (ELIQUIS) tablet 5 mg (5 mg Oral Given 06/22/22 0111)  doxycycline (VIBRA-TABS) tablet 100 mg (has no administration in time range)  methylPREDNISolone sodium succinate (SOLU-MEDROL) 125 mg/2 mL injection 125 mg (has no administration in time range)    Followed by  predniSONE (DELTASONE) tablet 40 mg (has no administration in time range)  ipratropium-albuterol (DUONEB) 0.5-2.5 (3) MG/3ML nebulizer solution 3 mL (3 mLs Nebulization Given 06/22/22 0112)  albuterol (PROVENTIL) (2.5 MG/3ML) 0.083% nebulizer solution 2.5 mg (has no administration in time range)  furosemide (LASIX) injection 20 mg (20 mg Intravenous Given 06/22/22 0112)  sodium chloride flush (NS) 0.9 % injection 3 mL (3 mLs Intravenous Given 06/22/22 0112)  acetaminophen (TYLENOL) tablet 650 mg (has no administration in time range)    Or  acetaminophen (TYLENOL) suppository 650 mg (has no administration in time range)  oxyCODONE (Oxy IR/ROXICODONE) immediate release tablet 5 mg (has no administration in time range)  polyethylene glycol (MIRALAX / GLYCOLAX) packet 17 g (has no administration in time range)  ipratropium-albuterol (DUONEB) 0.5-2.5 (3) MG/3ML nebulizer solution 3 mL (3 mLs Nebulization Given  06/22/2022 2044)  methylPREDNISolone sodium succinate (SOLU-MEDROL) 125 mg/2 mL injection 80 mg (80 mg Intravenous Given 07/13/2022 2131)  albuterol (PROVENTIL) (2.5 MG/3ML) 0.083% nebulizer solution 5 mg (5 mg Nebulization Given 07/14/2022 2130)  doxycycline (VIBRA-TABS) tablet 100 mg (100 mg Oral Given 07/05/2022 2310)  diltiazem (CARDIZEM) 1 mg/mL load via infusion 10 mg (10 mg Intravenous Bolus from Bag 06/22/22 0117)    Mobility walks with device     Focused Assessments     R Recommendations: See Admitting Provider Note  Report given to:   Additional Notes:

## 2022-06-22 NOTE — Progress Notes (Signed)
   06/22/22 0226  Assess: MEWS Score  Temp (!) 97.3 F (36.3 C)  BP 123/78  MAP (mmHg) 90  Pulse Rate (!) 50  ECG Heart Rate (!) 122  Resp (!) 29  Level of Consciousness Alert  SpO2 93 %  O2 Device Bi-PAP  Assess: MEWS Score  MEWS Temp 0  MEWS Systolic 0  MEWS Pulse 2  MEWS RR 2  MEWS LOC 0  MEWS Score 4  MEWS Score Color Red  Assess: if the MEWS score is Yellow or Red  Were vital signs taken at a resting state? Yes  Focused Assessment Change from prior assessment (see assessment flowsheet)  Does the patient meet 2 or more of the SIRS criteria? Yes  Does the patient have a confirmed or suspected source of infection? No  MEWS guidelines implemented  Yes, red  Treat  MEWS Interventions Considered administering scheduled or prn medications/treatments as ordered  Take Vital Signs  Increase Vital Sign Frequency  Red: Q1hr x2, continue Q4hrs until patient remains green for 12hrs  Escalate  MEWS: Escalate Red: Discuss with charge nurse and notify provider. Consider notifying RRT. If remains red for 2 hours consider need for higher level of care  Notify: Charge Nurse/RN  Name of Charge Nurse/RN Notified [place], RN  Notify: Rapid Response  Name of Rapid Response RN Notified Mindy, RN  Date Rapid Response Notified 06/22/22  Time Rapid Response Notified 0226  Assess: SIRS CRITERIA  SIRS Temperature  0  SIRS Pulse 1  SIRS Respirations  1  SIRS WBC 1  SIRS Score Sum  3

## 2022-06-22 NOTE — Plan of Care (Signed)
  Problem: Education: Goal: Ability to demonstrate management of disease process will improve Outcome: Progressing Goal: Ability to verbalize understanding of medication therapies will improve Outcome: Progressing Goal: Individualized Educational Video(s) Outcome: Progressing   Problem: Activity: Goal: Capacity to carry out activities will improve Outcome: Progressing   Problem: Cardiac: Goal: Ability to achieve and maintain adequate cardiopulmonary perfusion will improve Outcome: Progressing   Problem: Education: Goal: Knowledge of disease or condition will improve Outcome: Progressing Goal: Knowledge of the prescribed therapeutic regimen will improve Outcome: Progressing Goal: Individualized Educational Video(s) Outcome: Progressing   Problem: Activity: Goal: Ability to tolerate increased activity will improve Outcome: Progressing Goal: Will verbalize the importance of balancing activity with adequate rest periods Outcome: Progressing   Problem: Respiratory: Goal: Ability to maintain a clear airway will improve Outcome: Progressing Goal: Levels of oxygenation will improve Outcome: Progressing Goal: Ability to maintain adequate ventilation will improve Outcome: Progressing   Problem: Education: Goal: Knowledge of disease or condition will improve Outcome: Progressing Goal: Understanding of medication regimen will improve Outcome: Progressing Goal: Individualized Educational Video(s) Outcome: Progressing   Problem: Activity: Goal: Ability to tolerate increased activity will improve Outcome: Progressing   Problem: Cardiac: Goal: Ability to achieve and maintain adequate cardiopulmonary perfusion will improve Outcome: Progressing   Problem: Health Behavior/Discharge Planning: Goal: Ability to safely manage health-related needs after discharge will improve Outcome: Progressing   Problem: Education: Goal: Knowledge of General Education information will  improve Description: Including pain rating scale, medication(s)/side effects and non-pharmacologic comfort measures Outcome: Progressing   Problem: Health Behavior/Discharge Planning: Goal: Ability to manage health-related needs will improve Outcome: Progressing   Problem: Clinical Measurements: Goal: Ability to maintain clinical measurements within normal limits will improve Outcome: Progressing Goal: Will remain free from infection Outcome: Progressing Goal: Diagnostic test results will improve Outcome: Progressing Goal: Respiratory complications will improve Outcome: Progressing Goal: Cardiovascular complication will be avoided Outcome: Progressing   Problem: Activity: Goal: Risk for activity intolerance will decrease Outcome: Progressing   Problem: Nutrition: Goal: Adequate nutrition will be maintained Outcome: Progressing   Problem: Coping: Goal: Level of anxiety will decrease Outcome: Progressing   Problem: Elimination: Goal: Will not experience complications related to bowel motility Outcome: Progressing Goal: Will not experience complications related to urinary retention Outcome: Progressing   Problem: Pain Managment: Goal: General experience of comfort will improve Outcome: Progressing   Problem: Safety: Goal: Ability to remain free from injury will improve Outcome: Progressing   Problem: Skin Integrity: Goal: Risk for impaired skin integrity will decrease Outcome: Progressing   Problem: Education: Goal: Ability to describe self-care measures that may prevent or decrease complications (Diabetes Survival Skills Education) will improve Outcome: Progressing Goal: Individualized Educational Video(s) Outcome: Progressing   Problem: Coping: Goal: Ability to adjust to condition or change in health will improve Outcome: Progressing   Problem: Fluid Volume: Goal: Ability to maintain a balanced intake and output will improve Outcome: Progressing    Problem: Health Behavior/Discharge Planning: Goal: Ability to identify and utilize available resources and services will improve Outcome: Progressing Goal: Ability to manage health-related needs will improve Outcome: Progressing   Problem: Metabolic: Goal: Ability to maintain appropriate glucose levels will improve Outcome: Progressing   Problem: Nutritional: Goal: Maintenance of adequate nutrition will improve Outcome: Progressing Goal: Progress toward achieving an optimal weight will improve Outcome: Progressing   Problem: Skin Integrity: Goal: Risk for impaired skin integrity will decrease Outcome: Progressing   Problem: Tissue Perfusion: Goal: Adequacy of tissue perfusion will improve Outcome: Progressing   

## 2022-06-22 NOTE — Assessment & Plan Note (Addendum)
Patient developed bradycardia, and diltiazem drip has been discontinued.  Telemetry with persistent atrial fibrillation, he had episodic irregularly irregular wide complex tachycardia, likely a fib with aberrancy.  Plan to continue oral amiodarone, and continue telemetry monitoring. If recurrent RVR will transition to amiodarone drip.

## 2022-06-22 NOTE — Hospital Course (Addendum)
Mr. Brett Saunders was admitted to the hospital with the working diagnosis of COPD exacerbation.   77 yo male with the past medical history of COPD, chronic hypoxemic respiratory failure, heart failure, atrial fibrillation and peripheral vascular disease who presented with dyspnea. Reported a rapid worsening of dyspnea over last 12 hrs, that prompted him to come to the ED, on his initial physical examination his RR was 36, HR 147, blood pressure 108/78, and 02 saturation 98% on 8 L/min per [place], lungs with increased work of breathing, prolonged expiratory phase and bilateral scattered rhonchi, heart with S1 and S2 present, tachycardic, irregularly irregular, abdomen with no distention, with no lower extremity edema.   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   EKG 135 bpm, normal axis, qtc 522, interventricular conduction delay, atrial fibrillation with no significant ST segment or T wave changes, positive LVH.   Patient was placed on Bipap, steroids and diuretics.  Diltiazem drip for rate control.   04/06 trial off Bipap.

## 2022-06-22 NOTE — ED Provider Notes (Signed)
[place] [facility]MENT AT [facility] Provider Note   CSN: [id number] Arrival date & time: 07/18/2022  2000     History  Chief Complaint  Patient presents with   Shortness of Breath    Brett Saunders. is a 77 y.o. male with PMH of fibrillation, COPD, PAD presenting with shortness of breath and heart palpitations that began earlier this evening.  Patient wears 4 L nasal cannula at baseline and was having shortness of breath despite this.  He has had worsening cough with increased sputum production over the past few days.  He denies fever, chest pain, nausea, vomiting, abdominal pain, headache.   Shortness of Breath      Home Medications Prior to Admission medications   Medication Sig Start Date End Date Taking? Authorizing Provider  acetaminophen (TYLENOL) 500 MG tablet Take 500 mg by mouth at bedtime.    [provider]  albuterol (PROVENTIL) (2.5 MG/3ML) 0.083% nebulizer solution Take 2.5 mg by nebulization daily.    [provider]  albuterol (VENTOLIN HFA) 108 (90 Base) MCG/ACT inhaler Inhale 2 puffs into the lungs every 4 (four) hours as needed for wheezing or shortness of breath.    [provider]  amiodarone (PACERONE) 200 MG tablet Take 2 tablets (400mg ) twice daily x 2 weeks, further dosing pending [facility]. 06/06/22   Duke Salvia, MD  apixaban (ELIQUIS) 5 MG TABS tablet Take 2.5 mg by mouth 2 (two) times daily.    [provider]  furosemide (LASIX) 20 MG tablet Take 20 mg by mouth daily.     [provider]  Methyl Salicylate-Lido-Menthol 4-4-5 % PTCH Apply 1 patch topically in the morning and at bedtime. 11/12/21   Gerhard Munch, MD  metoprolol succinate (TOPROL XL) 25 MG 24 hr tablet Take 1 tablet (25 mg total) by mouth daily. 06/04/22   Duke Salvia, MD  mometasone (ASMANEX, 60 METERED DOSES,) 220 MCG/ACT inhaler Inhale 2 puffs into the lungs at bedtime.    [provider]  Omega-3 Fatty  Acids (FISH OIL PO) Take 1 capsule by mouth daily.    [provider]  omeprazole (PRILOSEC) 20 MG capsule Take 20 mg by mouth daily as needed (Heartburn).    [provider]  OXYGEN Inhale 3.5 L/min into the lungs at bedtime. Portable during the day    [provider]  rOPINIRole (REQUIP) 0.25 MG tablet Take 0.25 mg by mouth 2 (two) times daily.    [provider]  rosuvastatin (CRESTOR) 5 MG tablet Take 5 mg by mouth daily.    [provider]  sodium chloride 1 g tablet Take 1 g by mouth 3 (three) times daily.    [provider]  Tiotropium Bromide-Olodaterol 2.5-2.5 MCG/ACT AERS Inhale 2 each into the lungs daily. 12/06/21   [provider]  triamcinolone ointment (KENALOG) 0.1 % Apply 1 Application topically 2 (two) times daily. 03/29/22   [provider]      Allergies    Amoxicillin and Avelox [moxifloxacin hcl]    Review of Systems   Review of Systems  Respiratory:  Positive for shortness of breath.     Physical Exam Updated Vital Signs BP 123/74 (BP Location: Right Arm)   Pulse (!) 119   Temp 97.6 F (36.4 C) (Oral)   Resp 20   Ht 5\' 8"  (1.727 m)   Wt 47.6 kg   SpO2 96%   BMI 15.97 kg/m  Physical Exam Vitals and  nursing note reviewed.  Constitutional:      General: He is not in acute distress.    Appearance: He is well-developed.  HENT:     Head: Normocephalic and atraumatic.     Nose: Nose normal.     Mouth/Throat:     Mouth: Mucous membranes are moist.     Pharynx: No oropharyngeal exudate.  Eyes:     Extraocular Movements: Extraocular movements intact.     Conjunctiva/sclera: Conjunctivae normal.     Pupils: Pupils are equal, round, and reactive to light.  Cardiovascular:     Rate and Rhythm: Tachycardia present. Rhythm irregular.     Heart sounds: No murmur heard. Pulmonary:     Effort: Tachypnea present. No respiratory distress.     Breath sounds: Examination of the right-upper field  reveals decreased breath sounds and wheezing. Examination of the left-upper field reveals decreased breath sounds and wheezing. Examination of the right-middle field reveals decreased breath sounds. Examination of the left-middle field reveals decreased breath sounds. Examination of the right-lower field reveals decreased breath sounds. Examination of the left-lower field reveals decreased breath sounds. Decreased breath sounds, wheezing and rales present.     Comments: Decreased breath sounds throughout Chest:     Chest wall: No tenderness.  Abdominal:     General: Abdomen is flat. There is no distension.     Palpations: Abdomen is soft.     Tenderness: There is no abdominal tenderness.  Musculoskeletal:        General: No swelling.     Cervical back: Normal range of motion and neck supple.     Right lower leg: No tenderness. No edema.     Left lower leg: No tenderness. No edema.  Skin:    General: Skin is warm and dry.     Capillary Refill: Capillary refill takes less than 2 seconds.     Findings: No rash.  Neurological:     Mental Status: He is alert and oriented to person, place, and time.  Psychiatric:        Mood and Affect: Mood normal.     ED Results / Procedures / Treatments   Labs (all labs ordered are listed, but only abnormal results are displayed) Labs Reviewed  CBC WITH DIFFERENTIAL/PLATELET - Abnormal; Notable for the following components:      Result Value   WBC 13.7 (*)    RBC 3.72 (*)    Hemoglobin 11.2 (*)    HCT 35.6 (*)    Neutro Abs 12.1 (*)    Lymphs Abs 0.5 (*)    All other components within normal limits  COMPREHENSIVE METABOLIC PANEL - Abnormal; Notable for the following components:   Sodium 130 (*)    Chloride 87 (*)    CO2 33 (*)    Glucose, Bld 119 (*)    Calcium 8.8 (*)    Albumin 3.0 (*)    All other components within normal limits  BRAIN NATRIURETIC PEPTIDE - Abnormal; Notable for the following components:   B Natriuretic Peptide 808.2 (*)     All other components within normal limits  I-STAT VENOUS BLOOD GAS, ED - Abnormal; Notable for the following components:   pCO2, Ven 62.0 (*)    pO2, Ven 111 (*)    Bicarbonate 36.7 (*)    TCO2 39 (*)    Acid-Base Excess 9.0 (*)    Sodium 129 (*)    Calcium, Ion 1.06 (*)    HCT 37.0 (*)  Hemoglobin 12.6 (*)    All other components within normal limits  CBG MONITORING, ED - Abnormal; Notable for the following components:   Glucose-Capillary 121 (*)    All other components within normal limits  RESP PANEL BY RT-PCR (RSV, FLU A&B, COVID)  RVPGX2  EXPECTORATED SPUTUM ASSESSMENT W GRAM STAIN, RFLX TO RESP C  BASIC METABOLIC PANEL  MAGNESIUM  CBC    EKG EKG Interpretation  Date/Time:  Friday June 21 2022 20:11:33 EDT Ventricular Rate:  135 PR Interval:    QRS Duration: 127 QT Interval:  359 QTC Calculation: 522 R Axis:   -65 Text Interpretation: Atrial fibrillation with rapid ventricular response Nonspecific IVCD with LAD Left ventricular hypertrophy Non-specific ST-t changes Confirmed by Cathren LaineSteinl, Kevin ([id number]) on 07/07/2022 9:13:13 PM  Radiology DG Chest Portable 1 View  Result Date: 06/20/2022 CLINICAL DATA:  Shortness of breath EXAM: PORTABLE CHEST 1 VIEW COMPARISON:  11/12/2021 FINDINGS: Cardiac shadow is stable. Defibrillator is again noted. Lungs are hyperinflated. Mild left basilar scarring is again noted. No focal infiltrate is seen. Mild chronic fibrotic changes are seen. No acute bony abnormality is noted. IMPRESSION: Stable scarring in the left base. No acute abnormality noted. Electronically Signed   By: Alcide CleverMark  Lukens M.D.   On: 06/18/2022 20:46    Medications Ordered in ED Medications  diltiazem (CARDIZEM) 125 mg in dextrose 5% 125 mL (1 mg/mL) infusion (10 mg/hr Intravenous Rate/Dose Change 06/22/22 0014)  metoprolol succinate (TOPROL-XL) 24 hr tablet 25 mg (has no administration in time range)  rosuvastatin (CRESTOR) tablet 5 mg (has no administration in time  range)  pantoprazole (PROTONIX) EC tablet 40 mg (has no administration in time range)  apixaban (ELIQUIS) tablet 5 mg (5 mg Oral Given 06/22/22 0111)  doxycycline (VIBRA-TABS) tablet 100 mg (has no administration in time range)  methylPREDNISolone sodium succinate (SOLU-MEDROL) 125 mg/2 mL injection 125 mg (has no administration in time range)    Followed by  predniSONE (DELTASONE) tablet 40 mg (has no administration in time range)  ipratropium-albuterol (DUONEB) 0.5-2.5 (3) MG/3ML nebulizer solution 3 mL (3 mLs Nebulization Given 06/22/22 0112)  albuterol (PROVENTIL) (2.5 MG/3ML) 0.083% nebulizer solution 2.5 mg (2.5 mg Nebulization Given 06/22/22 0130)  furosemide (LASIX) injection 20 mg (20 mg Intravenous Given 06/22/22 0112)  sodium chloride flush (NS) 0.9 % injection 3 mL (3 mLs Intravenous Given 06/22/22 0112)  acetaminophen (TYLENOL) tablet 650 mg (has no administration in time range)    Or  acetaminophen (TYLENOL) suppository 650 mg (has no administration in time range)  oxyCODONE (Oxy IR/ROXICODONE) immediate release tablet 5 mg (has no administration in time range)  polyethylene glycol (MIRALAX / GLYCOLAX) packet 17 g (has no administration in time range)  ipratropium-albuterol (DUONEB) 0.5-2.5 (3) MG/3ML nebulizer solution 3 mL (3 mLs Nebulization Given 07/03/2022 2044)  methylPREDNISolone sodium succinate (SOLU-MEDROL) 125 mg/2 mL injection 80 mg (80 mg Intravenous Given 07/13/2022 2131)  albuterol (PROVENTIL) (2.5 MG/3ML) 0.083% nebulizer solution 5 mg (5 mg Nebulization Given 06/25/2022 2130)  doxycycline (VIBRA-TABS) tablet 100 mg (100 mg Oral Given 07/10/2022 2310)  diltiazem (CARDIZEM) 1 mg/mL load via infusion 10 mg (10 mg Intravenous Bolus from Bag 06/22/22 0117)    ED Course/ Medical Decision Making/ A&P                            Medical Decision Making Amount and/or Complexity of Data Reviewed Labs: ordered. Radiology: ordered.  Risk Prescription drug management. Decision regarding  hospitalization.   Patient presented hemodynamically stable, but tachycardic and tachypneic he was saturating in the 90s on 6 L initially.  On exam he has decreased lung sounds throughout with wheezing in bilateral apices.  There is no abdominal tenderness  Labs significant for leukocytosis, no notable electrolyte abnormalities or acute anemia.  Patient is hypercarbic, but not acidotic.  He is COVID and influenza negative.  His BNP is elevated at 808, which is up from previous. Chest x-ray shows no signs of focal consolidation or acute changes only chronic scarring noted. EKG notable for atrial fibrillation with RVR, no signs of acute ischemia at this time.  Patient was given DuoNeb, with improvement of his lung sounds and wheezing.  He was then saturating in the 90s on his baseline 4 L.  He was also given Solu-Medrol and doxycycline as he is having increased sputum production despite being afebrile and having no signs of pneumonia.  This tachycardia persisted in atrial fibrillation, was given Cardizem drip.  Patient is admitted to hospital for further care and observation.         Final Clinical Impression(s) / ED Diagnoses Final diagnoses:  COPD exacerbation  Shortness of breath  Atrial fibrillation with rapid ventricular response     Maryruth Eve, MD 06/22/22 5027    Cathren Laine, MD 06/22/22 1646

## 2022-06-22 NOTE — Progress Notes (Signed)
Progress Note   Patient: Brett Saunders. TMH:[id number] DOB: Nov 27, 1945 DOA: 07/09/2022     0 DOS: the patient was seen and examined on 06/22/2022   Brief hospital course: Mr. Manuele was admitted to the hospital with the working diagnosis of COPD exacerbation.   77 yo male with the past medical history of COPD, chronic hypoxemic respiratory failure, heart failure, atrial fibrillation and peripheral vascular disease who presented with dyspnea. Reported a rapid worsening of dyspnea over last 12 hrs, that prompted him to come to the ED, on his initial physical examination his RR was 36, HR 147, blood pressure 108/78, and 02 saturation 98% on 8 L/min per [place], lungs with increased work of breathing, prolonged expiratory phase and bilateral scattered rhonchi, heart with S1 and S2 present, tachycardic, irregularly irregular, abdomen with no distention, with no lower extremity edema.   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   EKG 135 bpm, normal axis, qtc 522, interventricular conduction delay, atrial fibrillation with no significant ST segment or T wave changes, positive LVH.   Patient was placed on Bipap, steroids and diuretics.  Diltiazem drip for rate control.   04/06 trial off Bipap.   Assessment and Plan: * COPD exacerbation Patient has been on Bipap this am, he is awake and alert and asking to remove full face mask.  His oxygenation has been up to 100%  Chest film with no infiltrates.   Plan to trail nasal cannula, keep 02 saturation 88% or greater.  Considering his chest radiograph hyperinflation, he has significant emphysema.  Continue systemic steroids with methylprednisolone.  Bronchodilator therapy with duoneb scheduled and as needed. Inhaled corticosteroids and airway clearing techniques.  Check abg this pm.   Atrial fibrillation with  RVR Continue diltiazem infusion for now, to keep rate control. Resume amiodarone per home regimen.  Continue telemetry monitoring. Anticoagulation with apixaban.   Acute on chronic diastolic CHF (congestive heart failure) This morning with no signs of volume overload.  Plan to hold on furosemide for now. Last echocardiogram from 2020 with preserved LV systolic function.  Plan to repeat echocardiogram when atrial fibrillation better controlled.    PAD (peripheral artery disease) Continue blood pressure monitoring.   Hyponatremia Will resume home sodium tablets. Continue follow up renal function and electrolytes.         Subjective: Patient is asking to be off bipap, mask is pressing on his face and inflicting pain on his nose.  I was present at the time bipap was transition to nasal cannula, and he is tolerating it well. He is requesting to be back on amiodarone for his atrial fibrillation. He is hungry.  He is Ok with ventilator if needed.   Physical Exam: Vitals:   06/22/22 0743 06/22/22 0903 06/22/22 1131 06/22/22 1148  BP: 102/71 110/64  (!) 84/67  Pulse: 98 96  91  Resp: (!) 22 (!) 21  (!) 30  Temp: 97.8 F (36.6 C)   97.7 F (36.5 C)  TempSrc: Axillary   Axillary  SpO2: 99% 100% 95%   Weight:      Height:       Neurology awake and alert ENT with mild pallor Cardiovascular with S1 and S2 present, irregularly irregular with no gallops, rubs or murmurs Respiratory with distant breath sounds and prolonged expiratory phase with no wheezing, scattered rhonchi with no rales Abdomen with no distention  No lower extremity edema  Data Reviewed:    Family Communication: no family at the bedside   Disposition: Status is: Inpatient Remains inpatient appropriate because: respiratory failure   Planned Discharge Destination: Home      Author: Coralie Keens, MD 06/22/2022 12:29 PM  For on call review [email or web address].

## 2022-06-22 NOTE — IPAL (Signed)
I spoke with patient about his condition, high oxygen requirements, COPD and decrease respiratory reserve. He would like to avoid bipap due facial pain while using facial mask. We talked about other treatment options including invasive mechanical ventilation for worsening respiratory failure or CPR in case of cardiac arrest. Considering his current condition he has decided to change his code status to DNR, in case of worsening respiratory failure or cardiac arrest will concentrate in avoid suffering. I have called his wife and made her aware of Brett Saunders decision.

## 2022-06-22 NOTE — Assessment & Plan Note (Signed)
Patient has been on Bipap this am, he is awake and alert and asking to remove full face mask.  His oxygenation has been up to 100%  Chest film with no infiltrates.   Plan to trail nasal cannula, keep 02 saturation 88% or greater.  Considering his chest radiograph hyperinflation, he has significant emphysema.  Continue systemic steroids with methylprednisolone.  Bronchodilator therapy with duoneb scheduled and as needed. Inhaled corticosteroids and airway clearing techniques.  Check abg this pm.

## 2022-06-22 NOTE — H&P (Signed)
History and Physical    [employer]erald Fuertes Jr. AVW:[id number] DOB: February 29, 1946 DOA: 06/22/2022  PCP: Theodis Shoveombs, Allison Brooke, DO   Patient coming from: Home   Chief Complaint: SOB   HPI: Brett FellerGerald Shealy Jr. is a 77 y.o. male with medical history significant for COPD, chronic hypoxic respiratory failure, chronic diastolic CHF, atrial fibrillation on Eliquis, and peripheral arterial disease who presents emergency department with shortness of breath.  Patient reports increase in his chronic dyspnea beginning this afternoon and progressively worsening.  He has had increased cough for the past couple days and feels as though he needs to expectorate but has been unable to.  He denies chest pain, leg swelling, fever, or chills.  Patient notes his spouse has encouraged him to significantly increase his water intake recently.  ED Course: Upon arrival to the ED, patient is found to be afebrile and saturating 100% on 8 L/min of supplemental oxygen with tachypnea, tachycardia, and systolic blood pressure of 108 and greater.  EKG demonstrates atrial fibrillation with rate 135.  Chest x-ray is negative for acute findings.  Labs are notable for sodium 130, bicarbonate 33, WBC 13,700, and BNP 808.  Patient was treated in the emergency department with albuterol, DuoNeb, doxycycline, and IV diltiazem infusion.  Review of Systems:  All other systems reviewed and apart from HPI, are negative.  Past Medical History:  Diagnosis Date   Aortic anomaly    aortic calcification   Arrhythmia    a fib   Atrial fibrillation    CAD (coronary artery disease)    CHF (congestive heart failure)    Chronic obstructive lung disease    COPD (chronic obstructive pulmonary disease)    Diverticulosis 2010   colonoscopy 08-2008   DVT (deep venous thrombosis)    GERD (gastroesophageal reflux disease)    Hemorrhoids    History of atrial fibrillation    Hypertension    Ischemic cardiomyopathy    Lung cancer    lung   On home  oxygen therapy    Peripheral vascular disease    Stroke    oct 2012    Past Surgical History:  Procedure Laterality Date   ABDOMINAL AORTOGRAM W/LOWER EXTREMITY N/A 09/13/2021   Procedure: ABDOMINAL AORTOGRAM W/LOWER EXTREMITY;  Surgeon: Cephus Shellinglark, Christopher J, MD;  Location: [facility];  Service: Cardiovascular;  Laterality: N/A;   CORONARY ANGIOGRAM  2013   [facility] [place]   ENDARTERECTOMY Left 10/01/2021   Procedure: LEFT ILIOFEMORAL ENDARTERECTOMY WITH  1 cm X 6 cm BOVINE PATCH;  Surgeon: Cephus Shellinglark, Christopher J, MD;  [facility];  Service: Vascular;  Laterality: Left;   ICD GENERATOR CHANGEOUT N/A 05/15/2022   Procedure: ICD GENERATOR CHANGEOUT;  Surgeon: Maurice SmallMealor, Augustus E, MD;  [facility];  Service: Cardiovascular;  Laterality: N/A;   PACEMAKER PLACEMENT     and defibrillator    Social History:   reports that he has been smoking cigarettes. He has been smoking an average of .1 packs per day. He has never used smokeless tobacco. He reports current alcohol use. He reports that he does not use drugs.  Allergies  Allergen Reactions   Amoxicillin Shortness Of Breath    Has patient had a PCN reaction causing immediate rash, facial/tongue/throat swelling, SOB or lightheadedness with hypotension: Yes Has patient had a PCN reaction causing severe rash involving mucus membranes or skin necrosis: Yes Has patient had a PCN reaction that required hospitalization: Yes Has patient had a PCN reaction occurring  within the last 10 years: No If all of the above answers are "NO", then may proceed with Cephalosporin use.   Avelox [Moxifloxacin Hcl] Other (See Comments)    confusion    History reviewed. No pertinent family history.   Prior to Admission medications   Medication Sig Start Date End Date Taking? Authorizing Provider  acetaminophen (TYLENOL) 500 MG tablet Take 500 mg by mouth at bedtime.    [provider]   albuterol (PROVENTIL) (2.5 MG/3ML) 0.083% nebulizer solution Take 2.5 mg by nebulization daily.    [provider]  albuterol (VENTOLIN HFA) 108 (90 Base) MCG/ACT inhaler Inhale 2 puffs into the lungs every 4 (four) hours as needed for wheezing or shortness of breath.    [provider]  amiodarone (PACERONE) 200 MG tablet Take 2 tablets (400mg ) twice daily x 2 weeks, further dosing pending [facility]. 06/06/22   Duke Salvia, MD  apixaban (ELIQUIS) 5 MG TABS tablet Take 2.5 mg by mouth 2 (two) times daily.    [provider]  furosemide (LASIX) 20 MG tablet Take 20 mg by mouth daily.     [provider]  Methyl Salicylate-Lido-Menthol 4-4-5 % PTCH Apply 1 patch topically in the morning and at bedtime. 11/12/21   Gerhard Munch, MD  metoprolol succinate (TOPROL XL) 25 MG 24 hr tablet Take 1 tablet (25 mg total) by mouth daily. 06/04/22   Duke Salvia, MD  mometasone (ASMANEX, 60 METERED DOSES,) 220 MCG/ACT inhaler Inhale 2 puffs into the lungs at bedtime.    [provider]  Omega-3 Fatty Acids (FISH OIL PO) Take 1 capsule by mouth daily.    [provider]  omeprazole (PRILOSEC) 20 MG capsule Take 20 mg by mouth daily as needed (Heartburn).    [provider]  OXYGEN Inhale 3.5 L/min into the lungs at bedtime. Portable during the day    [provider]  rOPINIRole (REQUIP) 0.25 MG tablet Take 0.25 mg by mouth 2 (two) times daily.    [provider]  rosuvastatin (CRESTOR) 5 MG tablet Take 5 mg by mouth daily.    [provider]  sodium chloride 1 g tablet Take 1 g by mouth 3 (three) times daily.    [provider]  Tiotropium Bromide-Olodaterol 2.5-2.5 MCG/ACT AERS Inhale 2 each into the lungs daily. 12/06/21   [provider]  triamcinolone ointment (KENALOG) 0.1 % Apply 1 Application topically 2 (two) times daily. 03/29/22   [provider]    Physical Exam: Vitals:   06/20/2022  2145 07/03/2022 2230 06/27/2022 2330 06/22/22 0000  BP:  108/78 121/77 132/79  Pulse:  (!) 142 (!) 136 (!) 147  Resp: (!) 25 (!) 24 (!) 24 (!) 36  Temp:    97.6 F (36.4 C)  TempSrc:    Oral  SpO2: 100% 100% 100% 98%  Weight:      Height:        Constitutional: NAD, no diaphoresis   Eyes: PERTLA, lids and conjunctivae normal ENMT: Mucous membranes are moist. Posterior pharynx clear of any exudate or lesions.   Neck: supple, no masses  Respiratory: Labored respirations but speaking full sentences. Prolonged expiratory phase, scattered rhonchi.   Cardiovascular: Rate ~120 and irregularly irregular. No extremity edema.  Abdomen: No distension, no tenderness, soft. Bowel sounds active.  Musculoskeletal: no clubbing / cyanosis. No joint deformity upper and lower extremities.   Skin: no significant rashes, lesions, ulcers. Warm, dry, well-perfused. Neurologic: CN 2-12 grossly  intact. Moving all extremities. Alert and oriented.  Psychiatric: Calm. Cooperative.    Labs and Imaging on Admission: I have personally reviewed following labs and imaging studies  CBC: Recent Labs  Lab 07-13-2022 2022 07-13-22 2056  WBC 13.7*  --   NEUTROABS 12.1*  --   HGB 11.2* 12.6*  HCT 35.6* 37.0*  MCV 95.7  --   PLT 244  --    Basic Metabolic Panel: Recent Labs  Lab 2022/07/13 2022 07/13/22 2056  NA 130* 129*  K 4.0 4.0  CL 87*  --   CO2 33*  --   GLUCOSE 119*  --   BUN 8  --   CREATININE 0.91  --   CALCIUM 8.8*  --    GFR: Estimated Creatinine Clearance: 45 mL/min (by C-G formula based on SCr of 0.91 mg/dL). Liver Function Tests: Recent Labs  Lab 2022/07/13 2022  AST 22  ALT 14  ALKPHOS 85  BILITOT 0.4  PROT 6.7  ALBUMIN 3.0*   No results for input(s): "LIPASE", "AMYLASE" in the last 168 hours. No results for input(s): "AMMONIA" in the last 168 hours. Coagulation Profile: No results for input(s): "INR", "PROTIME" in the last 168 hours. Cardiac Enzymes: No results for input(s):  "CKTOTAL", "CKMB", "CKMBINDEX", "TROPONINI" in the last 168 hours. BNP (last 3 results) No results for input(s): "PROBNP" in the last 8760 hours. HbA1C: No results for input(s): "HGBA1C" in the last 72 hours. CBG: Recent Labs  Lab 07-13-2022 2027  GLUCAP 121*   Lipid Profile: No results for input(s): "CHOL", "HDL", "LDLCALC", "TRIG", "CHOLHDL", "LDLDIRECT" in the last 72 hours. Thyroid Function Tests: No results for input(s): "TSH", "T4TOTAL", "FREET4", "T3FREE", "THYROIDAB" in the last 72 hours. Anemia Panel: No results for input(s): "VITAMINB12", "FOLATE", "FERRITIN", "TIBC", "IRON", "RETICCTPCT" in the last 72 hours. Urine analysis:    Component Value Date/Time   COLORURINE YELLOW 09/25/2021 1039   APPEARANCEUR CLEAR 09/25/2021 1039   LABSPEC 1.008 09/25/2021 1039   PHURINE 8.0 09/25/2021 1039   GLUCOSEU NEGATIVE 09/25/2021 1039   HGBUR NEGATIVE 09/25/2021 1039   BILIRUBINUR NEGATIVE 09/25/2021 1039   KETONESUR NEGATIVE 09/25/2021 1039   PROTEINUR NEGATIVE 09/25/2021 1039   NITRITE NEGATIVE 09/25/2021 1039   LEUKOCYTESUR NEGATIVE 09/25/2021 1039   Sepsis Labs: (procalcitonin:4,lacticidven:4) ) Recent Results (from the past 240 hour(s))  Resp panel by RT-PCR (RSV, Flu A&B, Covid) Anterior Nasal Swab     Status: None   Collection Time: 2022-07-13  8:25 PM   Specimen: Anterior Nasal Swab  Result Value Ref Range Status   SARS Coronavirus 2 by RT PCR NEGATIVE NEGATIVE Final   Influenza A by PCR NEGATIVE NEGATIVE Final   Influenza B by PCR NEGATIVE NEGATIVE Final    Comment: (NOTE) The Xpert Xpress SARS-CoV-2/FLU/RSV plus assay is intended as an aid in the diagnosis of influenza from Nasopharyngeal swab specimens and should not be used as a sole basis for treatment. Nasal washings and aspirates are unacceptable for Xpert Xpress SARS-CoV-2/FLU/RSV testing.  Fact Sheet for Patients: BloggerCourse.com  Fact Sheet for Healthcare  Providers: SeriousBroker.it  This test is not yet approved or cleared by the [place] FDA and has been authorized for detection and/or diagnosis of SARS-CoV-2 by FDA under an Emergency Use Authorization (EUA). This EUA will remain in effect (meaning this test can be used) for the duration of the COVID-19 declaration under Section 564(b)(1) of the Act, 21 U.S.C. section 360bbb-3(b)(1), unless the authorization is terminated or revoked.     Resp Syncytial  Virus by PCR NEGATIVE NEGATIVE Final    Comment: (NOTE) Fact Sheet for Patients: BloggerCourse.com  Fact Sheet for Healthcare Providers: SeriousBroker.it  This test is not yet approved or cleared by the [place] FDA and has been authorized for detection and/or diagnosis of SARS-CoV-2 by FDA under an Emergency Use Authorization (EUA). This EUA will remain in effect (meaning this test can be used) for the duration of the COVID-19 declaration under Section 564(b)(1) of the Act, 21 U.S.C. section 360bbb-3(b)(1), unless the authorization is terminated or revoked.  Performed at [facility], [address] [place], [place] [address]      Radiological Exams on Admission: DG Chest Portable 1 View  Result Date: 07/08/2022 CLINICAL DATA:  Shortness of breath EXAM: PORTABLE CHEST 1 VIEW COMPARISON:  11/12/2021 FINDINGS: Cardiac shadow is stable. Defibrillator is again noted. Lungs are hyperinflated. Mild left basilar scarring is again noted. No focal infiltrate is seen. Mild chronic fibrotic changes are seen. No acute bony abnormality is noted. IMPRESSION: Stable scarring in the left base. No acute abnormality noted. Electronically Signed   By: Alcide Clever M.D.   On: 07/06/2022 20:46    EKG: Independently reviewed. Atrial fibrillation with RVR, rate 135, non-specific IVCD with LAD.   Assessment/Plan   1. COPD exacerbation; acute on chronic hypoxic  respiratory failure  - Treated in ED with systemic steroids, antibiotic, and multiple nebs  - Culture sputum, continue systemic steroid and antibiotic, schedule DuoNebs, use additional albuterol as needed, continue supplemental O2, BiPAP if needed   2. Acute on chronic diastolic CHF  - Diurese with IV Lasix, monitor weight and I/Os, monitor renal function and electrolytes   3. Atrial fibrillation with RVR  - Started on IV diltiazem infusion in ED  - Continue Toprol and Eliquis, continue diltiazem infusion for now, check mag level and repeat EKG in am prior to resuming amiodarone in light of prolonged QT    4. Hyponatremia  - Serum sodium 130 in setting of hypervolemia and recent increased free water intake  - Restrict free water, follow serial chem panels while diuresing    5. PAD  - No acute ischemia  - Continue statin and Eliquis     DVT prophylaxis: Eliquis  Code Status: Full  Level of Care: Level of care: Progressive Family Communication: none present  Disposition Plan:  Patient is from: Home  Anticipated d/c is to: TBD Anticipated d/c date is: 06/25/22  Patient currently: Pending improved/stable respiratory status, stable HR  Consults called: none  Admission status: Inpatient     Briscoe Deutscher, MD [facility]  06/22/2022, 12:57 AM

## 2022-06-22 NOTE — Progress Notes (Signed)
Pt stated that he had recent surgery on his nose, unable to tolerate Bi-pap. Bi-pap removed & pt placed on 15 HFNC. O2 sats 93%. Pt stated, "I can't do this anymore". Attempted to notify Opyd, MD.  Bari Edward, RN

## 2022-06-22 NOTE — Assessment & Plan Note (Signed)
Will resume home sodium tablets. Continue follow up renal function and electrolytes.

## 2022-06-22 NOTE — Progress Notes (Signed)
Rt called pt arrived in respiratory distress placed pt on bipap abg collected

## 2022-06-22 NOTE — Assessment & Plan Note (Signed)
Continue blood pressure monitoring.  

## 2022-06-22 NOTE — ED Notes (Addendum)
Pt complaining of increased SOB. Pt is anxious and tachypnic. MD Opyd notified. RT called. Administered nebs per MAR.

## 2022-06-22 NOTE — Progress Notes (Signed)
   06/22/22 0520  Therapy Vitals  Pulse Rate 98  Resp 15  BP 96/64  MEWS Score/Color  MEWS Score 1  MEWS Score Color Green  Oxygen Therapy/Pulse Ox  O2 Therapy Oxygen humidified  O2 Flow Rate (L/min) 15 L/min  SpO2 100 %   Rn removed bipap pt. Wont leave it on at this time complaining of pain and a sore throat from it .

## 2022-06-22 NOTE — Significant Event (Signed)
Rapid Response Event Note   Reason for Call :  Unresponsiveness  Per ED RN, pt was alert and talking on elevator en route to 6E. Once in room, pt had episode of unresponsiveness. Pt was placed on bipap on arrival to unit. He began to respond once bipap on.   Initial Focused Assessment:  Pt lying in bed with eyes open. Pt not blinking to threat, speaking, or following commands. Pt arms moving against gravity to try to pull off bipap mask. After a few minutes, pt began to respond more and, eventually, he began to talk and follow commands. Lungs crackly t/o. Skin cool, dry.   HR-122, BP-123/78, RR-30, SpO2-93% on 100% bipap.  Interventions:  CBG-258 ABG-7.25/82/221/36 Follow-up ABG at 0730 Plan of Care:  Unclear cause of unresponsiveness. Pt currently back to baseline. ABG worse-bipap initiated. Monitor pt closely on bipap. Repeat ABG at 0730.  Call RRT if further assistance needed.   Event Summary:   MD Notified: Dr. Antionette Char Call [phone] Arrival [phone] End Time:0310  Terrilyn Saver, RN

## 2022-06-23 ENCOUNTER — Inpatient Hospital Stay (HOSPITAL_COMMUNITY): Payer: Medicare HMO

## 2022-06-23 DIAGNOSIS — I5033 Acute on chronic diastolic (congestive) heart failure: Secondary | ICD-10-CM | POA: Diagnosis not present

## 2022-06-23 DIAGNOSIS — J189 Pneumonia, unspecified organism: Secondary | ICD-10-CM

## 2022-06-23 DIAGNOSIS — I739 Peripheral vascular disease, unspecified: Secondary | ICD-10-CM | POA: Diagnosis not present

## 2022-06-23 DIAGNOSIS — Z515 Encounter for palliative care: Secondary | ICD-10-CM

## 2022-06-23 DIAGNOSIS — N179 Acute kidney failure, unspecified: Secondary | ICD-10-CM

## 2022-06-23 DIAGNOSIS — I4891 Unspecified atrial fibrillation: Secondary | ICD-10-CM | POA: Diagnosis not present

## 2022-06-23 LAB — BASIC METABOLIC PANEL
Anion gap: 12 (ref 5–15)
BUN: 22 mg/dL (ref 8–23)
CO2: 33 mmol/L — ABNORMAL HIGH (ref 22–32)
Calcium: 8.9 mg/dL (ref 8.9–10.3)
Chloride: 87 mmol/L — ABNORMAL LOW (ref 98–111)
Creatinine, Ser: 1.73 mg/dL — ABNORMAL HIGH (ref 0.61–1.24)
GFR, Estimated: 40 mL/min — ABNORMAL LOW (ref >60)
Glucose, Bld: 112 mg/dL — ABNORMAL HIGH (ref 70–99)
Potassium: 5.4 mmol/L — ABNORMAL HIGH (ref 3.5–5.1)
Sodium: 132 mmol/L — ABNORMAL LOW (ref 135–145)

## 2022-06-23 LAB — HEMOGLOBIN A1C
Hgb A1c MFr Bld: 5.6 % (ref 4.8–5.6)
Mean Plasma Glucose: 114.02 mg/dL

## 2022-06-23 LAB — MAGNESIUM: Magnesium: 1.6 mg/dL — ABNORMAL LOW (ref 1.7–2.4)

## 2022-06-23 LAB — GLUCOSE, CAPILLARY: Glucose-Capillary: 134 mg/dL — ABNORMAL HIGH (ref 70–99)

## 2022-06-23 MED ORDER — GLYCOPYRROLATE 0.2 MG/ML IJ SOLN: 0.2000 mg | INTRAMUSCULAR | Status: DC | PRN

## 2022-06-23 MED ORDER — HALOPERIDOL LACTATE 2 MG/ML PO CONC: 0.5000 mg | ORAL | Status: DC | PRN

## 2022-06-23 MED ORDER — HALOPERIDOL LACTATE 5 MG/ML IJ SOLN: 0.5000 mg | INTRAMUSCULAR | Status: DC | PRN

## 2022-06-23 MED ORDER — ONDANSETRON HCL 4 MG/2ML IJ SOLN: 4.0000 mg | Freq: Four times a day (QID) | INTRAMUSCULAR | Status: DC | PRN

## 2022-06-23 MED ORDER — ONDANSETRON 4 MG PO TBDP: 4.0000 mg | ORAL_TABLET | Freq: Four times a day (QID) | ORAL | Status: DC | PRN

## 2022-06-23 MED ORDER — FUROSEMIDE 10 MG/ML IJ SOLN: 40.0000 mg | Freq: Once | INTRAMUSCULAR | Status: DC

## 2022-06-23 MED ORDER — POLYVINYL ALCOHOL 1.4 % OP SOLN: 1.0000 [drp] | Freq: Four times a day (QID) | OPHTHALMIC | Status: DC | PRN

## 2022-06-23 MED ORDER — MAGNESIUM SULFATE IN D5W 1-5 GM/100ML-% IV SOLN
1.0000 g | Freq: Once | INTRAVENOUS | Status: AC
  Administered 2022-06-23: 1 g via INTRAVENOUS
  Filled 2022-06-23: qty 100

## 2022-06-23 MED ORDER — SODIUM ZIRCONIUM CYCLOSILICATE 10 G PO PACK
10.0000 g | PACK | Freq: Once | ORAL | Status: AC
  Administered 2022-06-23: 10 g via ORAL
  Filled 2022-06-23: qty 1

## 2022-06-23 MED ORDER — HALOPERIDOL 0.5 MG PO TABS: 0.5000 mg | ORAL_TABLET | ORAL | Status: DC | PRN

## 2022-06-23 MED ORDER — BIOTENE DRY MOUTH MT LIQD: 15.0000 mL | OROMUCOSAL | Status: DC | PRN

## 2022-06-23 MED ORDER — SODIUM CHLORIDE 0.9 % IV SOLN: 1.0000 g | INTRAVENOUS | Status: DC

## 2022-06-23 MED ORDER — ACETAMINOPHEN 325 MG PO TABS: 650.0000 mg | ORAL_TABLET | Freq: Four times a day (QID) | ORAL | Status: DC | PRN

## 2022-06-23 MED ORDER — GLYCOPYRROLATE 1 MG PO TABS: 1.0000 mg | ORAL_TABLET | ORAL | Status: DC | PRN

## 2022-06-23 MED ORDER — MAGNESIUM SULFATE 4 GM/100ML IV SOLN: 4.0000 g | Freq: Once | INTRAVENOUS | Status: DC

## 2022-06-23 MED ORDER — HYDROMORPHONE HCL-NACL 50-0.9 MG/50ML-% IV SOLN
1.0000 mg/h | INTRAVENOUS | Status: DC
  Administered 2022-06-23: 1 mg/h via INTRAVENOUS

## 2022-06-23 MED ORDER — HYDROMORPHONE BOLUS VIA INFUSION: 1.0000 mg | INTRAVENOUS | Status: DC | PRN

## 2022-06-23 MED ORDER — ACETAMINOPHEN 650 MG RE SUPP: 650.0000 mg | Freq: Four times a day (QID) | RECTAL | Status: DC | PRN

## 2022-07-17 NOTE — Progress Notes (Signed)
Attempted to place pt on V60, pt tolerated it for 15-20 minutes before trying to remove mask, placed back on salter 7 lpm

## 2022-07-17 NOTE — Progress Notes (Signed)
Wasted IV dilaudid with Levada Dy, RN in Education officer, environmental.  Hinton Dyer, RN

## 2022-07-17 NOTE — Progress Notes (Signed)
Pt placed on bipap for WOB and oxygenation. Family OTW, pt appears to be near end of life. RN at bedside and MD paged.

## 2022-07-17 NOTE — Progress Notes (Signed)
Pt's BP @ 0409 was 89/59 MAP 68, stopped Cardizem gtt. Pt's K+ was 5.4. Attempted to notify Joneen Roach, MD. Will continue to monitor.  Bari Edward, RN

## 2022-07-17 NOTE — Progress Notes (Addendum)
   Palliative Medicine Inpatient Follow Up Note   The PMT has acknowledged the consultation for [employer].  Have reached out to the primary Hospitalist, Dr. Ella Jubilee via secure chat.   At this time consultation will be held due to already clarified goals and established comfort measures.  The PMT will remain available if additional need arise.  No Charge ______________________________________________________________________________________ Brett Saunders Palliative Medicine Team Team Cell Phone: [phone] Please utilize secure chat with additional questions, if there is no response within 30 minutes please call the above phone number  Palliative Medicine Team providers are available by phone from 7am to 7pm daily and can be reached through the team cell phone.  Should this patient require assistance outside of these hours, please call the patient's attending physician.

## 2022-07-17 NOTE — Plan of Care (Signed)
  Problem: Education: Goal: Ability to demonstrate management of disease process will improve Outcome: Progressing Goal: Ability to verbalize understanding of medication therapies will improve Outcome: Progressing Goal: Individualized Educational Video(s) Outcome: Progressing   Problem: Activity: Goal: Capacity to carry out activities will improve Outcome: Progressing   Problem: Cardiac: Goal: Ability to achieve and maintain adequate cardiopulmonary perfusion will improve Outcome: Progressing   Problem: Education: Goal: Knowledge of disease or condition will improve Outcome: Progressing Goal: Knowledge of the prescribed therapeutic regimen will improve Outcome: Progressing Goal: Individualized Educational Video(s) Outcome: Progressing   Problem: Activity: Goal: Ability to tolerate increased activity will improve Outcome: Progressing Goal: Will verbalize the importance of balancing activity with adequate rest periods Outcome: Progressing   Problem: Respiratory: Goal: Ability to maintain a clear airway will improve Outcome: Progressing Goal: Levels of oxygenation will improve Outcome: Progressing Goal: Ability to maintain adequate ventilation will improve Outcome: Progressing   Problem: Education: Goal: Knowledge of disease or condition will improve Outcome: Progressing Goal: Understanding of medication regimen will improve Outcome: Progressing Goal: Individualized Educational Video(s) Outcome: Progressing   Problem: Activity: Goal: Ability to tolerate increased activity will improve Outcome: Progressing   Problem: Cardiac: Goal: Ability to achieve and maintain adequate cardiopulmonary perfusion will improve Outcome: Progressing   Problem: Health Behavior/Discharge Planning: Goal: Ability to safely manage health-related needs after discharge will improve Outcome: Progressing   Problem: Education: Goal: Knowledge of General Education information will  improve Description: Including pain rating scale, medication(s)/side effects and non-pharmacologic comfort measures Outcome: Progressing   Problem: Health Behavior/Discharge Planning: Goal: Ability to manage health-related needs will improve Outcome: Progressing   Problem: Clinical Measurements: Goal: Ability to maintain clinical measurements within normal limits will improve Outcome: Progressing Goal: Will remain free from infection Outcome: Progressing Goal: Diagnostic test results will improve Outcome: Progressing Goal: Respiratory complications will improve Outcome: Progressing Goal: Cardiovascular complication will be avoided Outcome: Progressing   Problem: Activity: Goal: Risk for activity intolerance will decrease Outcome: Progressing   Problem: Nutrition: Goal: Adequate nutrition will be maintained Outcome: Progressing   Problem: Coping: Goal: Level of anxiety will decrease Outcome: Progressing   Problem: Elimination: Goal: Will not experience complications related to bowel motility Outcome: Progressing Goal: Will not experience complications related to urinary retention Outcome: Progressing   Problem: Pain Managment: Goal: General experience of comfort will improve Outcome: Progressing   Problem: Safety: Goal: Ability to remain free from injury will improve Outcome: Progressing   Problem: Skin Integrity: Goal: Risk for impaired skin integrity will decrease Outcome: Progressing   Problem: Education: Goal: Ability to describe self-care measures that may prevent or decrease complications (Diabetes Survival Skills Education) will improve Outcome: Progressing Goal: Individualized Educational Video(s) Outcome: Progressing   Problem: Coping: Goal: Ability to adjust to condition or change in health will improve Outcome: Progressing   Problem: Fluid Volume: Goal: Ability to maintain a balanced intake and output will improve Outcome: Progressing    Problem: Health Behavior/Discharge Planning: Goal: Ability to identify and utilize available resources and services will improve Outcome: Progressing Goal: Ability to manage health-related needs will improve Outcome: Progressing   Problem: Metabolic: Goal: Ability to maintain appropriate glucose levels will improve Outcome: Progressing   Problem: Nutritional: Goal: Maintenance of adequate nutrition will improve Outcome: Progressing Goal: Progress toward achieving an optimal weight will improve Outcome: Progressing   Problem: Skin Integrity: Goal: Risk for impaired skin integrity will decrease Outcome: Progressing   Problem: Tissue Perfusion: Goal: Adequacy of tissue perfusion will improve Outcome: Progressing

## 2022-07-17 NOTE — Progress Notes (Signed)
Patient stopped breathing when his wife, daughter, and granddaughters were at bedside. Pt was on dilaudid drip for comfort. Pronounced at 1525 by two RNs.Hinton Dyer, RN

## 2022-07-17 NOTE — Progress Notes (Signed)
Pt began having more frequent episodes of sustained V-tach & ectopy. Pt stated that he was not having any chest pain or shortness of breath. Attempted to notify Crosley, MD & permission was given to administer PO Amiodarone dose early. Magnesium levels were checked & Magnesium IVPB ordered.   3496: Pt was still having prolonged episodes of sustained V-tach, 12-lead EKG obtained, & attempted to notify Arrien, MD.

## 2022-07-17 NOTE — Progress Notes (Addendum)
Patient with worsening mentation and continue to have respiratory distress.  His family is at the bedside. Considering is rapidly worsening condition, respiratory distress and advance directive, his family is in agreement to concentrate on comfort care. Will transition to full comfort care. Due to significant respiratory distress will start patient on hydromorphone infusion.   Septic/ cardiogenic shock with multiorgan failure not present on admission.

## 2022-07-17 NOTE — Progress Notes (Signed)
Progress Note   Patient: Brett Saunders. OMB:[id number] DOB: [birth year] DOA: 06/28/2022     1 DOS: the patient was seen and examined on July 01, 2022   Brief hospital course: Brett Saunders was admitted to the hospital with the working diagnosis of COPD exacerbation.   77 yo male with the past medical history of COPD, chronic hypoxemic respiratory failure, heart failure, atrial fibrillation and peripheral vascular disease who presented with dyspnea. Reported a rapid worsening of dyspnea over last 12 hrs, that prompted him to come to the ED, on his initial physical examination his RR was 36, HR 147, blood pressure 108/78, and 02 saturation 98% on 8 L/min per Brett Saunders, lungs with increased work of breathing, prolonged expiratory phase and bilateral scattered rhonchi, heart with S1 and S2 present, tachycardic, irregularly irregular, abdomen with no distention, with no lower extremity edema.   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   EKG 135 bpm, normal axis, qtc 522, interventricular conduction delay, atrial fibrillation with no significant ST segment or T wave changes, positive LVH.   Patient was placed on Bipap, steroids and diuretics.  Diltiazem drip for rate control.   04/06 trial off Bipap, to [place]. Patient with advance emphysema and poor prognosis, code status changed to DNR.  01-Jul-2022 continue worsening 02 requirements, follow up chest radiograph with right lower lobe and right upper lobe pneumonia (likely present on admission).   Assessment and Plan: * Community acquired pneumonia of right lower lobe of lung COPD exacerbation.  Acute on chronic hypoxemic and hypercapnic respiratory failure.  Severe sepsis.   He has declined to use Bipap, code status has been changed to DNR. Overnight with increased 02 requirements up to 10 L with 02 saturation 89%.  Chest  radiograph with right lower and right upper lobe infiltrates, more evident today, consistent with community acquired pneumonia, likely present on admission.  Wbc is 19,3  Patient has been on doxycycline, will broad antibiotic therapy to IV ceftriaxone.   Continue systemic steroids with methylprednisolone.  Bronchodilator therapy with duoneb scheduled and as needed. Inhaled corticosteroids and airway clearing techniques.   Atrial fibrillation with RVR Patient developed bradycardia, and diltiazem drip has been discontinued.  Telemetry with persistent atrial fibrillation, he had episodic irregularly irregular wide complex tachycardia, likely a fib with aberrancy.  Plan to continue oral amiodarone, and continue telemetry monitoring. If recurrent RVR will transition to amiodarone drip.   Acute on chronic diastolic CHF (congestive heart failure) Echocardiogram from 2020, with preserved LV and RV systolic function with no significant valvular disease.  Now that heart rate has improved will repeat echocardiogram.    Hold on furosemide for now due to risk of hypotension.  Continue close blood pressure monitoring, today systolic is documented 98 to 104 mmHg.   PAD (peripheral artery disease) Continue blood pressure monitoring.   AKI (acute kidney injury) Hyponatremia, hyperkalemia.  Renal function with worsening serum cr at 1,73 with K at 5,4 and serum bicarbonate at 33. Na is 132 and Mg 1,2.   This am had sodium zirconium. Add 4 g mag sulfate, Follow up renal function in am.          Subjective: patient with significant deconditioned, positive increase work of breathing and respiratory distress.   Physical Exam: Vitals:   2022-07-01 0409 Jul 01, 2022 0439 2022-07-01 0748 July 01, 2022 0758  BP: (!) 89/59 (!) 87/70  96/69  Pulse: (!) 49 (!) 109 (!) 113 (!) 104  Resp: (!) 33 (!) 22 (!) 28 (!) 22  Temp:    97.8 F (36.6 C)  TempSrc:    Axillary  SpO2: (!) 78% 91% (!) 89% 99%  Weight:  49.9  kg    Height:       Neurology awake, deconditioned and ill looking appearing  ENT with positive pallor Cardiovascular with S1 and S2 present irregularly irregular with no gallops, or murmurs Respiratory with rales and rhonchi, prolonged expiratory phase and use of accessory muscles, speaking in short sentences Abdomen with no distention  No lower extremity edema  Data Reviewed:    Family Communication: no family at the bedside   Disposition: Status is: Inpatient Remains inpatient appropriate because: critically ill   Planned Discharge Destination: to be determined.     Author: Coralie Keens, MD 06/18/2022 11:19 AM  For on call review [email or web address].

## 2022-07-17 NOTE — Progress Notes (Signed)
Called his family and informed he is having more difficulty breathing and not feeling well.  His wife, daughter and granddaughter will be here in about 1.5 hours. Patient made aware.  Hinton Dyer, RN

## 2022-07-17 NOTE — Progress Notes (Signed)
   07/08/2022 1306  Spiritual Encounters  Type of Visit Initial  Care provided to: Pt and family  Conversation partners present during encounter Nurse  Referral source Nurse (RN/NT/LPN)  Reason for visit Religious ritual  OnCall Visit Yes   Responded to page and consult. Per nurse patient has been moved to comfort care and not expected to "makr it thru the night". Wife and children at bedside of patient. Was advised that they would like a priest rather than a chaplain. They are not affiliated with any local catholic church. Advised that I will call for a priest from [place] to see if we can get a priest for them.  Called and left messages for both Father Ree Kida and Father Minerva Areola, awaiting a return call.

## 2022-07-17 NOTE — Death Summary Note (Signed)
DEATH SUMMARY   Patient Details  Name: Brett Saunders. MRN: [id number] DOB: May 04, 1945 KPT:[id number], Prince Solian, DO Admission/Discharge Information   Admit Date:  06-28-2022  Date of Death: Date of Death: 06-30-22  Time of Death: Time of Death: Jul 15, 1523  Length of Stay: 1   Principle Cause of death: pneumonia, hypoxemic respiratory failure, shock (cardiogenic/ septic).                                                Hospital Diagnoses: Principal Problem:   Community acquired pneumonia of right lower lobe of lung Active Problems:   Atrial fibrillation with RVR   Acute on chronic diastolic CHF (congestive heart failure)   PAD (peripheral artery disease)   AKI (acute kidney injury)   Hospital Course: Mr. Brett Saunders was admitted to the hospital with the working diagnosis of COPD exacerbation.   77 yo male with the past medical history of COPD, chronic hypoxemic respiratory failure, heart failure, atrial fibrillation and peripheral vascular disease who presented with dyspnea. Reported a rapid worsening of dyspnea over last 12 hrs, that prompted him to come to the ED, on his initial physical examination his RR was 36, HR 147, blood pressure 108/78, and 02 saturation 98% on 8 L/min per [place], lungs with increased work of breathing, prolonged expiratory phase and bilateral scattered rhonchi, heart with S1 and S2 present, tachycardic, irregularly irregular, abdomen with no distention, with no lower extremity edema.   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   EKG 135 bpm, normal axis, qtc 522, interventricular conduction delay, atrial fibrillation with no significant ST segment or T wave changes, positive LVH.   Patient was placed on Bipap, steroids and diuretics.  Diltiazem drip for rate control.   04/06 trial off Bipap, to [place]. Patient with advance  emphysema and poor prognosis, code status changed to DNR.  06/30/2022 continue worsening 02 requirements, follow up chest radiograph with right lower lobe and right upper lobe pneumonia (likely present on admission).   Assessment and Plan: * Community acquired pneumonia of right lower lobe of lung COPD exacerbation.  Acute on chronic hypoxemic and hypercapnic respiratory failure.  Severe sepsis.   He has declined to use Bipap, code status has been changed to DNR. Overnight with increased 02 requirements up to 10 L with 02 saturation 89%.  Chest radiograph with right lower and right upper lobe infiltrates, more evident today, consistent with community acquired pneumonia, likely present on admission.  Wbc is 19,3  Patient has been on doxycycline, will broad antibiotic therapy to IV ceftriaxone.   Continue systemic steroids with methylprednisolone.  Bronchodilator therapy with duoneb scheduled and as needed. Inhaled corticosteroids and airway clearing techniques.   Atrial fibrillation with RVR Patient developed bradycardia, and diltiazem drip has been discontinued.  Telemetry with persistent atrial fibrillation, he had episodic irregularly irregular wide complex tachycardia, likely a fib with aberrancy.  Plan to continue oral amiodarone, and continue telemetry monitoring. If recurrent RVR will transition to amiodarone drip.   Acute on chronic diastolic CHF (congestive heart failure) Echocardiogram from 2020, with preserved LV and RV systolic function with no significant valvular disease.  Now that heart rate has improved will repeat echocardiogram.    Hold on furosemide for  now due to risk of hypotension.  Continue close blood pressure monitoring, today systolic is documented 98 to 104 mmHg.   PAD (peripheral artery disease) Continue blood pressure monitoring.   AKI (acute kidney injury) Hyponatremia, hyperkalemia.  Renal function with worsening serum cr at 1,73 with K at 5,4 and serum  bicarbonate at 33. Na is 132 and Mg 1,2.   This am had sodium zirconium. Add 4 g mag sulfate, Follow up renal function in am.            The results of significant diagnostics from this hospitalization (including imaging, microbiology, ancillary and laboratory) are listed below for reference.   Significant Diagnostic Studies: DG Chest 1 View  Result Date: 07/07/2022 CLINICAL DATA:  Follow-up cardiopulmonary status. EXAM: CHEST  1 VIEW COMPARISON:  03/01/23 FINDINGS: Patient is slightly rotated to the right. Dual lead left pacemaker unchanged. Lungs are adequately inflated with interval worsening patchy bilateral airspace process right worse than left likely infection. No significant effusion or pneumothorax. Cardiomediastinal silhouette and remainder of the exam is unchanged. IMPRESSION: Interval worsening patchy bilateral airspace process right worse than left likely infection. Electronically Signed   By: Elberta Fortisaniel  Boyle M.D.   On: 07/11/2022 10:15   DG Chest Portable 1 View  Result Date: 06/18/2022 CLINICAL DATA:  Shortness of breath EXAM: PORTABLE CHEST 1 VIEW COMPARISON:  11/12/2021 FINDINGS: Cardiac shadow is stable. Defibrillator is again noted. Lungs are hyperinflated. Mild left basilar scarring is again noted. No focal infiltrate is seen. Mild chronic fibrotic changes are seen. No acute bony abnormality is noted. IMPRESSION: Stable scarring in the left base. No acute abnormality noted. Electronically Signed   By: Alcide CleverMark  Lukens M.D.   On: 03/01/23 20:46   CUP PACEART INCLINIC DEVICE CHECK  Result Date: 06/04/2022 Wound check appointment. Steri-strips removed. Wound without redness or edema. Incision edges approximated, wound well healed. Normal device function. Sensing, and impedances consistent with implant measurements. Device programmed for chronic lead safety  margins as patient was a gen change only. Patient has been in AF persistently since 3/11 and frequently since implant.   He has significant SOB and weakness with very poor ventricular rate control.  Presenting shows rapid ventricular response, i was unable to perform RV threshold testing due v rates up to 150bpm with patient at rest.   Reviewed with Dr. Graciela HusbandsKlein who had me move patient to his schedule in order to evaluate patient further.   [facility]. Graciela HusbandsKlein orders the following for patient: 1.  increase amiodarone to 400mg  bid X 2 weeks (he had been decreased to 200mg  1/2 pill only while ruling out lung toxicity.  Per patient that was ruled out but amio was never increased back - he was unsure why. 2.  BP today is 133/72, Metoprolol also increased from 12.5mg  daily to 25mg  daily. 3. [facility] is [facility] tomorrow and seek immediate follow up and consideration for a cardioversion. Patient educated about wound care, arm mobility, lifting restrictions, shock plan. ROV in 3 months with implanting physician.  Also, given device clinic number to keep us updated.  New RX sent in to [facility] pharmacy [place] for metoprolol and Amiodarone at patient's request. Printout with patient's medication changes provided.  Patient verbalizes understanding of changes made today. See scanned in attachment for full details.Syliva OvermanMandy Wagoner, RN   Microbiology: Recent Results (from the past 240 hour(s))  Resp panel by RT-PCR (RSV, Flu A&B, Covid) Anterior Nasal Swab  Status: None   Collection Time: 07/01/2022  8:25 PM   Specimen: Anterior Nasal Swab  Result Value Ref Range Status   SARS Coronavirus 2 by RT PCR NEGATIVE NEGATIVE Final   Influenza A by PCR NEGATIVE NEGATIVE Final   Influenza B by PCR NEGATIVE NEGATIVE Final    Comment: (NOTE) The Xpert Xpress SARS-CoV-2/FLU/RSV plus assay is intended as an aid in the diagnosis of influenza from Nasopharyngeal swab specimens and should not be used as a sole basis for treatment. Nasal washings and aspirates are unacceptable for Xpert Xpress  SARS-CoV-2/FLU/RSV testing.  Fact Sheet for Patients: BloggerCourse.com  Fact Sheet for Healthcare Providers: SeriousBroker.it  This test is not yet approved or cleared by the [place] FDA and has been authorized for detection and/or diagnosis of SARS-CoV-2 by FDA under an Emergency Use Authorization (EUA). This EUA will remain in effect (meaning this test can be used) for the duration of the COVID-19 declaration under Section 564(b)(1) of the Act, 21 U.S.C. section 360bbb-3(b)(1), unless the authorization is terminated or revoked.     Resp Syncytial Virus by PCR NEGATIVE NEGATIVE Final    Comment: (NOTE) Fact Sheet for Patients: BloggerCourse.com  Fact Sheet for Healthcare Providers: SeriousBroker.it  This test is not yet approved or cleared by the [place] FDA and has been authorized for detection and/or diagnosis of SARS-CoV-2 by FDA under an Emergency Use Authorization (EUA). This EUA will remain in effect (meaning this test can be used) for the duration of the COVID-19 declaration under Section 564(b)(1) of the Act, 21 U.S.C. section 360bbb-3(b)(1), unless the authorization is terminated or revoked.  Performed at [facility], [address], [place], [place] [address]       Signed: Coralie Keens, MD

## 2022-07-17 DEATH — deceased

## 2022-08-16 ENCOUNTER — Encounter: Payer: No Typology Code available for payment source | Admitting: Cardiovascular Disease
